# Patient Record
Sex: Female | Born: 1970 | Race: White | Hispanic: No | Marital: Married | Smoking: Current every day smoker
Health system: Southern US, Community
[De-identification: ages and names within clinical notes are randomized; demographics above are authoritative.]

## PROBLEM LIST (undated history)

## (undated) DIAGNOSIS — M5430 Sciatica, unspecified side: Secondary | ICD-10-CM

## (undated) DIAGNOSIS — M1990 Unspecified osteoarthritis, unspecified site: Secondary | ICD-10-CM

## (undated) DIAGNOSIS — I219 Acute myocardial infarction, unspecified: Secondary | ICD-10-CM

## (undated) DIAGNOSIS — F319 Bipolar disorder, unspecified: Secondary | ICD-10-CM

## (undated) DIAGNOSIS — F410 Panic disorder [episodic paroxysmal anxiety] without agoraphobia: Secondary | ICD-10-CM

## (undated) DIAGNOSIS — J45909 Unspecified asthma, uncomplicated: Secondary | ICD-10-CM

## (undated) DIAGNOSIS — J449 Chronic obstructive pulmonary disease, unspecified: Secondary | ICD-10-CM

## (undated) HISTORY — PX: TONSILLECTOMY: SUR1361

## (undated) HISTORY — DX: Acute myocardial infarction, unspecified: I21.9

## (undated) HISTORY — PX: TUBAL LIGATION: SHX77

## (undated) HISTORY — DX: Unspecified osteoarthritis, unspecified site: M19.90

## (undated) HISTORY — PX: OTHER SURGICAL HISTORY: SHX169

---

## 2009-07-02 ENCOUNTER — Ambulatory Visit: Payer: Self-pay | Admitting: Orthopedic Surgery

## 2009-07-02 DIAGNOSIS — M549 Dorsalgia, unspecified: Secondary | ICD-10-CM | POA: Insufficient documentation

## 2009-07-02 DIAGNOSIS — M5430 Sciatica, unspecified side: Secondary | ICD-10-CM

## 2009-07-09 ENCOUNTER — Encounter (HOSPITAL_COMMUNITY): Admission: RE | Admit: 2009-07-09 | Discharge: 2009-08-08 | Payer: Self-pay | Admitting: Orthopedic Surgery

## 2009-07-09 ENCOUNTER — Encounter: Payer: Self-pay | Admitting: Orthopedic Surgery

## 2009-08-19 ENCOUNTER — Encounter (HOSPITAL_COMMUNITY): Admission: RE | Admit: 2009-08-19 | Discharge: 2009-09-18 | Payer: Self-pay | Admitting: Orthopedic Surgery

## 2009-09-04 ENCOUNTER — Encounter: Payer: Self-pay | Admitting: Orthopedic Surgery

## 2010-05-03 DIAGNOSIS — I219 Acute myocardial infarction, unspecified: Secondary | ICD-10-CM

## 2010-05-03 HISTORY — DX: Acute myocardial infarction, unspecified: I21.9

## 2010-06-02 NOTE — Letter (Signed)
Summary: *Orthopedic Consult Note  [facility]  [address]  [place], [place] [address]   Phone: [phone]  Fax: [phone]    Re:    Nichole [name] DOB:    12-19-1970   Dear: Dr. Dimas Aguas and the health department   Thank you for requesting that we see the above patient for consultation.  A copy of the detailed office note will be sent under separate cover, for your review.  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  No surgical treatment needed.       Thank you for this opportunity to look after your patient.  Sincerely,   Terrance Mass. MD.

## 2010-06-02 NOTE — Miscellaneous (Addendum)
Summary: Discharge from PT  Discharage from PT   Imported By: Jacklynn Ganong 09/10/2009 15:31:34  _____________________________________________________________________  External Attachment:    Type:   Image     Comment:   External Document

## 2010-06-02 NOTE — Miscellaneous (Signed)
Summary: PT Initial evaluation  PT Initial evaluation   Imported By: Jacklynn Ganong 07/15/2009 14:13:21  _____________________________________________________________________  External Attachment:    Type:   Image     Comment:   External Document

## 2010-06-02 NOTE — Miscellaneous (Signed)
Summary: PT order  PT order   Imported By: Cammie Sickle 08/25/2009 18:16:04  _____________________________________________________________________  External Attachment:    Type:   Image     Comment:   External Document

## 2010-06-02 NOTE — Assessment & Plan Note (Signed)
Summary: LOW BACK PAIN/NEEDS XRAY/REF HEALTH [name]/[place] MED[place]   Vital Signs:  Patient profile:   40 year old female Weight:      153 pounds Pulse rate:   78 / minute Resp:     18 per minute  Visit Type:  Initial Consult Referring Provider:  Dr. Caryn Bee Howard/health department  CC:  back pain.  History of Present Illness: 40 year old female presents with a five-year history of back pain.  She started having back pain after her tubal ligation in 1995 at which time general spinal anesthetic.  She is numbness and tingling in her RIGHT leg when she crosses her RIGHT leg.  It seems like her feet go to sleep.  She complains of sharp throbbing stabbing burning moderate pain, 7/10, which comes and goes it is worse at night, worse when sitting and worse when standing.  Onset was gradual.  She tried gritty powders BC powders for pain doesn't seem to help.  She does have some catching and locking sensation in the back with numbness and tingling in the leg.  Red flags such as bowel bladder dysfunction are denied.  No fever night sweats or weight loss.   Medications: Lexapro 10mg , Vistaril 50mg .    Past History:  Past Medical History: Anxiety Depression Migraines Bipolar Cyst on right wrist  Past Surgical History: C-section Cyst on right wrist Tonsillectomy Tubal  Review of Systems Constitutional:  Complains of weight gain and fatigue; denies weight loss, fever, and chills. Cardiovascular:  Denies chest pain, palpitations, fainting, and murmurs. Respiratory:  Complains of short of breath and couch; denies wheezing, tightness, pain on inspiration, and snoring . Gastrointestinal:  Denies heartburn, nausea, vomiting, diarrhea, constipation, and blood in your stools. Genitourinary:  Denies frequency, urgency, difficulty urinating, painful urination, flank pain, and bleeding in urine. Neurologic:  Denies numbness, tingling, unsteady gait, dizziness, tremors, and  seizure. Musculoskeletal:  Complains of joint pain and stiffness; denies swelling, instability, redness, heat, and muscle pain. Endocrine:  Denies excessive thirst, exessive urination, and heat or cold intolerance. Psychiatric:  Complains of nervousness, depression, and anxiety; denies hallucinations. Skin:  Denies changes in the skin, poor healing, rash, itching, and redness. HEENT:  Complains of blurred or double vision; denies eye pain, redness, and watering; headache, ears ringing. Immunology:  Complains of seasonal allergies; denies sinus problems and allergic to bee stings. Hemoatologic:  Denies easy bleeding and brusing.  Physical Exam  Additional Exam:   VS reviewed and were normal  GEN: appearance was normal   CDV: normal pulses temperature and no edema  LYMPH nodes were normal   SKIN was normal   Neuro: deep tendon reflexes are 2+ at the knees and at the ankles.  She had decreased sensation in the RIGHT foot compared to the LEFT.  Coordination balance were normal.   Psyche: AAO x 3 and mood was normal   MSKspinal exam  Normal alignment was noted in the spine and the spine was nontender from the cervical until the thoracic region and into the L1-L3 interspaces and then there is tenderness across the lower back at the L4-L5 interspace and on each side of the spine  Range of motion was normal there is no instability  She seemed to have a positive straight leg raise RIGHT compared to LEFT this was a 45 with tension sign  Hip motion was restricted on the RIGHT in terms of flexion she complained of some pain and I did an x-ray which was negative  Normal hip motion on  the LEFT  Knees and ankles and motion was otherwise normal strength was excellent in the lower extremities   *Gait was normal  *Inspection  *ROM *Motor *Stability     Impression & Recommendations:  Problem # 1:  SCIATICA (ICD-724.3)  Her updated medication list for this problem includes:    Aleve  220 Mg Tabs (Naproxen sodium) .Marland Kitchen... 1 by mouth bid  Orders: New Patient Level III (72536) Pelvis x-ray, 1/2 views (72170) Lumbosacral Spine ,2/3 views (72100)  Problem # 2:  BACK PAIN, CHRONIC (ICD-724.5)  Her updated medication list for this problem includes:    Aleve 220 Mg Tabs (Naproxen sodium) .Marland Kitchen... 1 by mouth bid  Orders: New Patient Level III (64403) Pelvis x-ray, 1/2 views (47425) Lumbosacral Spine ,2/3 views (72100) x-ray reports  X-ray of the lumbar spine was ordered  The findings are mild asymmetry in the trunk and the lumbar region Alda spaces were opened there is normal lumbar lordosis and there was no fracture  Impression mild truncal asymmetry lumbar spine otherwise normal  AP pelvis was ordered to evaluate the RIGHT hip  The findings were normal hip joints  Impression normal hips  Medications Added to Medication List This Visit: 1)  Prednisone (pak) 10 Mg Tabs (Prednisone) .... As directed 2)  Aleve 220 Mg Tabs (Naproxen sodium) .Marland Kitchen.. 1 by mouth bid  Patient Instructions: 1)  Most patients (90%) of patients with low back pain will improve with time (2-6 weeks). Limit activity to comfort and avoid activities that increase discomfort.  Apply moist heat and/or ice to lower back and take medication as instructed for pain relief. Please read the Back Pain Handout and start Physical Therapy as directed.  2)  Take the prednisone dose pack fopr 2 weeks then start 1 aleve two times a day for 6 weeks  3)  if you have any further problems, see your doctor at the Health dept.  Prescriptions: ALEVE 220 MG TABS (NAPROXEN SODIUM) 1 by mouth bid  #otc x 0   Entered and Authorized by:   Fuller Canada MD   Signed by:   Fuller Canada MD on 07/02/2009   Method used:   Historical   RxID:   [id number] PREDNISONE (PAK) 10 MG TABS (PREDNISONE) as directed  #1 x 1   Entered and Authorized by:   Fuller Canada MD   Signed by:   Fuller Canada MD on 07/02/2009    Method used:   Print then Give to Patient   RxID:   [id number]

## 2010-06-02 NOTE — Medication Information (Signed)
Summary: Strepred dose pack prescription  Strepred dose pack prescription   Imported By: Jacklynn Ganong 07/07/2009 10:56:37  _____________________________________________________________________  External Attachment:    Type:   Image     Comment:   External Document

## 2010-06-02 NOTE — Letter (Signed)
Summary: History form  History form   Imported By: Jacklynn Ganong 07/08/2009 09:51:48  _____________________________________________________________________  External Attachment:    Type:   Image     Comment:   External Document

## 2012-11-14 ENCOUNTER — Emergency Department (HOSPITAL_COMMUNITY): Payer: Self-pay

## 2012-11-14 ENCOUNTER — Emergency Department (HOSPITAL_COMMUNITY)
Admission: EM | Admit: 2012-11-14 | Discharge: 2012-11-14 | Disposition: A | Discharge status: Home / Self Care | Payer: Self-pay | Attending: Emergency Medicine | Admitting: Emergency Medicine

## 2012-11-14 ENCOUNTER — Encounter (HOSPITAL_COMMUNITY): Payer: Self-pay

## 2012-11-14 DIAGNOSIS — Y9301 Activity, walking, marching and hiking: Secondary | ICD-10-CM | POA: Insufficient documentation

## 2012-11-14 DIAGNOSIS — S2231XA Fracture of one rib, right side, initial encounter for closed fracture: Secondary | ICD-10-CM

## 2012-11-14 DIAGNOSIS — Y929 Unspecified place or not applicable: Secondary | ICD-10-CM | POA: Insufficient documentation

## 2012-11-14 DIAGNOSIS — F17200 Nicotine dependence, unspecified, uncomplicated: Secondary | ICD-10-CM | POA: Insufficient documentation

## 2012-11-14 DIAGNOSIS — Z79899 Other long term (current) drug therapy: Secondary | ICD-10-CM | POA: Insufficient documentation

## 2012-11-14 DIAGNOSIS — S2239XA Fracture of one rib, unspecified side, initial encounter for closed fracture: Secondary | ICD-10-CM | POA: Insufficient documentation

## 2012-11-14 DIAGNOSIS — F319 Bipolar disorder, unspecified: Secondary | ICD-10-CM | POA: Insufficient documentation

## 2012-11-14 DIAGNOSIS — W108XXA Fall (on) (from) other stairs and steps, initial encounter: Secondary | ICD-10-CM | POA: Insufficient documentation

## 2012-11-14 HISTORY — DX: Bipolar disorder, unspecified: F31.9

## 2012-11-14 HISTORY — DX: Panic disorder (episodic paroxysmal anxiety): F41.0

## 2012-11-14 MED ORDER — HYDROCODONE-ACETAMINOPHEN 5-325 MG PO TABS
1.0000 | ORAL_TABLET | Freq: Once | ORAL | Status: AC
  Administered 2012-11-14: 1 via ORAL
  Filled 2012-11-14: qty 1

## 2012-11-14 MED ORDER — HYDROCODONE-ACETAMINOPHEN 5-325 MG PO TABS: 1.0000 | ORAL_TABLET | ORAL | Status: DC | PRN

## 2012-11-14 NOTE — ED Provider Notes (Signed)
History    CSN: [id number] Arrival date & time 11/14/12  1830  First MD Initiated Contact with Patient 11/14/12 1915     Chief Complaint  Patient presents with  . Fall   (Consider location/radiation/quality/duration/timing/severity/associated sxs/prior Treatment) Patient is a 42 y.o. female presenting with fall. The history is provided by the patient. No language interpreter was used.  Fall This is a new (Patient fell on steps about 8 AM today, injuring her right anterior rib cage. She has pain when she coughs or takes a deep breath. There is no hemoptysis and no shortness of breath.) problem. The current episode started 6 to 12 hours ago. The problem occurs constantly. The problem has not changed since onset.Nothing aggravates the symptoms. Nothing relieves the symptoms. She has tried nothing for the symptoms.   Past Medical History  Diagnosis Date  . Bipolar 1 disorder   . Panic attacks    Past Surgical History  Procedure Laterality Date  . Tonsillectomy    . Cesarean section     No family history on file. History  Substance Use Topics  . Smoking status: Current Every Day Smoker  . Smokeless tobacco: Not on file  . Alcohol Use: No   OB History   Grav Para Term Preterm Abortions TAB SAB Ect Mult Living                 Review of Systems  Constitutional: Negative for fever and chills.  Eyes: Negative.   Respiratory:       Pain in right anterior chest.  Cardiovascular: Negative.   Gastrointestinal: Negative.   Genitourinary: Negative.   Musculoskeletal: Negative.   Skin: Negative.   Neurological: Negative.   Psychiatric/Behavioral: Negative.     Allergies  Percocet  Home Medications   Current Outpatient Rx  Name  Route  Sig  Dispense  Refill  . citalopram (CELEXA) 20 MG tablet   Oral   Take 20 mg by mouth daily.         . diazepam (VALIUM) 5 MG tablet   Oral   Take 5 mg by mouth 2 (two) times daily.         Marland Kitchen HYDROcodone-acetaminophen  (NORCO/VICODIN) 5-325 MG per tablet   Oral   Take 1 tablet by mouth every 4 (four) hours as needed for pain.   20 tablet   0    BP 127/70  Pulse 101  Temp(Src) 98 F (36.7 C) (Oral)  Resp 20  Ht 5\' 8"  (1.727 m)  Wt 183 lb (83.008 kg)  BMI 27.83 kg/m2  SpO2 96%  LMP 10/31/2012 Physical Exam  Nursing note and vitals reviewed. Constitutional: She is oriented to person, place, and time. She appears well-developed and well-nourished.  In mild to moderate distress with pain in her right anterior rib cage at the costal margin.  HENT:  Head: Normocephalic and atraumatic.  Right Ear: External ear normal.  Left Ear: External ear normal.  Mouth/Throat: Oropharynx is clear and moist.  Eyes: Conjunctivae and EOM are normal. Pupils are equal, round, and reactive to light. No scleral icterus.  Neck: Normal range of motion. Neck supple.  Cardiovascular: Normal rate, regular rhythm and normal heart sounds.   Pulmonary/Chest: Effort normal and breath sounds normal. Tenderness: she localizes the pain to the right anterior chest below her right breast, along the right costal margin. There is no crepitance or subcutaneous emphysema, no palpable bony deformity. She has mild tenderness to palpation in this area.  Abdominal: She  exhibits no distension. There is no tenderness.  Musculoskeletal: Normal range of motion. She exhibits no tenderness.  Neurological: She is alert and oriented to person, place, and time.  No sensory or motor deficit.  Skin: Skin is warm and dry.  Psychiatric: She has a normal mood and affect. Her behavior is normal.    ED Course  Procedures (including critical care time) Labs Reviewed - No data to display Dg Ribs Unilateral W/chest Right  11/14/2012   *RADIOLOGY REPORT*  Clinical Data: Anterior rib pain.  Pleuritic chest pain.  Fall.  RIGHT RIBS AND CHEST - 3+ VIEW  Comparison: None.  Findings: No pneumothorax or pleural effusion observed. Cardiac and mediastinal contours  appear normal.  The lungs appear clear.  No pleural effusion is identified.  Probable nondisplaced fracture of the right tenth rib anteriorly.  IMPRESSION:  1.  Probable nondisplaced rib fracture of the right anterior 10th rib.  Otherwise negative exam.   Original Report Authenticated By: Gaylyn Rong, M.D.   Advised pt about rib fracture treatment, Rx hydrocodone-acetaminophen q4h prn pain, advised not to smoke as coughing would make her pain worse.  1. Rib fracture, right, closed, initial encounter      Carleene Cooper III, MD 11/14/12 [phone]

## 2012-11-14 NOTE — ED Notes (Signed)
Pt reports tripped walking up some stairs and fell on porch.  C/O pain to r rib area.   Says hurts to take a deep breath but denies SOB.

## 2012-11-22 ENCOUNTER — Other Ambulatory Visit (HOSPITAL_COMMUNITY): Payer: Self-pay | Admitting: *Deleted

## 2012-11-22 ENCOUNTER — Encounter (HOSPITAL_COMMUNITY): Payer: Self-pay | Admitting: Emergency Medicine

## 2012-11-22 ENCOUNTER — Emergency Department (HOSPITAL_COMMUNITY): Payer: Self-pay

## 2012-11-22 ENCOUNTER — Emergency Department (HOSPITAL_COMMUNITY)
Admission: EM | Admit: 2012-11-22 | Discharge: 2012-11-22 | Disposition: A | Discharge status: Home / Self Care | Payer: Self-pay | Attending: Emergency Medicine | Admitting: Emergency Medicine

## 2012-11-22 DIAGNOSIS — Z79899 Other long term (current) drug therapy: Secondary | ICD-10-CM | POA: Insufficient documentation

## 2012-11-22 DIAGNOSIS — S2231XS Fracture of one rib, right side, sequela: Secondary | ICD-10-CM

## 2012-11-22 DIAGNOSIS — Z760 Encounter for issue of repeat prescription: Secondary | ICD-10-CM | POA: Insufficient documentation

## 2012-11-22 DIAGNOSIS — R0789 Other chest pain: Secondary | ICD-10-CM | POA: Insufficient documentation

## 2012-11-22 DIAGNOSIS — G8921 Chronic pain due to trauma: Secondary | ICD-10-CM | POA: Insufficient documentation

## 2012-11-22 DIAGNOSIS — F410 Panic disorder [episodic paroxysmal anxiety] without agoraphobia: Secondary | ICD-10-CM | POA: Insufficient documentation

## 2012-11-22 DIAGNOSIS — R059 Cough, unspecified: Secondary | ICD-10-CM | POA: Insufficient documentation

## 2012-11-22 DIAGNOSIS — F319 Bipolar disorder, unspecified: Secondary | ICD-10-CM | POA: Insufficient documentation

## 2012-11-22 DIAGNOSIS — F17200 Nicotine dependence, unspecified, uncomplicated: Secondary | ICD-10-CM | POA: Insufficient documentation

## 2012-11-22 DIAGNOSIS — R05 Cough: Secondary | ICD-10-CM | POA: Insufficient documentation

## 2012-11-22 DIAGNOSIS — Z139 Encounter for screening, unspecified: Secondary | ICD-10-CM

## 2012-11-22 MED ORDER — HYDROCODONE-ACETAMINOPHEN 5-325 MG PO TABS
1.0000 | ORAL_TABLET | Freq: Once | ORAL | Status: AC
  Administered 2012-11-22: 1 via ORAL
  Filled 2012-11-22: qty 1

## 2012-11-22 MED ORDER — HYDROCODONE-ACETAMINOPHEN 5-325 MG PO TABS: 1.0000 | ORAL_TABLET | ORAL | Status: DC | PRN

## 2012-11-22 NOTE — ED Notes (Signed)
Pt c/o right side rib pain x1 week. Pt was seen here approximately 1 week ago after a fall and diagnosed with a rib fracture. Pt was given pain meds for home but states she is now out of her meds. Pt still reports pain and is here for medication refill.

## 2012-11-22 NOTE — ED Notes (Signed)
States that she was seen here in the emergency department and diagnosed with a broken right rib.  States that she is out of her pain medication and requires more.  States that she does not have a primary care physician.

## 2012-11-25 NOTE — ED Provider Notes (Signed)
CSN: [id number]     Arrival date & time 11/22/12  1132 History     First MD Initiated Contact with Patient 11/22/12 1141     Chief Complaint  Patient presents with  . Medication Refill   (Consider location/radiation/quality/duration/timing/severity/associated sxs/prior Treatment) HPI Comments: Nichole [name] is a 42 y.o. Female presenting with continued right rib pain after being diagnosed with a right rib fracture last week sustained in a fall against steps.  She has run out of her pain medication, hydrocodone which was improving her pain. She denies shortness of breath but continues to have pain with deep inspiration.  She does report a chronic smokers cough which is not increased since this injury, but has noticed a few episodes of pink tinged sputum. She has no fevers or chills.     The history is provided by the patient and the spouse.    Past Medical History  Diagnosis Date  . Bipolar 1 disorder   . Panic attacks    Past Surgical History  Procedure Laterality Date  . Tonsillectomy    . Cesarean section     No family history on file. History  Substance Use Topics  . Smoking status: Current Every Day Smoker  . Smokeless tobacco: Not on file  . Alcohol Use: No   OB History   Grav Para Term Preterm Abortions TAB SAB Ect Mult Living                 Review of Systems  Constitutional: Negative for fever and chills.  HENT: Negative for congestion, sore throat and neck pain.   Eyes: Negative.   Respiratory: Positive for cough. Negative for shortness of breath, wheezing and stridor.   Cardiovascular: Positive for chest pain.  Gastrointestinal: Negative for nausea and abdominal pain.  Genitourinary: Negative.   Musculoskeletal: Negative for joint swelling and arthralgias.  Skin: Negative.  Negative for wound.  Neurological: Negative for dizziness, weakness and headaches.  Psychiatric/Behavioral: Negative.     Allergies  Percocet  Home Medications   Current  Outpatient Rx  Name  Route  Sig  Dispense  Refill  . citalopram (CELEXA) 20 MG tablet   Oral   Take 20 mg by mouth daily.         . diazepam (VALIUM) 5 MG tablet   Oral   Take 5 mg by mouth 2 (two) times daily.         Marland Kitchen HYDROcodone-acetaminophen (NORCO/VICODIN) 5-325 MG per tablet   Oral   Take 1 tablet by mouth every 4 (four) hours as needed for pain.   20 tablet   0   . HYDROcodone-acetaminophen (NORCO/VICODIN) 5-325 MG per tablet   Oral   Take 1 tablet by mouth every 4 (four) hours as needed for pain.   30 tablet   0    BP 114/80  Pulse 116  Temp(Src) 97.8 F (36.6 C) (Oral)  Resp 18  Ht 5\' 8"  (1.727 m)  Wt 193 lb (87.544 kg)  BMI 29.35 kg/m2  SpO2 99%  LMP 11/16/2012 Physical Exam  Nursing note and vitals reviewed. Constitutional: She appears well-developed and well-nourished.  HENT:  Head: Normocephalic and atraumatic.  Eyes: Conjunctivae are normal.  Neck: Normal range of motion.  Cardiovascular: Normal rate, regular rhythm, normal heart sounds and intact distal pulses.   Pulmonary/Chest: Effort normal and breath sounds normal. No respiratory distress. She has no decreased breath sounds. She has no wheezes. She has no rhonchi. She has no rales. She  exhibits tenderness. She exhibits no crepitus, no deformity, no swelling and no retraction.    Abdominal: Soft. Bowel sounds are normal. There is no tenderness.  Musculoskeletal: Normal range of motion.  Neurological: She is alert.  Skin: Skin is warm and dry.  Psychiatric: She has a normal mood and affect.    ED Course   Procedures (including critical care time)  Labs Reviewed - No data to display No results found. 1. Rib fracture, right, sequela     MDM  Patients labs and/or radiological studies were viewed and considered during the medical decision making and disposition process. No evidence of developing pneumonia per xray,  Incentive spirometer given.  Hydrocodone prescribed.  Discussed  signs/symptoms for returning here including sob, fever, coughing up increased blood.  Discussed with Dr. Deretha Emory prior to pt dispo home.  Burgess Amor, PA-C 11/25/12 1123

## 2012-12-03 NOTE — ED Provider Notes (Signed)
Medical screening examination/treatment/procedure(s) were performed by non-physician practitioner and as supervising physician I was immediately available for consultation/collaboration.   Shelda Jakes, MD 12/03/12 [phone]

## 2012-12-15 ENCOUNTER — Ambulatory Visit (HOSPITAL_COMMUNITY)
Admission: RE | Admit: 2012-12-15 | Discharge: 2012-12-15 | Disposition: A | Discharge status: Home / Self Care | Payer: Self-pay | Source: Ambulatory Visit | Attending: *Deleted | Admitting: *Deleted

## 2012-12-15 DIAGNOSIS — Z139 Encounter for screening, unspecified: Secondary | ICD-10-CM

## 2012-12-19 ENCOUNTER — Other Ambulatory Visit: Payer: Self-pay | Admitting: *Deleted

## 2012-12-19 DIAGNOSIS — R928 Other abnormal and inconclusive findings on diagnostic imaging of breast: Secondary | ICD-10-CM

## 2013-01-09 ENCOUNTER — Other Ambulatory Visit: Payer: Self-pay

## 2013-03-08 ENCOUNTER — Emergency Department (HOSPITAL_COMMUNITY)
Admission: EM | Admit: 2013-03-08 | Discharge: 2013-03-08 | Disposition: A | Discharge status: Home / Self Care | Payer: Self-pay | Attending: Emergency Medicine | Admitting: Emergency Medicine

## 2013-03-08 ENCOUNTER — Encounter (HOSPITAL_COMMUNITY): Payer: Self-pay | Admitting: Emergency Medicine

## 2013-03-08 ENCOUNTER — Other Ambulatory Visit: Payer: Self-pay

## 2013-03-08 DIAGNOSIS — Z79899 Other long term (current) drug therapy: Secondary | ICD-10-CM | POA: Insufficient documentation

## 2013-03-08 DIAGNOSIS — F17200 Nicotine dependence, unspecified, uncomplicated: Secondary | ICD-10-CM | POA: Insufficient documentation

## 2013-03-08 DIAGNOSIS — X500XXA Overexertion from strenuous movement or load, initial encounter: Secondary | ICD-10-CM | POA: Insufficient documentation

## 2013-03-08 DIAGNOSIS — Z791 Long term (current) use of non-steroidal anti-inflammatories (NSAID): Secondary | ICD-10-CM | POA: Insufficient documentation

## 2013-03-08 DIAGNOSIS — S46911A Strain of unspecified muscle, fascia and tendon at shoulder and upper arm level, right arm, initial encounter: Secondary | ICD-10-CM

## 2013-03-08 DIAGNOSIS — Y929 Unspecified place or not applicable: Secondary | ICD-10-CM | POA: Insufficient documentation

## 2013-03-08 DIAGNOSIS — IMO0002 Reserved for concepts with insufficient information to code with codable children: Secondary | ICD-10-CM | POA: Insufficient documentation

## 2013-03-08 DIAGNOSIS — F410 Panic disorder [episodic paroxysmal anxiety] without agoraphobia: Secondary | ICD-10-CM | POA: Insufficient documentation

## 2013-03-08 DIAGNOSIS — F319 Bipolar disorder, unspecified: Secondary | ICD-10-CM | POA: Insufficient documentation

## 2013-03-08 DIAGNOSIS — Y9389 Activity, other specified: Secondary | ICD-10-CM | POA: Insufficient documentation

## 2013-03-08 MED ORDER — BACLOFEN 10 MG PO TABS: 10.0000 mg | ORAL_TABLET | Freq: Three times a day (TID) | ORAL | Status: DC

## 2013-03-08 MED ORDER — HYDROCODONE-ACETAMINOPHEN 5-325 MG PO TABS: ORAL_TABLET | ORAL | Status: DC

## 2013-03-08 MED ORDER — DICLOFENAC SODIUM 75 MG PO TBEC: 75.0000 mg | DELAYED_RELEASE_TABLET | Freq: Two times a day (BID) | ORAL | Status: DC

## 2013-03-08 NOTE — ED Notes (Signed)
Pain rt upper back and around to chest and rt arm. Says she has been lifting   A family  Member that is ill. And thinks that may be the cause of the pain

## 2013-03-08 NOTE — ED Provider Notes (Signed)
CSN: [id number]     Arrival date & time 03/08/13  1304 History   First MD Initiated Contact with Patient 03/08/13 1417     Chief Complaint  Patient presents with  . Back Pain   (Consider location/radiation/quality/duration/timing/severity/associated sxs/prior Treatment) HPI Comments:  Patient is a 42 year old female who presents to the emergency department with upper back pain that radiates under the right arm and into the right upper chest. The patient states that she has been helping to lift a family member who has been sick with multiple medical problems and now she is having pain in the right shoulder and side. The pain is worse with activity. Patient has not had any previous operations or procedures involving the right arm or chest area. The been no recent changes in medications. Patient denies dropping anything or having other problems.  Patient is a 42 y.o. female presenting with back pain. The history is provided by the patient.  Back Pain Associated symptoms: no abdominal pain, no chest pain and no dysuria     Past Medical History  Diagnosis Date  . Bipolar 1 disorder   . Panic attacks    Past Surgical History  Procedure Laterality Date  . Tonsillectomy    . Cesarean section     History reviewed. No pertinent family history. History  Substance Use Topics  . Smoking status: Current Every Day Smoker  . Smokeless tobacco: Not on file  . Alcohol Use: No   OB History   Grav Para Term Preterm Abortions TAB SAB Ect Mult Living                 Review of Systems  Constitutional: Negative for activity change.       All ROS Neg except as noted in HPI  HENT: Negative for nosebleeds.   Eyes: Negative for photophobia and discharge.  Respiratory: Negative for cough, shortness of breath and wheezing.   Cardiovascular: Negative for chest pain and palpitations.  Gastrointestinal: Negative for abdominal pain and blood in stool.  Genitourinary: Negative for dysuria, frequency and  hematuria.  Musculoskeletal: Positive for back pain. Negative for arthralgias and neck pain.  Skin: Negative.   Neurological: Negative for dizziness, seizures and speech difficulty.  Psychiatric/Behavioral: Negative for hallucinations and confusion. The patient is nervous/anxious.     Allergies  Percocet  Home Medications   Current Outpatient Rx  Name  Route  Sig  Dispense  Refill  . citalopram (CELEXA) 20 MG tablet   Oral   Take 20 mg by mouth daily.         . diazepam (VALIUM) 5 MG tablet   Oral   Take 5 mg by mouth daily.          . baclofen (LIORESAL) 10 MG tablet   Oral   Take 1 tablet (10 mg total) by mouth 3 (three) times daily.   21 each   0   . diclofenac (VOLTAREN) 75 MG EC tablet   Oral   Take 1 tablet (75 mg total) by mouth 2 (two) times daily.   12 tablet   0   . HYDROcodone-acetaminophen (NORCO) 5-325 MG per tablet      1 po q4h prn pain   15 tablet   0    BP 114/69  Pulse 99  Temp(Src) 97.6 F (36.4 C) (Oral)  Resp 20  Ht 5\' 8"  (1.727 m)  Wt 175 lb (79.379 kg)  BMI 26.61 kg/m2  SpO2 98%  LMP 02/26/2013 Physical Exam  Nursing note and vitals reviewed. Constitutional: She is oriented to person, place, and time. She appears well-developed and well-nourished.  Non-toxic appearance.  HENT:  Head: Normocephalic.  Right Ear: Tympanic membrane and external ear normal.  Left Ear: Tympanic membrane and external ear normal.  Eyes: EOM and lids are normal. Pupils are equal, round, and reactive to light.  Neck: Normal range of motion. Neck supple. Carotid bruit is not present.  Cardiovascular: Normal rate, regular rhythm, normal heart sounds, intact distal pulses and normal pulses.   Pulmonary/Chest: Breath sounds normal. No respiratory distress.  There is right chest wall pain to palpation. There is mild lower sternal area pain to palpation. There is no palpable deformity of the rib area or the sternal area.  Abdominal: Soft. Bowel sounds are  normal. There is no tenderness. There is no guarding.  Musculoskeletal: Normal range of motion.  There is soreness of the right shoulder to palpation and with range of motion. There is pain under the scapula. There is pain to palpation in the right flank under the axilla.  Lymphadenopathy:       Head (right side): No submandibular adenopathy present.       Head (left side): No submandibular adenopathy present.    She has no cervical adenopathy.  Neurological: She is alert and oriented to person, place, and time. She has normal strength. No cranial nerve deficit or sensory deficit.  Skin: Skin is warm and dry.  Psychiatric: She has a normal mood and affect. Her speech is normal.    ED Course  Procedures (including critical care time) Labs Review Labs Reviewed - No data to display Imaging Review No results found.  EKG Interpretation   None       MDM   1. Shoulder strain, right, initial encounter    *I have reviewed nursing notes, vital signs, and all appropriate lab and imaging results for this patient.**  Patient has been lifting a family member who is been ill recently. She now has pain under the right scapula area extending under the axilla area and into the right chest area. The lungs are clear. The pulse oximetry is within good range. There is no high fever appreciated. There's been no recent surgery or procedures involving the right chest or back.  Suspect the patient has a muscle strain involving this area. The patient will be treated with baclofen and Norco. Patient advised to rest this area for 2-3 days. Patient also advised to see the adult medicine clinic for medical care until she can establish a primary care physician.  Kathie Dike, PA-C 03/08/13 1437

## 2013-03-08 NOTE — ED Provider Notes (Signed)
Medical screening examination/treatment/procedure(s) were performed by non-physician practitioner and as supervising physician I was immediately available for consultation/collaboration.  EKG Interpretation   None         Shelda Jakes, MD 03/08/13 [phone]

## 2013-03-17 ENCOUNTER — Encounter (HOSPITAL_COMMUNITY): Payer: Self-pay | Admitting: Emergency Medicine

## 2013-03-17 ENCOUNTER — Emergency Department (HOSPITAL_COMMUNITY)
Admission: EM | Admit: 2013-03-17 | Discharge: 2013-03-17 | Disposition: A | Discharge status: Home / Self Care | Payer: Self-pay | Attending: Emergency Medicine | Admitting: Emergency Medicine

## 2013-03-17 DIAGNOSIS — F17200 Nicotine dependence, unspecified, uncomplicated: Secondary | ICD-10-CM | POA: Insufficient documentation

## 2013-03-17 DIAGNOSIS — F319 Bipolar disorder, unspecified: Secondary | ICD-10-CM | POA: Insufficient documentation

## 2013-03-17 DIAGNOSIS — M25519 Pain in unspecified shoulder: Secondary | ICD-10-CM | POA: Insufficient documentation

## 2013-03-17 DIAGNOSIS — G8911 Acute pain due to trauma: Secondary | ICD-10-CM | POA: Insufficient documentation

## 2013-03-17 DIAGNOSIS — M25511 Pain in right shoulder: Secondary | ICD-10-CM

## 2013-03-17 DIAGNOSIS — Z79899 Other long term (current) drug therapy: Secondary | ICD-10-CM | POA: Insufficient documentation

## 2013-03-17 DIAGNOSIS — F410 Panic disorder [episodic paroxysmal anxiety] without agoraphobia: Secondary | ICD-10-CM | POA: Insufficient documentation

## 2013-03-17 DIAGNOSIS — R52 Pain, unspecified: Secondary | ICD-10-CM | POA: Insufficient documentation

## 2013-03-17 MED ORDER — DIAZEPAM 5 MG PO TABS
5.0000 mg | ORAL_TABLET | Freq: Once | ORAL | Status: AC
  Administered 2013-03-17: 5 mg via ORAL
  Filled 2013-03-17: qty 1

## 2013-03-17 MED ORDER — DIAZEPAM 5 MG PO TABS: ORAL_TABLET | ORAL | Status: DC

## 2013-03-17 MED ORDER — HYDROCODONE-ACETAMINOPHEN 5-325 MG PO TABS
1.0000 | ORAL_TABLET | Freq: Once | ORAL | Status: AC
  Administered 2013-03-17: 1 via ORAL
  Filled 2013-03-17: qty 1

## 2013-03-17 MED ORDER — HYDROCODONE-ACETAMINOPHEN 5-325 MG PO TABS: 1.0000 | ORAL_TABLET | Freq: Four times a day (QID) | ORAL | Status: DC | PRN

## 2013-03-17 MED ORDER — DICLOFENAC SODIUM 75 MG PO TBEC: 75.0000 mg | DELAYED_RELEASE_TABLET | Freq: Two times a day (BID) | ORAL | Status: DC

## 2013-03-17 MED ORDER — BACLOFEN 10 MG PO TABS: 10.0000 mg | ORAL_TABLET | Freq: Three times a day (TID) | ORAL | Status: DC
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
## 2013-04-03 ENCOUNTER — Emergency Department (HOSPITAL_COMMUNITY): Payer: Self-pay

## 2013-04-03 ENCOUNTER — Encounter (HOSPITAL_COMMUNITY): Payer: Self-pay | Admitting: Emergency Medicine

## 2013-04-03 ENCOUNTER — Observation Stay (HOSPITAL_COMMUNITY)
Admission: EM | Admit: 2013-04-03 | Discharge: 2013-04-04 | Disposition: A | Discharge status: Home / Self Care | Payer: Self-pay | Attending: Internal Medicine | Admitting: Internal Medicine

## 2013-04-03 DIAGNOSIS — F411 Generalized anxiety disorder: Secondary | ICD-10-CM

## 2013-04-03 DIAGNOSIS — M549 Dorsalgia, unspecified: Secondary | ICD-10-CM | POA: Insufficient documentation

## 2013-04-03 DIAGNOSIS — R079 Chest pain, unspecified: Principal | ICD-10-CM | POA: Diagnosis present

## 2013-04-03 DIAGNOSIS — G8929 Other chronic pain: Secondary | ICD-10-CM | POA: Insufficient documentation

## 2013-04-03 DIAGNOSIS — M5430 Sciatica, unspecified side: Secondary | ICD-10-CM | POA: Insufficient documentation

## 2013-04-03 LAB — CBC WITH DIFFERENTIAL/PLATELET
Eosinophils Absolute: 0.2 10*3/uL (ref 0.0–0.7)
Hemoglobin: 13.7 g/dL (ref 12.0–15.0)
Lymphocytes Relative: 23 % (ref 12–46)
Lymphs Abs: 1.8 10*3/uL (ref 0.7–4.0)
MCH: 31.6 pg (ref 26.0–34.0)
MCV: 92.6 fL (ref 78.0–100.0)
Monocytes Relative: 5 % (ref 3–12)
Neutrophils Relative %: 70 % (ref 43–77)
RBC: 4.33 MIL/uL (ref 3.87–5.11)
WBC: 7.7 10*3/uL (ref 4.0–10.5)

## 2013-04-03 LAB — BASIC METABOLIC PANEL
CO2: 24 mEq/L (ref 19–32)
Calcium: 9.4 mg/dL (ref 8.4–10.5)
Chloride: 104 mEq/L (ref 96–112)
Glucose, Bld: 108 mg/dL — ABNORMAL HIGH (ref 70–99)
Potassium: 3.3 mEq/L — ABNORMAL LOW (ref 3.5–5.1)
Sodium: 140 mEq/L (ref 135–145)

## 2013-04-03 LAB — TROPONIN I
Troponin I: <0.3 ng/mL (ref <0.30)
Troponin I: <0.3 ng/mL (ref <0.30)

## 2013-04-03 LAB — D-DIMER, QUANTITATIVE: D-Dimer, Quant: <0.27 ug/mL-FEU (ref 0.00–0.48)

## 2013-04-03 MED ORDER — POTASSIUM CHLORIDE 20 MEQ PO PACK
PACK | ORAL | Status: AC
  Filled 2013-04-03: qty 1

## 2013-04-03 MED ORDER — DIAZEPAM 5 MG PO TABS: 5.0000 mg | ORAL_TABLET | Freq: Two times a day (BID) | ORAL | Status: DC | PRN

## 2013-04-03 MED ORDER — NITROGLYCERIN 0.4 MG SL SUBL
0.4000 mg | SUBLINGUAL_TABLET | SUBLINGUAL | Status: DC | PRN
  Administered 2013-04-03: 0.4 mg via SUBLINGUAL
  Filled 2013-04-03: qty 25

## 2013-04-03 MED ORDER — CITALOPRAM HYDROBROMIDE 20 MG PO TABS
20.0000 mg | ORAL_TABLET | Freq: Every day | ORAL | Status: DC
  Administered 2013-04-04: 20 mg via ORAL
  Filled 2013-04-03: qty 1

## 2013-04-03 MED ORDER — POTASSIUM CHLORIDE 20 MEQ PO PACK
40.0000 meq | PACK | Freq: Once | ORAL | Status: AC
  Administered 2013-04-03: 40 meq via ORAL
  Filled 2013-04-03: qty 2

## 2013-04-03 MED ORDER — BACLOFEN 10 MG PO TABS
10.0000 mg | ORAL_TABLET | Freq: Three times a day (TID) | ORAL | Status: DC
  Administered 2013-04-03 – 2013-04-04 (×3): 10 mg via ORAL
  Filled 2013-04-03 (×3): qty 1

## 2013-04-03 MED ORDER — NITROGLYCERIN 0.4 MG SL SUBL: 0.4000 mg | SUBLINGUAL_TABLET | SUBLINGUAL | Status: DC | PRN

## 2013-04-03 MED ORDER — ASPIRIN EC 81 MG PO TBEC
81.0000 mg | DELAYED_RELEASE_TABLET | Freq: Every day | ORAL | Status: DC
  Administered 2013-04-04: 81 mg via ORAL
  Filled 2013-04-03: qty 1

## 2013-04-03 MED ORDER — ALPRAZOLAM 0.5 MG PO TABS: 0.5000 mg | ORAL_TABLET | Freq: Three times a day (TID) | ORAL | Status: DC | PRN

## 2013-04-03 MED ORDER — ASPIRIN 81 MG PO CHEW
324.0000 mg | CHEWABLE_TABLET | Freq: Once | ORAL | Status: AC
  Administered 2013-04-03: 324 mg via ORAL
  Filled 2013-04-03: qty 4

## 2013-04-03 MED ORDER — ONDANSETRON HCL 4 MG PO TABS: 4.0000 mg | ORAL_TABLET | Freq: Four times a day (QID) | ORAL | Status: DC | PRN

## 2013-04-03 MED ORDER — ASPIRIN EC 81 MG PO TBEC: 81.0000 mg | DELAYED_RELEASE_TABLET | Freq: Every day | ORAL | Status: DC

## 2013-04-03 MED ORDER — SODIUM CHLORIDE 0.9 % IJ SOLN
3.0000 mL | Freq: Two times a day (BID) | INTRAMUSCULAR | Status: DC
  Administered 2013-04-03 – 2013-04-04 (×2): 3 mL via INTRAVENOUS

## 2013-04-03 MED ORDER — HEPARIN SODIUM (PORCINE) 5000 UNIT/ML IJ SOLN
5000.0000 [IU] | Freq: Three times a day (TID) | INTRAMUSCULAR | Status: DC
  Administered 2013-04-03 – 2013-04-04 (×3): 5000 [IU] via SUBCUTANEOUS
  Filled 2013-04-03 (×3): qty 1

## 2013-04-03 MED ORDER — ONDANSETRON HCL 4 MG/2ML IJ SOLN: 4.0000 mg | Freq: Four times a day (QID) | INTRAMUSCULAR | Status: DC | PRN

## 2013-04-03 MED ORDER — IBUPROFEN 800 MG PO TABS: 400.0000 mg | ORAL_TABLET | Freq: Three times a day (TID) | ORAL | Status: DC | PRN

## 2013-04-03 MED ORDER — MORPHINE SULFATE 2 MG/ML IJ SOLN: 2.0000 mg | INTRAMUSCULAR | Status: DC | PRN

## 2013-04-03 NOTE — H&P (Signed)
[facility] History and Physical  Nichole [name] ZOX:[id number] DOB: 18-Nov-1970 DOA: 04/03/2013  Referring physician: Emergency department PCP: No PCP Per Patient  Specialists:   Chief Complaint: Chest pain, palpitations  HPI: Nichole [name] is a 42 y.o. female  Tobacco abuser with a hx of anxiety who presents to the ED from [facility] with tachycardia and intermittent chest pains. In the ED, the patient was found to have cardiac enzymes that were neg x1. An EKG demonstrated normal sinus rhythm. The patient was given one dose of ntg with resolution of CP within minutes. In addition, the patient noted having a strong family history of heart disease. Consequently, the hospitalists were consulted for admission.  Of note, pt's family member has been sick in the ICU for the past week. Pt admits to feeling stressed during this time and not sleeping well.  Review of Systems: Per above, the remainder of the 10pt ros reviewed and are neg  Past Medical History  Diagnosis Date  . Bipolar 1 disorder   . Panic attacks    Past Surgical History  Procedure Laterality Date  . Tonsillectomy    . Cesarean section     Social History:  reports that she has been smoking Cigarettes.  She has been smoking about 0.00 packs per day. She does not have any smokeless tobacco history on file. She reports that she does not drink alcohol or use illicit drugs.  where does patient live--home, ALF, SNF? and with whom if at home?  Can patient participate in ADLs?  Allergies  Allergen Reactions  . Percocet [Oxycodone-Acetaminophen]     headache    No family history on file.  (be sure to complete)  Prior to Admission medications   Medication Sig Start Date End Date Taking? Authorizing Provider  baclofen (LIORESAL) 10 MG tablet Take 1 tablet (10 mg total) by mouth 3 (three) times daily. 03/17/13  Yes [name], MD  citalopram (CELEXA) 20 MG tablet Take 20 mg by mouth daily.   Yes Historical Provider, MD   diazepam (VALIUM) 5 MG tablet Take one pill every 12 hours as needed for nerves 03/17/13  Yes [name], MD  ibuprofen (ADVIL,MOTRIN) 400 MG tablet Take 400 mg by mouth every 8 (eight) hours as needed.   Yes Historical Provider, MD   Physical Exam: Filed Vitals:   04/03/13 1143 04/03/13 1410  BP: 113/72 121/78  Pulse:  73  Temp: 98.7 F (37.1 C)   TempSrc: Oral   Resp: 20 20  Height: 5\' 8"  (1.727 m)   Weight: 79.379 kg (175 lb)   SpO2: 98% 98%     General:  Awake, in nad  Eyes: PERRL B  ENT: membranes moist, dentition fair  Neck: trachea midline, neck supple  Cardiovascular: regular, s1, s2  Respiratory: normal resp effort, no wheezing  Abdomen: soft, nondistended  Skin: normal skin turgor, no abnormal skin lesions seen  Musculoskeletal: perfused, no clubbing  Psychiatric: appears anxious, no auditory/visual hallucinations  Neurologic: cn2-12 grossly intact, strength/sensation intact  Labs on Admission:  Basic Metabolic Panel:  Recent Labs Lab 04/03/13 1158  NA 140  K 3.3*  CL 104  CO2 24  GLUCOSE 108*  BUN 11  CREATININE 0.76  CALCIUM 9.4   Liver Function Tests: No results found for this basename: AST, ALT, ALKPHOS, BILITOT, PROT, ALBUMIN,  in the last 168 hours No results found for this basename: LIPASE, AMYLASE,  in the last 168 hours No results found for this  basename: AMMONIA,  in the last 168 hours CBC:  Recent Labs Lab 04/03/13 1158  WBC 7.7  NEUTROABS 5.4  HGB 13.7  HCT 40.1  MCV 92.6  PLT 329   Cardiac Enzymes:  Recent Labs Lab 04/03/13 1158  TROPONINI <0.30    BNP (last 3 results) No results found for this basename: PROBNP,  in the last 8760 hours CBG: No results found for this basename: GLUCAP,  in the last 168 hours  Radiological Exams on Admission: Dg Chest 2 View  04/03/2013   CLINICAL DATA:  Cough and shortness of breath  EXAM: CHEST  2 VIEW  COMPARISON:  November 22, 2012  FINDINGS: There is underlying  emphysematous change, stable. There is no edema or consolidation. The heart size is normal. The pulmonary vascularity reflects underlying emphysema. No adenopathy. No pneumothorax. No bone lesions.  IMPRESSION: Underlying emphysematous change.  No edema or consolidation.   Electronically Signed   By: Bretta Bang M.D.   On: 04/03/2013 14:06    EKG: Independently reviewed. NSR  Assessment/Plan Principal Problem:   Chest pain Active Problems:   Sciatica   BACK PAIN, CHRONIC  1. Chest pain 1. Possible anxiety related, as pt's family member is sick in the ICU 2. However, given strong family risk factors and tobacco abuse, cardiogenic pain cannot be ruled out yet 3. Cont on PRN morphine and ntg 4. Cont on tele 5. Will follow serial cardiac enzymes and will obtain a 2D echo to r/o WMA 6. May consider outpatient stress 7. Admit to met/tele - obs 2. Chronic pain 1. Pain mgt as tolerated 3. Hx anxiety 1. Cont PRN benzos as needed 4. Tobacco abuse 1. cessation done face to face  Code Status: Full (must indicate code status--if unknown or must be presumed, indicate so) Family Communication: Pt in room (indicate person spoken with, if applicable, with phone number if by telephone) Disposition Plan: Pending (indicate anticipated LOS)  Time spent:  Margean Korell K [facility] Pager [phone]  If 7PM-7AM, please contact night-coverage [email or web address] Password [email or web address] 04/03/2013, 2:40 PM

## 2013-04-03 NOTE — ED Provider Notes (Signed)
This chart was scribed for Nichole Maw Zennie Ayars, DO, by Yevette Edwards, ED Scribe. This patient was seen in room APA01/APA01 and the patient's care was started at 12:17 PM.   TIME SEEN: 12:17 PM  CHIEF COMPLAINT: Chest pain, palpitations, shortness of, dizziness  HPI:  Nichole [name] is a 42 y.o. female, with a h/o bipolar 1 disorder and panic attacks and who is a current smoker, who presents to the Emergency Department complaining of due to having a HR of 120 when she was at [facility] for evaluation.  She has experienced intermittent centrally-located chest pain without radiation, dizziness, lightheadedness, and SOB. The pt also has a baseline productive cough. She characterizes the chest pain as "heaviness", and she has experienced chest pain intermittently for a week; nothing makes the chest pain better or worse.  She denies diaphoresis and nausea. She also denies HTN, high cholesterol, diabetes, blood clots, recent surgeries, fractures, trauma, exogenous hormones, or long travel/flight. Her father had a MI in his 67's.  She denies any stress tests or cardiac stents.    ROS: See HPI Constitutional: no fever; no diaphoresis Eyes: no drainage  ENT: no runny nose   Cardiovascular:  chest pain  Resp: SOB  GI: no nausea; no vomiting GU: no dysuria Integumentary: no rash  Allergy: no hives  Musculoskeletal: no leg swelling  Neurological: dizziness; lightheadedness; no slurred speech ROS otherwise negative  PAST MEDICAL HISTORY/PAST SURGICAL HISTORY:  Past Medical History  Diagnosis Date  . Bipolar 1 disorder   . Panic attacks     MEDICATIONS:  Prior to Admission medications   Medication Sig Start Date End Date Taking? Authorizing Provider  baclofen (LIORESAL) 10 MG tablet Take 1 tablet (10 mg total) by mouth 3 (three) times daily. 03/08/13 04/07/13  Kathie Dike, PA-C  baclofen (LIORESAL) 10 MG tablet Take 1 tablet (10 mg total) by mouth 3 (three) times daily. 03/17/13   [name], MD   citalopram (CELEXA) 20 MG tablet Take 20 mg by mouth daily.    Historical Provider, MD  diazepam (VALIUM) 5 MG tablet Take 5 mg by mouth daily.     Historical Provider, MD  diazepam (VALIUM) 5 MG tablet Take one pill every 12 hours as needed for nerves 03/17/13   [name], MD  diclofenac (VOLTAREN) 75 MG EC tablet Take 1 tablet (75 mg total) by mouth 2 (two) times daily. 03/08/13   Kathie Dike, PA-C  diclofenac (VOLTAREN) 75 MG EC tablet Take 1 tablet (75 mg total) by mouth 2 (two) times daily. 03/17/13   [name], MD  HYDROcodone-acetaminophen [facility]) 5-325 MG per tablet 1 po q4h prn pain 03/08/13   Kathie Dike, PA-C  HYDROcodone-acetaminophen (NORCO/VICODIN) 5-325 MG per tablet Take 1 tablet by mouth every 6 (six) hours as needed for moderate pain. 03/17/13   [name], MD    ALLERGIES:  Allergies  Allergen Reactions  . Percocet [Oxycodone-Acetaminophen]     headache    SOCIAL HISTORY:  History  Substance Use Topics  . Smoking status: Current Every Day Smoker    Types: Cigarettes  . Smokeless tobacco: Not on file  . Alcohol Use: No    FAMILY HISTORY: No family history on file.  Father with CAD in his 47s.  EXAM: BP 113/72  Temp(Src) 98.7 F (37.1 C) (Oral)  Resp 20  Ht 5\' 8"  (1.727 m)  Wt 175 lb (79.379 kg)  BMI 26.61 kg/m2  SpO2 98%  LMP  03/20/2013 CONSTITUTIONAL: Alert and oriented and responds appropriately to questions. Well-appearing; well-nourished HEAD: Normocephalic EYES: Conjunctivae clear, PERRL ENT: normal nose; no rhinorrhea; moist mucous membranes; pharynx without lesions noted NECK: Supple, no meningismus, no LAD  CARD: RRR; S1 and S2 appreciated; no murmurs, no clicks, no rubs, no gallops RESP: Normal chest excursion without splinting or tachypnea; breath sounds clear and equal bilaterally; no wheezes, no rhonchi, no rales,  ABD/GI: Normal bowel sounds; non-distended; soft, non-tender, no rebound, no guarding BACK:  The back  appears normal and is non-tender to palpation, there is no CVA tenderness EXT: Normal ROM in all joints; non-tender to palpation; no edema; normal capillary refill; no cyanosis    SKIN: Normal color for age and race; warm NEURO: Moves all extremities equally PSYCH: The patient's mood and manner are appropriate. Grooming and personal hygiene are appropriate.  MEDICAL DECISION MAKING: Patient here with chest tightness, shortness of breath and palpitations. Her heart rate is fluctuating to the upper 100s and then resolved spontaneously. She is in sinus rhythm. Her only risk factors for ACS are tobacco use and family history.  Given unexplained tachycardia and chest pain or shortness of breath, will obtain cardiac labs, d-dimer, chest x-ray.  Patient's troponin is negative, EKG unremarkable, chest x-ray clear. Her d-dimer is also negative. Patient reports complete resolution of her chest tightness with nitroglycerin. Given she does have risk factors for ACS and concerning history, will admit for ACS rule out and stress test. She does not have a primary care physician. Patient is comfortable with this plan.  Spoke with Dr. Rhona Leavens for admission.   EKG Interpretation    Date/Time:  Tuesday April 03 2013 11:51:56 EST Ventricular Rate:  80 PR Interval:  136 QRS Duration: 88 QT Interval:  380 QTC Calculation: 438 R Axis:   80 Text Interpretation:  Normal sinus rhythm Normal ECG No previous ECGs available Confirmed by Oneika Simonian  DO, Garo Heidelberg ([id number]) on 04/03/2013 12:18:10 PM              I personally performed the services described in this documentation, which was scribed in my presence. The recorded information has been reviewed and is accurate.       Nichole Maw Nichole Schnider, DO 04/03/13 1443

## 2013-04-03 NOTE — ED Notes (Signed)
Was at [facility] for med refill and reports HR was 120 so was told to come here.  Reports started having cp on the way here.  Reports dizziness/lightheadedness and sob.

## 2013-04-04 DIAGNOSIS — R072 Precordial pain: Secondary | ICD-10-CM

## 2013-04-04 DIAGNOSIS — F411 Generalized anxiety disorder: Secondary | ICD-10-CM

## 2013-04-04 LAB — COMPREHENSIVE METABOLIC PANEL
Albumin: 3.3 g/dL — ABNORMAL LOW (ref 3.5–5.2)
BUN: 10 mg/dL (ref 6–23)
Chloride: 101 mEq/L (ref 96–112)
Creatinine, Ser: 0.81 mg/dL (ref 0.50–1.10)
GFR calc Af Amer: >90 mL/min (ref >90)
GFR calc non Af Amer: 90 mL/min — ABNORMAL LOW (ref >90)
Sodium: 136 mEq/L (ref 135–145)
Total Bilirubin: 0.1 mg/dL — ABNORMAL LOW (ref 0.3–1.2)

## 2013-04-04 LAB — CBC
HCT: 43.2 % (ref 36.0–46.0)
Hemoglobin: 14.5 g/dL (ref 12.0–15.0)
MCH: 31.5 pg (ref 26.0–34.0)
MCV: 93.7 fL (ref 78.0–100.0)
Platelets: 342 10*3/uL (ref 150–400)
RBC: 4.61 MIL/uL (ref 3.87–5.11)
WBC: 8.9 10*3/uL (ref 4.0–10.5)

## 2013-04-04 NOTE — Discharge Summary (Signed)
Physician Discharge Summary  Nichole VILLASENOR [name]:[id number] DOB: 19-Mar-1971 DOA: 04/03/2013  PCP: No PCP Per Patient  Admit date: 04/03/2013 Discharge date: 04/04/2013  Time spent: 35 minutes  Recommendations for Outpatient Follow-up:  1. Follow up with cardiology as an outpatient for consideration of stress test  Discharge Diagnoses:  Principal Problem:   Chest pain Active Problems:   Sciatica   BACK PAIN, CHRONIC   Acute chest pain   Discharge Condition: improved  Diet recommendation: low salt  Filed Weights   04/03/13 1143 04/03/13 1708  Weight: 79.379 kg (175 lb) 78.8 kg (173 lb 11.6 oz)    History of present illness:  Nichole [name] is a 42 y.o. female  Tobacco abuser with a hx of anxiety who presents to the ED from [facility] with tachycardia and intermittent chest pains. In the ED, the patient was found to have cardiac enzymes that were neg x1. An EKG demonstrated normal sinus rhythm. The patient was given one dose of ntg with resolution of CP within minutes. In addition, the patient noted having a strong family history of heart disease. Consequently, the hospitalists were consulted for admission.  Of note, pt's family member has been sick in the ICU for the past week. Pt admits to feeling stressed during this time and not sleeping well   Hospital Course:  This patient was admitted for chest pain. She ruled out for ACS with negative cardiac markers, normal EKG, negative d-dimer. Echocardiogram was also done and found to be unremarkable. It was felt that her pain is likely related to stress, since she does have a family member currently in the ICU. Since her admission, patient has not had any further pain. She does feel improved. She does have some risk factors including tobacco use, age. She may benefit from an outpatient stress test. Cardiology followup has been arranged. The patient is stable for discharge home today.  Procedures: Echo: - Left ventricle: The cavity size was  normal. Wall thickness was normal. Systolic function was normal. The estimated ejection fraction was in the range of 60% to 65%. Wall motion was normal; there were no regional wall motion abnormalities. Left ventricular diastolic function parameters were normal. - Aortic valve: Structurally normal valve. Trileaflet. Transvalvular velocity was within the normal range. There was no stenosis. No regurgitation. Valve area: 1.96cm^2(VTI). Valve area: 2.27cm^2 (Vmax). - Aortic root: The aortic root was normal in size. - Mitral valve: Structurally normal valve. Transvalvular velocity was within the normal range. There was no evidence for stenosis. No regurgitation. - Left atrium: The atrium was normal in size. - Right ventricle: The cavity size was normal. Wall thickness was normal. Systolic function was normal. RV TAPSE is 2.0 cm. - Right atrium: The atrium was normal in size. - Tricuspid valve: Structurally normal valve. There was no evidence for stenosis. No regurgitation. - Pulmonic valve: There was no evidence for stenosis. No significant regurgitation. - Pulmonary arteries: Systolic pressure could not be accurately estimated.    Consultations:  none  Discharge Exam: Filed Vitals:   04/04/13 1513  BP: 112/79  Pulse: 80  Temp: 98 F (36.7 C)  Resp: 20    General: NAD Cardiovascular: S1, S2 RRR Respiratory: CTA B  Discharge Instructions  Discharge Orders   Future Appointments Provider Department Dept Phone   04/27/2013 10:30 AM Pricilla Riffle, MD [facility] [phone]   Future Orders Complete By Expires   Call MD for:  severe uncontrolled pain  As directed  Diet - low sodium heart healthy  As directed    Increase activity slowly  As directed        Medication List         baclofen 10 MG tablet  Commonly known as:  LIORESAL  Take 1 tablet (10 mg total) by mouth 3 (three) times daily.     citalopram 20 MG tablet  Commonly known as:  CELEXA   Take 20 mg by mouth daily.     diazepam 5 MG tablet  Commonly known as:  VALIUM  Take one pill every 12 hours as needed for nerves     ibuprofen 400 MG tablet  Commonly known as:  ADVIL,MOTRIN  Take 400 mg by mouth every 8 (eight) hours as needed.       Allergies  Allergen Reactions  . Percocet [Oxycodone-Acetaminophen]     headache       Follow-up Information   Follow up with follow up with primary care doctor in 2 weeks.      Follow up with Dr. Dietrich Pates, [facility] cardiology clinic On 04/27/2013. (10:30am, cardiologist)        The results of significant diagnostics from this hospitalization (including imaging, microbiology, ancillary and laboratory) are listed below for reference.    Significant Diagnostic Studies: Dg Chest 2 View  04/03/2013   CLINICAL DATA:  Cough and shortness of breath  EXAM: CHEST  2 VIEW  COMPARISON:  November 22, 2012  FINDINGS: There is underlying emphysematous change, stable. There is no edema or consolidation. The heart size is normal. The pulmonary vascularity reflects underlying emphysema. No adenopathy. No pneumothorax. No bone lesions.  IMPRESSION: Underlying emphysematous change.  No edema or consolidation.   Electronically Signed   By: Bretta Bang M.D.   On: 04/03/2013 14:06    Microbiology: No results found for this or any previous visit (from the past 240 hour(s)).   Labs: Basic Metabolic Panel:  Recent Labs Lab 04/03/13 1158 04/04/13 0212  NA 140 136  K 3.3* 3.8  CL 104 101  CO2 24 25  GLUCOSE 108* 95  BUN 11 10  CREATININE 0.76 0.81  CALCIUM 9.4 8.9   Liver Function Tests:  Recent Labs Lab 04/04/13 0212  AST 11  ALT 8  ALKPHOS 81  BILITOT 0.1*  PROT 6.5  ALBUMIN 3.3*   No results found for this basename: LIPASE, AMYLASE,  in the last 168 hours No results found for this basename: AMMONIA,  in the last 168 hours CBC:  Recent Labs Lab 04/03/13 1158 04/04/13 0212  WBC 7.7 8.9  NEUTROABS 5.4   --   HGB 13.7 14.5  HCT 40.1 43.2  MCV 92.6 93.7  PLT 329 342   Cardiac Enzymes:  Recent Labs Lab 04/03/13 1158 04/03/13 2011 04/04/13 0212 04/04/13 0819  TROPONINI <0.30 <0.30 <0.30 <0.30   BNP: BNP (last 3 results) No results found for this basename: PROBNP,  in the last 8760 hours CBG: No results found for this basename: GLUCAP,  in the last 168 hours     Signed:  Coraline Talwar  [facility] 04/04/2013, 4:22 PM

## 2013-04-04 NOTE — Care Management Note (Signed)
    Page 1 of 1   04/04/2013     11:40:35 AM   CARE MANAGEMENT NOTE 04/04/2013  Patient:  Nichole [name], Nichole [name]   Account Number:  [id number]  Date Initiated:  04/04/2013  Documentation initiated by:  Sharrie Rothman  Subjective/Objective Assessment:   Pt admitted from home with CP. Pt lives with her husband and will return home at discharge. Pt is independent with ADL's. Pt receives PCP care at [facility] and [facility].     Action/Plan:   Pt stated that family helps her with medication. Financial counselor is aware and already working with pt on financial applications.   Anticipated DC Date:  04/04/2013   Anticipated DC Plan:  HOME/SELF CARE  In-house referral  Financial Counselor      DC Planning Services  CM consult      Choice offered to / List presented to:             Status of service:  Completed, signed off Medicare Important Message given?   (If response is "NO", the following Medicare IM given date fields will be blank) Date Medicare IM given:   Date Additional Medicare IM given:    Discharge Disposition:  HOME/SELF CARE  Per UR Regulation:    If discussed at Long Length of Stay Meetings, dates discussed:    Comments:  04/04/13 1140 Arlyss Queen, RN BSN CM

## 2013-04-04 NOTE — Progress Notes (Signed)
*  PRELIMINARY RESULTS* Echocardiogram 2D Echocardiogram has been performed.  Nichole [name] 04/04/2013, 9:19 AM

## 2013-04-04 NOTE — Progress Notes (Signed)
UR completed 

## 2013-04-05 NOTE — Progress Notes (Signed)
Patient received discharge instructions along with follow up appointments and prescriptions. Patient verbalized understanding of all instructions. Patient was escorted by staff via wheelchair to vehicle. Patient discharged to home in stable condition. 

## 2013-04-27 ENCOUNTER — Encounter: Payer: Self-pay | Admitting: Internal Medicine

## 2013-04-27 ENCOUNTER — Encounter: Payer: Self-pay | Admitting: *Deleted

## 2013-04-27 ENCOUNTER — Ambulatory Visit (INDEPENDENT_AMBULATORY_CARE_PROVIDER_SITE_OTHER): Payer: Self-pay | Admitting: Internal Medicine

## 2013-04-27 VITALS — BP 109/61 | HR 86 | Ht 68.0 in | Wt 174.0 lb

## 2013-04-27 DIAGNOSIS — R079 Chest pain, unspecified: Secondary | ICD-10-CM

## 2013-04-27 DIAGNOSIS — E782 Mixed hyperlipidemia: Secondary | ICD-10-CM

## 2013-04-27 NOTE — Progress Notes (Signed)
HPI  Patient is a 42 yo who was admitted in early December for chest pain  Like weight on chest. Not pleuritic  Stayed in mid chest  Eased off after NTG and ASA HR at [facility] was elevated in 120s  Was nauseated, dizzy Sent to ER R/O for MI  Echo done  LVEF was normal   Had one spell since  NOt bad  Not associated with activity  Was not stressed   Allergies Percocet PMH  Bipolar disorder SHx  Smokes since age 8  1 ppd.   FHX  CAD in mom, dad and one of brothers    ROS  All systems reviewed  Neg to the above problem except as noted above.     Objective: Filed Vitals:   04/27/13 1021  BP: 109/61  Pulse: 86  Height: 5\' 8"  (1.727 m)  Weight: 174 lb (78.926 kg)  SpO2: 99%   @WEIGHTCHANGE @ @IOBRIEF @  General: Alert, awake, oriented x3, in no acute distress Neck:  JVP is normal  No bruits  No thyromegaly Heart: Regular rate and rhythm, without murmurs, rubs, gallops.  Lungs: Clear to auscultation.  No rales or wheezes. Abdomen  SUpple  Nontender  No massess No hepatomegaly   Exemities:  No edema.  2+ PT pulses  Neuro: Grossly intact, nonfocal.   Lab Results: No results found for this or any previous visit (from the past 24 hour(s)).  Studies/Results: @RISRSLT24 @  Medication:  BUSPAR, CELEXA, VALIUM, ADVIL prn  Impression:  Patient is a a6 yo with a history of tobacco use and a strong FHX of CP  Presented to ER with CP  Atypical  R/O for mi   NOt too active I would recomm following  If recurs she will call  It is atypical.  2.  HCM  Check lipids.    3.  Tob  Counselled on cessation  F/u based on test results.    Dietrich Pates 04/27/2013, 10:39 AM  a

## 2013-04-27 NOTE — Patient Instructions (Addendum)
Your physician recommends that you schedule a follow-up appointment in: AS NEEDED  Your physician recommends that you return for lab work in: NEXT WEEK, YOU WILL NEED TO BE FASTING (SLIPS GIVEN FOR LIPID PANEL)  Your physician has requested that you have en exercise stress myoview. For further information please visit [email or web address] Please follow instruction sheet, as given.

## 2013-05-23 ENCOUNTER — Telehealth: Payer: Self-pay | Admitting: *Deleted

## 2013-05-23 NOTE — Telephone Encounter (Signed)
FYI: please note the pt request below for your records

## 2013-05-23 NOTE — Telephone Encounter (Signed)
Message copied by Ovidio KinPULLIAM, Kurt Hoffmeier A on Wed May 23, 2013  3:44 PM ------      Message from: San JettyJACKSON, TANYA M      Created: Wed May 23, 2013  9:25 AM      Regarding: RE: pt needs apt       Cordelia PenSherry would like to wait on having her stress test done. She doesn't have the money.                  ----- Message -----         From: Ovidio KinKimberly A Kemoni Ortega, LPN         Sent: 05/22/2013   5:23 PM           To: Faythe Gheeanya Jackson      Subject: pt needs apt                                             Noted pt had OV with Dr Tenny Crawoss on 04-27-13, pt was to be scheduled for stress myoview however unable to locate an apt        ------

## 2013-05-29 NOTE — Telephone Encounter (Signed)
.  left message to have patient return my call.  

## 2013-05-29 NOTE — Telephone Encounter (Signed)
OK  Have her call back in 1 month to tell us if she is having any further spells of CP

## 2013-05-30 NOTE — Telephone Encounter (Signed)
Spoke to pt to advise results/instructions. Pt understood. Pt advised she has not experienced CP recently and will call our office in a month to advise if she has any further episodes of chest pain

## 2013-07-02 ENCOUNTER — Encounter (HOSPITAL_COMMUNITY): Payer: Self-pay | Admitting: Emergency Medicine

## 2013-07-02 ENCOUNTER — Emergency Department (HOSPITAL_COMMUNITY): Payer: Self-pay

## 2013-07-02 ENCOUNTER — Ambulatory Visit: Payer: Self-pay

## 2013-07-02 ENCOUNTER — Emergency Department (HOSPITAL_COMMUNITY)
Admission: EM | Admit: 2013-07-02 | Discharge: 2013-07-02 | Discharge status: Against Medical Advice | Payer: Self-pay | Attending: Emergency Medicine | Admitting: Emergency Medicine

## 2013-07-02 VITALS — BP 122/89 | HR 127 | Wt 183.0 lb

## 2013-07-02 DIAGNOSIS — R0602 Shortness of breath: Secondary | ICD-10-CM | POA: Insufficient documentation

## 2013-07-02 DIAGNOSIS — N951 Menopausal and female climacteric states: Secondary | ICD-10-CM | POA: Insufficient documentation

## 2013-07-02 DIAGNOSIS — R000 Tachycardia, unspecified: Secondary | ICD-10-CM | POA: Insufficient documentation

## 2013-07-02 DIAGNOSIS — F319 Bipolar disorder, unspecified: Secondary | ICD-10-CM | POA: Insufficient documentation

## 2013-07-02 DIAGNOSIS — R0789 Other chest pain: Secondary | ICD-10-CM

## 2013-07-02 DIAGNOSIS — Z79899 Other long term (current) drug therapy: Secondary | ICD-10-CM | POA: Insufficient documentation

## 2013-07-02 DIAGNOSIS — F17200 Nicotine dependence, unspecified, uncomplicated: Secondary | ICD-10-CM | POA: Insufficient documentation

## 2013-07-02 DIAGNOSIS — J45909 Unspecified asthma, uncomplicated: Secondary | ICD-10-CM | POA: Insufficient documentation

## 2013-07-02 DIAGNOSIS — R079 Chest pain, unspecified: Secondary | ICD-10-CM | POA: Insufficient documentation

## 2013-07-02 DIAGNOSIS — F410 Panic disorder [episodic paroxysmal anxiety] without agoraphobia: Secondary | ICD-10-CM | POA: Insufficient documentation

## 2013-07-02 LAB — CBC WITH DIFFERENTIAL/PLATELET
BASOS PCT: 1 % (ref 0–1)
Basophils Absolute: 0.1 10*3/uL (ref 0.0–0.1)
Eosinophils Absolute: 0.3 10*3/uL (ref 0.0–0.7)
Eosinophils Relative: 3 % (ref 0–5)
HCT: 43.2 % (ref 36.0–46.0)
HEMOGLOBIN: 14.6 g/dL (ref 12.0–15.0)
Lymphocytes Relative: 25 % (ref 12–46)
Lymphs Abs: 2.2 10*3/uL (ref 0.7–4.0)
MCH: 31.6 pg (ref 26.0–34.0)
MCHC: 33.8 g/dL (ref 30.0–36.0)
MCV: 93.5 fL (ref 78.0–100.0)
Monocytes Absolute: 0.4 10*3/uL (ref 0.1–1.0)
Monocytes Relative: 4 % (ref 3–12)
NEUTROS ABS: 6 10*3/uL (ref 1.7–7.7)
NEUTROS PCT: 67 % (ref 43–77)
Platelets: 388 10*3/uL (ref 150–400)
RBC: 4.62 MIL/uL (ref 3.87–5.11)
RDW: 14.7 % (ref 11.5–15.5)
WBC: 8.9 10*3/uL (ref 4.0–10.5)

## 2013-07-02 LAB — BASIC METABOLIC PANEL
BUN: 12 mg/dL (ref 6–23)
CHLORIDE: 106 meq/L (ref 96–112)
CO2: 22 mEq/L (ref 19–32)
Calcium: 9.5 mg/dL (ref 8.4–10.5)
Creatinine, Ser: 0.64 mg/dL (ref 0.50–1.10)
GFR calc non Af Amer: >90 mL/min (ref >90)
Glucose, Bld: 93 mg/dL (ref 70–99)
POTASSIUM: 3.9 meq/L (ref 3.7–5.3)
Sodium: 140 mEq/L (ref 137–147)

## 2013-07-02 LAB — TROPONIN I

## 2013-07-02 LAB — D-DIMER, QUANTITATIVE (NOT AT ARMC): D DIMER QUANT: 0.36 ug{FEU}/mL (ref 0.00–0.48)

## 2013-07-02 MED ORDER — ASPIRIN 81 MG PO CHEW
324.0000 mg | CHEWABLE_TABLET | Freq: Once | ORAL | Status: AC
  Administered 2013-07-02: 324 mg via ORAL
  Filled 2013-07-02: qty 4

## 2013-07-02 NOTE — ED Provider Notes (Addendum)
CSN: [id number]     Arrival date & time 07/02/13  0945 History   This chart was scribed for Doug SouSam Brittnye Josephs, MD by Ladona Ridgelaylor Day, ED scribe. This patient was seen in room APA18/APA18 and the patient's care was started at 0945.  Chief Complaint  Patient presents with  . Chest Pain   Patient is a 43 y.o. female presenting with chest pain. The history is provided by the patient. No language interpreter was used.  Chest Pain Pain quality: pressure   Pain radiates to:  Does not radiate Pain severity:  Mild Duration:  2 hours Timing:  Constant Progression:  Unchanged Chronicity:  New Relieved by:  Nothing Worsened by:  Nothing tried Ineffective treatments:  None tried Associated symptoms: no abdominal pain, no fever, no nausea and no shortness of breath    HPI Comments: Nichole [name] is a 43 y.o. female who presents to the Emergency Department complaining of pressure like CP, onset 1 hour ago and elevated HR this AM. She is presently asymptomatic She reports similar episode x3 months ago in which she was admitted for same and is scheduled for a stress test this week. She reports not currently having CP. She reports tachycardia and CP [facility] and sent here immediately. She was not evaluated by a physician. The nurse in the office escorted her over here. She reports multiple similar previous episodes of tachycardia.  She has several episodes of tachycardia a week. She reports mild SOB at times w/tachycardia. No medicines PTA, no NTG today. She takes buspar, valium and celexa. LNMP x2 weeks ago.  Family hx of MI, mother had MI in 2640's as well father in his 2740's. Her brother had MI at age of 42 She smokes about 1 ppd. Does not drink alcohol and takes no illegal drugs. Cardiac risk factors family history smoker Allergic to percocet.   Past Medical History  Diagnosis Date  . Bipolar 1 disorder   . Panic attacks    Past Surgical History  Procedure Laterality Date  .  Tonsillectomy    . Cesarean section    . Cyst removed from wrist    . Tubal ligation     No family history on file. History  Substance Use Topics  . Smoking status: Current Every Day Smoker    Types: Cigarettes  . Smokeless tobacco: Not on file  . Alcohol Use: No   OB History   Grav Para Term Preterm Abortions TAB SAB Ect Mult Living                 Review of Systems  Constitutional: Negative.  Negative for fever.  HENT: Negative.   Respiratory: Negative.  Negative for shortness of breath.   Cardiovascular: Positive for chest pain.  Gastrointestinal: Negative.  Negative for nausea and abdominal pain.  Genitourinary:       Perimenopausal  Musculoskeletal: Negative.   Skin: Negative.   Neurological: Negative.   Psychiatric/Behavioral: Negative.   All other systems reviewed and are negative.    Allergies  Percocet  Home Medications   Current Outpatient Rx  Name  Route  Sig  Dispense  Refill  . busPIRone (BUSPAR) 5 MG tablet   Oral   Take 5 mg by mouth.         . citalopram (CELEXA) 20 MG tablet   Oral   Take 20 mg by mouth daily.         . diazepam (VALIUM) 5 MG  tablet      Take one pill every 12 hours as needed for nerves   30 tablet   0   . ibuprofen (ADVIL,MOTRIN) 400 MG tablet   Oral   Take 400 mg by mouth every 8 (eight) hours as needed.          Triage Vitals: BP 127/88  Pulse 115  Temp(Src) 98.5 F (36.9 C) (Oral)  Resp 20  Ht 5\' 8"  (1.727 m)  Wt 183 lb (83.008 kg)  BMI 27.83 kg/m2  SpO2 96%  LMP 06/18/2013  Physical Exam  Nursing note and vitals reviewed. Constitutional: She appears well-developed and well-nourished.  HENT:  Head: Normocephalic and atraumatic.  Eyes: Conjunctivae are normal. Pupils are equal, round, and reactive to light.  Neck: Neck supple. No tracheal deviation present. No thyromegaly present.  Cardiovascular: Regular rhythm.   No murmur heard. Mildly tachycardic  Pulmonary/Chest: Effort normal and breath  sounds normal.  Abdominal: Soft. Bowel sounds are normal. She exhibits no distension. There is no tenderness.  Musculoskeletal: Normal range of motion. She exhibits no edema and no tenderness.  Neurological: She is alert. Coordination normal.  Skin: Skin is warm and dry. No rash noted.  Psychiatric: She has a normal mood and affect.    ED Course  Procedures (including critical care time) DIAGNOSTIC STUDIES: Oxygen Saturation is 96% on room air, adequate by my interpretation.    COORDINATION OF CARE: At 1020 AM Discussed treatment plan with patient which includes EKG, blood work, cardiac enzymes, CXR, D-dimer. Patient agrees.   Labs Review Labs Reviewed  CBC WITH DIFFERENTIAL  BASIC METABOLIC PANEL  TROPONIN I   Imaging Review No results found.   EKG Interpretation   Date/Time:  Monday July 02 2013 10:03:39 EST Ventricular Rate:  112 PR Interval:  136 QRS Duration: 80 QT Interval:  336 QTC Calculation: 458 R Axis:   80 Text Interpretation:  Sinus tachycardia Possible Left atrial enlargement  Borderline ECG When compared with ECG of 03-Apr-2013 11:51, No significant  change was found SINCE LAST TRACING HEART RATE HAS INCREASED Confirmed by  Ethelda Chick  MD, Wynter Grave [phone]) on 07/02/2013 10:44:13 AM       chest xray viewed by me Results for orders placed during the hospital encounter of 07/02/13  CBC WITH DIFFERENTIAL      Result Value Ref Range   WBC 8.9  4.0 - 10.5 K/uL   RBC 4.62  3.87 - 5.11 MIL/uL   Hemoglobin 14.6  12.0 - 15.0 g/dL   HCT 81.1  91.4 - 78.2 %   MCV 93.5  78.0 - 100.0 fL   MCH 31.6  26.0 - 34.0 pg   MCHC 33.8  30.0 - 36.0 g/dL   RDW 95.6  21.3 - 08.6 %   Platelets 388  150 - 400 K/uL   Neutrophils Relative % 67  43 - 77 %   Neutro Abs 6.0  1.7 - 7.7 K/uL   Lymphocytes Relative 25  12 - 46 %   Lymphs Abs 2.2  0.7 - 4.0 K/uL   Monocytes Relative 4  3 - 12 %   Monocytes Absolute 0.4  0.1 - 1.0 K/uL   Eosinophils Relative 3  0 - 5 %   Eosinophils  Absolute 0.3  0.0 - 0.7 K/uL   Basophils Relative 1  0 - 1 %   Basophils Absolute 0.1  0.0 - 0.1 K/uL  BASIC METABOLIC PANEL      Result Value Ref Range  Sodium 140  137 - 147 mEq/L   Potassium 3.9  3.7 - 5.3 mEq/L   Chloride 106  96 - 112 mEq/L   CO2 22  19 - 32 mEq/L   Glucose, Bld 93  70 - 99 mg/dL   BUN 12  6 - 23 mg/dL   Creatinine, Ser 4.09  0.50 - 1.10 mg/dL   Calcium 9.5  8.4 - 81.1 mg/dL   GFR calc non Af Amer >90  >90 mL/min   GFR calc Af Amer >90  >90 mL/min  TROPONIN I      Result Value Ref Range   Troponin I <0.30  <0.30 ng/mL  D-DIMER, QUANTITATIVE      Result Value Ref Range   D-Dimer, Quant 0.36  0.00 - 0.48 ug/mL-FEU   Dg Chest Portable 1 View  07/02/2013   CLINICAL DATA:  Chest pain and tachycardia  EXAM: PORTABLE CHEST - 1 VIEW  COMPARISON:  April 03, 2013  FINDINGS: There is a degree of underlying emphysematous type change. There is no edema or consolidation. Heart size and pulmonary vascularity are within normal limits given underlying emphysematous change. No adenopathy. No pneumothorax. No bone lesions.  IMPRESSION: No edema or consolidation.   Electronically Signed   By: Bretta Bang M.D.   On: 07/02/2013 10:32    Spoke with the hospital pharmacist Celexa has incidence greater than 1% of tachycardia  asside effect MDM   Final diagnoses:  None   I spoke with Dr. Wyline Mood, cardiologist who suggests inpatient stay for a stress test, in light of today's episode of chest pain. Patient is leaving AGAINST MEDICAL ADVICE stating that she has a daycare family members and will return later today. I've advised her of the risk of sudden death and heart attack. Given aspirin prior to discharge   I personally performed the services described in this documentation, which was scribed in my presence. The recorded information has been reviewed and is accurate.  Clinical suspicion of pulmonary embolism is low. Negative d-dimer Diagnosis #1chest pain #2 tobacco  abuse  I personally performed the services described in this documentation, which was scribed in my presence. The recorded information has been reviewed and considered. Doug Sou, MD 07/02/13 1131  Doug Sou, MD 07/02/13 1131

## 2013-07-02 NOTE — Discharge Instructions (Signed)
There is a risk that you could be having a heart attack. There is a risk of sudden death before you can return to the hospital Return immediately or go to another hospital to be admitted to have your heart further evaluated. You're leaving the hospital AGAINST MEDICAL ADVICE

## 2013-07-02 NOTE — Progress Notes (Signed)
Patient walked in ambulatory cardiology dept with c/o Chest pressure and SOB onset 1 hr ago.   VS 122/89, HR 127   After speaking with Dr.Branch, it was determined that patient go direct to ED,I escorted her directly to registration    Spoke with Charge nurse Verlon AuLeslie in ED

## 2013-07-02 NOTE — ED Notes (Signed)
Pt reports chest pressure x 1 hour and reports heart rate has been elevated all morning.  Reports has stress test scheduled for Wed.

## 2013-07-03 NOTE — Care Management (Signed)
Delayed entry 07/02/13 ED/CM noted patient did not have health insurance and/or PCP listed in the computer.  Patient was given the [place] resource handout with information on the clinics, food pantries, and the handout for new health insurance sign-up.  Patient expressed appreciation for information received.  

## 2013-07-04 ENCOUNTER — Encounter (HOSPITAL_COMMUNITY)
Admit: 2013-07-04 | Discharge: 2013-07-04 | Disposition: A | Discharge status: Home / Self Care | Payer: Self-pay | Attending: Internal Medicine | Admitting: Internal Medicine

## 2013-07-04 ENCOUNTER — Encounter (HOSPITAL_COMMUNITY): Payer: Self-pay

## 2013-07-04 ENCOUNTER — Encounter (HOSPITAL_COMMUNITY)
Admission: RE | Admit: 2013-07-04 | Discharge: 2013-07-04 | Disposition: A | Discharge status: Home / Self Care | Payer: Self-pay | Source: Ambulatory Visit | Attending: Internal Medicine | Admitting: Internal Medicine

## 2013-07-04 DIAGNOSIS — R079 Chest pain, unspecified: Secondary | ICD-10-CM

## 2013-07-04 DIAGNOSIS — Z8659 Personal history of other mental and behavioral disorders: Secondary | ICD-10-CM | POA: Insufficient documentation

## 2013-07-04 DIAGNOSIS — F17200 Nicotine dependence, unspecified, uncomplicated: Secondary | ICD-10-CM | POA: Insufficient documentation

## 2013-07-04 HISTORY — DX: Unspecified asthma, uncomplicated: J45.909

## 2013-07-04 MED ORDER — TECHNETIUM TC 99M SESTAMIBI GENERIC - CARDIOLITE
30.0000 | Freq: Once | INTRAVENOUS | Status: AC | PRN
  Administered 2013-07-04: 30 via INTRAVENOUS

## 2013-07-04 MED ORDER — SODIUM CHLORIDE 0.9 % IJ SOLN
INTRAMUSCULAR | Status: AC
  Administered 2013-07-04: 10 mL via INTRAVENOUS
  Filled 2013-07-04: qty 10

## 2013-07-04 MED ORDER — TECHNETIUM TC 99M SESTAMIBI - CARDIOLITE
10.0000 | Freq: Once | INTRAVENOUS | Status: AC | PRN
  Administered 2013-07-04: 07:00:00 10 via INTRAVENOUS

## 2013-07-04 NOTE — Progress Notes (Signed)
Stress Lab Nurses Notes - Nichole [name]  Nichole [name] 07/04/2013 Reason for doing test: Chest Pain Type of test: Stress Cardiolite Nurse performing test: Nichole PoissonPhyllis Billingsly, RN Nuclear Medicine Tech: Nichole [name] Echo Tech: Not Applicable MD performing test: S. McDowell/K.Lawrence NP Family MD: Nichole [name] Test explained and consent signed: yes IV started: 22g jelco, Saline lock flushed, No redness or edema and Saline lock started in radiology Symptoms: SOB & dizziness Treatment/Intervention: None Reason test stopped: SOB After recovery IV was: Discontinued via X-ray tech and No redness or edema Patient to return to Nuc. Med at : 9:15 Patient discharged: Home Patient's Condition upon discharge was: stable Comments: During test peak BP 160/79 & HR 160.  Recovery BP 120/78 & HR 108.  Symptoms resolved in recovery. Nichole [name], Nichole [name]

## 2013-11-24 ENCOUNTER — Emergency Department (HOSPITAL_COMMUNITY)
Admission: EM | Admit: 2013-11-24 | Discharge: 2013-11-24 | Disposition: A | Discharge status: Home / Self Care | Payer: Self-pay | Attending: Emergency Medicine | Admitting: Emergency Medicine

## 2013-11-24 ENCOUNTER — Encounter (HOSPITAL_COMMUNITY): Payer: Self-pay | Admitting: Emergency Medicine

## 2013-11-24 DIAGNOSIS — R000 Tachycardia, unspecified: Secondary | ICD-10-CM | POA: Insufficient documentation

## 2013-11-24 DIAGNOSIS — J45909 Unspecified asthma, uncomplicated: Secondary | ICD-10-CM | POA: Insufficient documentation

## 2013-11-24 DIAGNOSIS — Y9289 Other specified places as the place of occurrence of the external cause: Secondary | ICD-10-CM | POA: Insufficient documentation

## 2013-11-24 DIAGNOSIS — T63441A Toxic effect of venom of bees, accidental (unintentional), initial encounter: Secondary | ICD-10-CM

## 2013-11-24 DIAGNOSIS — Z79899 Other long term (current) drug therapy: Secondary | ICD-10-CM | POA: Insufficient documentation

## 2013-11-24 DIAGNOSIS — Y9389 Activity, other specified: Secondary | ICD-10-CM | POA: Insufficient documentation

## 2013-11-24 DIAGNOSIS — F17200 Nicotine dependence, unspecified, uncomplicated: Secondary | ICD-10-CM | POA: Insufficient documentation

## 2013-11-24 DIAGNOSIS — T63461A Toxic effect of venom of wasps, accidental (unintentional), initial encounter: Secondary | ICD-10-CM | POA: Insufficient documentation

## 2013-11-24 DIAGNOSIS — T6391XA Toxic effect of contact with unspecified venomous animal, accidental (unintentional), initial encounter: Secondary | ICD-10-CM | POA: Insufficient documentation

## 2013-11-24 DIAGNOSIS — F410 Panic disorder [episodic paroxysmal anxiety] without agoraphobia: Secondary | ICD-10-CM | POA: Insufficient documentation

## 2013-11-24 DIAGNOSIS — F319 Bipolar disorder, unspecified: Secondary | ICD-10-CM | POA: Insufficient documentation

## 2013-11-24 MED ORDER — EPINEPHRINE 0.3 MG/0.3ML IJ SOAJ: 0.3000 mg | Freq: Once | INTRAMUSCULAR | Status: DC

## 2013-11-24 MED ORDER — LORATADINE 10 MG PO TABS
10.0000 mg | ORAL_TABLET | Freq: Once | ORAL | Status: AC
  Administered 2013-11-24: 10 mg via ORAL
  Filled 2013-11-24: qty 1

## 2013-11-24 MED ORDER — PREDNISONE 10 MG PO TABS: 20.0000 mg | ORAL_TABLET | Freq: Two times a day (BID) | ORAL | Status: DC

## 2013-11-24 MED ORDER — HYDROCODONE-ACETAMINOPHEN 5-325 MG PO TABS
1.0000 | ORAL_TABLET | Freq: Once | ORAL | Status: AC
  Administered 2013-11-24: 1 via ORAL
  Filled 2013-11-24: qty 1

## 2013-11-24 MED ORDER — HYDROCODONE-ACETAMINOPHEN 5-325 MG PO TABS: 1.0000 | ORAL_TABLET | ORAL | Status: DC | PRN

## 2013-11-24 MED ORDER — PREDNISONE 20 MG PO TABS
40.0000 mg | ORAL_TABLET | Freq: Once | ORAL | Status: AC
  Administered 2013-11-24: 40 mg via ORAL
  Filled 2013-11-24: qty 2

## 2013-11-24 MED ORDER — DIPHENHYDRAMINE HCL 25 MG PO TABS: 25.0000 mg | ORAL_TABLET | Freq: Four times a day (QID) | ORAL | Status: DC

## 2013-11-24 MED ORDER — FAMOTIDINE 20 MG PO TABS: 20.0000 mg | ORAL_TABLET | Freq: Two times a day (BID) | ORAL | Status: DC

## 2013-11-24 NOTE — ED Notes (Signed)
Patient with no complaints at this time. Respirations even and unlabored. Skin warm/dry. Discharge instructions reviewed with patient at this time. Patient given opportunity to voice concerns/ask questions. Patient discharged at this time and left Emergency Department with steady gait.   

## 2013-11-24 NOTE — ED Notes (Signed)
States she was stung by a bee around 3pm today on her left hand, states she took benadryl with some relief, states he arm continues to swell

## 2013-11-24 NOTE — ED Provider Notes (Signed)
CSN: [id number]     Arrival date & time 11/24/13  1910 History   First MD Initiated Contact with Patient 11/24/13 1930     Chief Complaint  Patient presents with  . Allergic Reaction     (Consider location/radiation/quality/duration/timing/severity/associated sxs/prior Treatment) Patient is a 43 y.o. female presenting with allergic reaction. The history is provided by the patient.  Allergic Reaction Presenting symptoms: itching and swelling   Severity:  Mild Context: insect bite/sting   Relieved by:  Nothing Worsened by:  Nothing tried Ineffective treatments:  Antihistamines  Nichole [name] is a 44 y.o. female who presents to the ED with swelling of her left hand s/p bee sting earlier this afternoon. She took benadryl which helped a little but the area has continued to swell. She has not taken anything for pain. The sting was located on the left hand and she thinks between the ring and little fingers. She has had similar reactions in the past.   Past Medical History  Diagnosis Date  . Bipolar 1 disorder   . Panic attacks   . Asthma    Past Surgical History  Procedure Laterality Date  . Tonsillectomy    . Cesarean section    . Cyst removed from wrist    . Tubal ligation     No family history on file. History  Substance Use Topics  . Smoking status: Current Every Day Smoker    Types: Cigarettes  . Smokeless tobacco: Not on file  . Alcohol Use: No   OB History   Grav Para Term Preterm Abortions TAB SAB Ect Mult Living                 Review of Systems  Musculoskeletal:       Left hand swollen and painful   Skin: Positive for itching.       Bee sting   all other systems negative    Allergies  Percocet  Home Medications   Prior to Admission medications   Medication Sig Start Date End Date Taking? Authorizing Provider  Aspirin-Salicylamide-Caffeine (BC HEADACHE POWDER PO) Take 1 packet by mouth 4 (four) times daily as needed (headache).    Historical  Provider, MD  busPIRone (BUSPAR) 10 MG tablet Take 10 mg by mouth 3 (three) times daily.    Historical Provider, MD  citalopram (CELEXA) 20 MG tablet Take 20 mg by mouth daily.    Historical Provider, MD  diazepam (VALIUM) 5 MG tablet Take one pill every 12 hours as needed for nerves 03/17/13   [name], MD  ibuprofen (ADVIL,MOTRIN) 400 MG tablet Take 400 mg by mouth every 8 (eight) hours as needed for headache.     Historical Provider, MD   BP 110/70  Pulse 109  Temp(Src) 98.3 F (36.8 C)  Resp 20  SpO2 99%  LMP 11/10/2013 Physical Exam  Nursing note and vitals reviewed. Constitutional: She is oriented to person, place, and time. She appears well-developed and well-nourished.  HENT:  Head: Normocephalic.  Mouth/Throat: Uvula is midline, oropharynx is clear and moist and mucous membranes are normal.  Eyes: EOM are normal.  Neck: Neck supple.  Cardiovascular: Tachycardia present.   Pulmonary/Chest: Effort normal. No respiratory distress. She has no wheezes.  Abdominal: Soft. There is no tenderness.  Musculoskeletal: Normal range of motion.       Left hand: She exhibits tenderness and swelling. Normal strength noted.       Hands: Neurological: She is alert and oriented to person,  place, and time. No cranial nerve deficit.  Skin: Skin is warm and dry.  Psychiatric: She has a normal mood and affect. Her behavior is normal.    ED Course  Procedures   MDM  43 y.o. female with reaction to bee sting. Swelling and tenderness to the left hand. Stable for discharge without respiratory symptoms. Discussed with the patient and all questioned fully answered. She will return if any problems arise.    Medication List    TAKE these medications       diphenhydrAMINE 25 MG tablet  Commonly known as:  BENADRYL  Take 1 tablet (25 mg total) by mouth every 6 (six) hours.     EPINEPHrine 0.3 mg/0.3 mL Soaj injection  Commonly known as:  EPIPEN  Inject 0.3 mLs (0.3 mg total) into the  muscle once.     famotidine 20 MG tablet  Commonly known as:  PEPCID  Take 1 tablet (20 mg total) by mouth 2 (two) times daily.     HYDROcodone-acetaminophen 5-325 MG per tablet  Commonly known as:  NORCO/VICODIN  Take 1 tablet by mouth every 4 (four) hours as needed.     predniSONE 10 MG tablet  Commonly known as:  DELTASONE  Take 2 tablets (20 mg total) by mouth 2 (two) times daily with a meal.      ASK your doctor about these medications       BC HEADACHE POWDER PO  Take 1 packet by mouth 4 (four) times daily as needed (headache).     busPIRone 10 MG tablet  Commonly known as:  BUSPAR  Take 10 mg by mouth 3 (three) times daily.     citalopram 20 MG tablet  Commonly known as:  CELEXA  Take 20 mg by mouth daily.     diazepam 5 MG tablet  Commonly known as:  VALIUM  Take one pill every 12 hours as needed for nerves     ibuprofen 400 MG tablet  Commonly known as:  ADVIL,MOTRIN  Take 400 mg by mouth every 8 (eight) hours as needed for headache.           [address]Hope WisconM Neese, [place]NP 11/25/13 [phone]

## 2013-11-27 NOTE — ED Provider Notes (Signed)
Medical screening examination/treatment/procedure(s) were performed by non-physician practitioner and as supervising physician I was immediately available for consultation/collaboration.   EKG Interpretation None       Ardis Lawley, MD 11/27/13 1346 

## 2014-01-07 ENCOUNTER — Encounter (HOSPITAL_COMMUNITY): Payer: Self-pay | Admitting: Emergency Medicine

## 2014-01-07 ENCOUNTER — Emergency Department (HOSPITAL_COMMUNITY)
Admission: EM | Admit: 2014-01-07 | Discharge: 2014-01-07 | Disposition: A | Discharge status: Home / Self Care | Payer: Self-pay | Attending: Emergency Medicine | Admitting: Emergency Medicine

## 2014-01-07 ENCOUNTER — Emergency Department (HOSPITAL_COMMUNITY): Payer: Self-pay

## 2014-01-07 DIAGNOSIS — M549 Dorsalgia, unspecified: Secondary | ICD-10-CM | POA: Insufficient documentation

## 2014-01-07 DIAGNOSIS — J441 Chronic obstructive pulmonary disease with (acute) exacerbation: Secondary | ICD-10-CM | POA: Insufficient documentation

## 2014-01-07 DIAGNOSIS — Z3202 Encounter for pregnancy test, result negative: Secondary | ICD-10-CM | POA: Insufficient documentation

## 2014-01-07 DIAGNOSIS — R1013 Epigastric pain: Secondary | ICD-10-CM | POA: Insufficient documentation

## 2014-01-07 DIAGNOSIS — Z9851 Tubal ligation status: Secondary | ICD-10-CM | POA: Insufficient documentation

## 2014-01-07 DIAGNOSIS — Z791 Long term (current) use of non-steroidal anti-inflammatories (NSAID): Secondary | ICD-10-CM | POA: Insufficient documentation

## 2014-01-07 DIAGNOSIS — F410 Panic disorder [episodic paroxysmal anxiety] without agoraphobia: Secondary | ICD-10-CM | POA: Insufficient documentation

## 2014-01-07 DIAGNOSIS — F17200 Nicotine dependence, unspecified, uncomplicated: Secondary | ICD-10-CM | POA: Insufficient documentation

## 2014-01-07 DIAGNOSIS — R000 Tachycardia, unspecified: Secondary | ICD-10-CM | POA: Insufficient documentation

## 2014-01-07 DIAGNOSIS — Z9889 Other specified postprocedural states: Secondary | ICD-10-CM | POA: Insufficient documentation

## 2014-01-07 DIAGNOSIS — J45901 Unspecified asthma with (acute) exacerbation: Secondary | ICD-10-CM

## 2014-01-07 HISTORY — DX: Chronic obstructive pulmonary disease, unspecified: J44.9

## 2014-01-07 HISTORY — DX: Bipolar disorder, unspecified: F31.9

## 2014-01-07 LAB — CBC WITH DIFFERENTIAL/PLATELET
BASOS ABS: 0.1 10*3/uL (ref 0.0–0.1)
BASOS PCT: 1 % (ref 0–1)
Eosinophils Absolute: 0.3 10*3/uL (ref 0.0–0.7)
Eosinophils Relative: 2 % (ref 0–5)
HCT: 43.2 % (ref 36.0–46.0)
Hemoglobin: 14.9 g/dL (ref 12.0–15.0)
Lymphocytes Relative: 20 % (ref 12–46)
Lymphs Abs: 2.3 10*3/uL (ref 0.7–4.0)
MCH: 32.5 pg (ref 26.0–34.0)
MCHC: 34.5 g/dL (ref 30.0–36.0)
MCV: 94.3 fL (ref 78.0–100.0)
Monocytes Absolute: 0.5 10*3/uL (ref 0.1–1.0)
Monocytes Relative: 4 % (ref 3–12)
NEUTROS PCT: 73 % (ref 43–77)
Neutro Abs: 8.4 10*3/uL — ABNORMAL HIGH (ref 1.7–7.7)
Platelets: 371 10*3/uL (ref 150–400)
RBC: 4.58 MIL/uL (ref 3.87–5.11)
RDW: 13.9 % (ref 11.5–15.5)
WBC: 11.5 10*3/uL — ABNORMAL HIGH (ref 4.0–10.5)

## 2014-01-07 LAB — COMPREHENSIVE METABOLIC PANEL
ALBUMIN: 3.8 g/dL (ref 3.5–5.2)
ALK PHOS: 79 U/L (ref 39–117)
ALT: 10 U/L (ref 0–35)
AST: 13 U/L (ref 0–37)
Anion gap: 13 (ref 5–15)
BUN: 9 mg/dL (ref 6–23)
CO2: 22 meq/L (ref 19–32)
Calcium: 9.1 mg/dL (ref 8.4–10.5)
Chloride: 103 mEq/L (ref 96–112)
Creatinine, Ser: 0.6 mg/dL (ref 0.50–1.10)
GFR calc Af Amer: >90 mL/min (ref >90)
GFR calc non Af Amer: >90 mL/min (ref >90)
Glucose, Bld: 106 mg/dL — ABNORMAL HIGH (ref 70–99)
POTASSIUM: 3.9 meq/L (ref 3.7–5.3)
SODIUM: 138 meq/L (ref 137–147)
Total Protein: 7 g/dL (ref 6.0–8.3)

## 2014-01-07 LAB — URINALYSIS, ROUTINE W REFLEX MICROSCOPIC
Bilirubin Urine: NEGATIVE
Glucose, UA: NEGATIVE mg/dL
Hgb urine dipstick: NEGATIVE
KETONES UR: NEGATIVE mg/dL
LEUKOCYTES UA: NEGATIVE
Nitrite: NEGATIVE
PH: 6.5 (ref 5.0–8.0)
PROTEIN: NEGATIVE mg/dL
Specific Gravity, Urine: 1.01 (ref 1.005–1.030)
Urobilinogen, UA: 0.2 mg/dL (ref 0.0–1.0)

## 2014-01-07 LAB — LIPASE, BLOOD: LIPASE: 22 U/L (ref 11–59)

## 2014-01-07 LAB — PREGNANCY, URINE: Preg Test, Ur: NEGATIVE

## 2014-01-07 MED ORDER — ONDANSETRON HCL 4 MG/2ML IJ SOLN
4.0000 mg | Freq: Once | INTRAMUSCULAR | Status: AC
  Administered 2014-01-07: 4 mg via INTRAVENOUS
  Filled 2014-01-07: qty 2

## 2014-01-07 MED ORDER — TRAMADOL HCL 50 MG PO TABS: 50.0000 mg | ORAL_TABLET | Freq: Four times a day (QID) | ORAL | Status: DC | PRN

## 2014-01-07 MED ORDER — IOHEXOL 300 MG/ML  SOLN
25.0000 mL | Freq: Once | INTRAMUSCULAR | Status: AC | PRN
  Administered 2014-01-07: 25 mL via ORAL

## 2014-01-07 MED ORDER — HYDROMORPHONE HCL PF 1 MG/ML IJ SOLN
1.0000 mg | Freq: Once | INTRAMUSCULAR | Status: AC
  Administered 2014-01-07: 1 mg via INTRAVENOUS
  Filled 2014-01-07: qty 1

## 2014-01-07 MED ORDER — RANITIDINE HCL 150 MG PO CAPS: 150.0000 mg | ORAL_CAPSULE | Freq: Every day | ORAL | Status: DC

## 2014-01-07 MED ORDER — IOHEXOL 300 MG/ML  SOLN
100.0000 mL | Freq: Once | INTRAMUSCULAR | Status: AC | PRN
  Administered 2014-01-07: 100 mL via INTRAVENOUS

## 2014-01-07 MED ORDER — PROMETHAZINE HCL 25 MG PO TABS: 25.0000 mg | ORAL_TABLET | Freq: Four times a day (QID) | ORAL | Status: DC | PRN

## 2014-01-07 MED ORDER — PANTOPRAZOLE SODIUM 40 MG IV SOLR
40.0000 mg | Freq: Once | INTRAVENOUS | Status: AC
Start: 2014-01-07 — End: 2014-01-07
  Administered 2014-01-07: 40 mg via INTRAVENOUS
  Filled 2014-01-07: qty 40

## 2014-01-07 NOTE — ED Provider Notes (Signed)
CSN: [id number]     Arrival date & time 01/07/14  1207 History  This chart was scribed for Nichole Lennert, MD by Milly Jakob, ED Scribe. The patient was seen in room APA08/APA08. Patient's care was started at 1:34 PM.   Chief Complaint  Patient presents with  . Abdominal Pain   Patient is a 43 y.o. female presenting with abdominal pain. The history is provided by the patient. No language interpreter was used.  Abdominal Pain Pain location:  Epigastric Pain quality: aching and sharp   Pain radiates to:  Does not radiate Pain severity:  Moderate Timing:  Constant Progression:  Unchanged Chronicity:  New Relieved by:  Nothing Worsened by:  Nothing tried Ineffective treatments:  None tried Associated symptoms: no chest pain, no cough, no diarrhea, no fatigue and no hematuria    HPI Comments: Nichole [name] is a 43 y.o. female with a history of COPD who presents to the Emergency Department complaining of constant epigastric abdominal pain. She also reports constant lower back pain. She reports that her last BM was this morning. She reports a baseline cough. She denies symptoms like this, or having her gallbladder examined, in the past.   Past Medical History  Diagnosis Date  . Bipolar 1 disorder   . Panic attacks   . Asthma   . COPD (chronic obstructive pulmonary disease)   . Manic depression    Past Surgical History  Procedure Laterality Date  . Tonsillectomy    . Cesarean section    . Cyst removed from wrist    . Tubal ligation     No family history on file. History  Substance Use Topics  . Smoking status: Current Every Day Smoker -- 1.00 packs/day    Types: Cigarettes  . Smokeless tobacco: Not on file  . Alcohol Use: No   OB History   Grav Para Term Preterm Abortions TAB SAB Ect Mult Living                 Review of Systems  Constitutional: Negative for appetite change and fatigue.  HENT: Negative for congestion, ear discharge and sinus pressure.   Eyes:  Negative for discharge.  Respiratory: Negative for cough.   Cardiovascular: Negative for chest pain.  Gastrointestinal: Positive for abdominal pain. Negative for diarrhea.  Genitourinary: Negative for frequency and hematuria.  Musculoskeletal: Positive for back pain.  Skin: Negative for rash.  Neurological: Negative for seizures and headaches.  Psychiatric/Behavioral: Negative for hallucinations.    Allergies  Bee venom and Percocet  Home Medications   Prior to Admission medications   Medication Sig Start Date End Date Taking? Authorizing Provider  albuterol (PROVENTIL HFA;VENTOLIN HFA) 108 (90 BASE) MCG/ACT inhaler Inhale 2 puffs into the lungs every 6 (six) hours as needed for wheezing or shortness of breath.   Yes Historical Provider, MD  Aspirin-Salicylamide-Caffeine (BC HEADACHE POWDER PO) Take 1 packet by mouth 4 (four) times daily as needed (headache).   Yes Historical Provider, MD  atenolol (TENORMIN) 25 MG tablet Take 25 mg by mouth daily.   Yes Historical Provider, MD  busPIRone (BUSPAR) 10 MG tablet Take 10 mg by mouth 3 (three) times daily.   Yes Historical Provider, MD  citalopram (CELEXA) 20 MG tablet Take 20 mg by mouth daily.   Yes Historical Provider, MD  diazepam (VALIUM) 5 MG tablet Take one pill every 12 hours as needed for nerves 03/17/13  Yes Nichole Lennert, MD  EPINEPHrine (EPIPEN) 0.3 mg/0.3 mL  IJ SOAJ injection Inject 0.3 mLs (0.3 mg total) into the muscle once. 11/24/13  Yes Hope Orlene Och, NP  famotidine (PEPCID) 20 MG tablet Take 1 tablet (20 mg total) by mouth 2 (two) times daily. 11/24/13  Yes Hope Orlene Och, NP  ibuprofen (ADVIL,MOTRIN) 400 MG tablet Take 400 mg by mouth every 8 (eight) hours as needed for headache.    Yes Historical Provider, MD   Triage Vitals: BP 144/84  Pulse 117  Temp(Src) 97.5 F (36.4 C) (Oral)  Resp 18  Ht 5' 6.5" (1.689 m)  Wt 187 lb (84.823 kg)  BMI 29.73 kg/m2  SpO2 97%  LMP 11/08/2013 Physical Exam  Constitutional: She is  oriented to person, place, and time. She appears well-developed.  HENT:  Head: Normocephalic.  Eyes: Conjunctivae and EOM are normal. No scleral icterus.  Neck: Neck supple. No thyromegaly present.  Cardiovascular: Regular rhythm.  Tachycardia present.  Exam reveals no gallop and no friction rub.   No murmur heard. Pulmonary/Chest: No stridor. She has wheezes (mild, bilateral). She has no rales. She exhibits no tenderness.  Abdominal: She exhibits no distension. There is tenderness (moderate epigastric). There is no rebound.  Musculoskeletal: Normal range of motion. She exhibits no edema.  Lymphadenopathy:    She has no cervical adenopathy.  Neurological: She is oriented to person, place, and time. She exhibits normal muscle tone. Coordination normal.  Skin: No rash noted. No erythema.  Psychiatric: She has a normal mood and affect. Her behavior is normal.    ED Course  Procedures (including critical care time) DIAGNOSTIC STUDIES: Oxygen Saturation is 97% on room air, normal by my interpretation.    COORDINATION OF CARE: 1:39 PM-Discussed treatment plan which includes X-Ray with pt at bedside and pt agreed to plan.   Labs Review Labs Reviewed  CBC WITH DIFFERENTIAL - Abnormal; Notable for the following:    WBC 11.5 (*)    Neutro Abs 8.4 (*)    All other components within normal limits  COMPREHENSIVE METABOLIC PANEL - Abnormal; Notable for the following:    Glucose, Bld 106 (*)    Total Bilirubin <0.2 (*)    All other components within normal limits  URINALYSIS, ROUTINE W REFLEX MICROSCOPIC    Imaging Review No results found.   EKG Interpretation None      MDM   Final diagnoses:  None    The chart was scribed for me under my direct supervision.  I personally performed the history, physical, and medical decision making and all procedures in the evaluation of this patient.Nichole Lennert, MD 01/07/14 [phone]

## 2014-01-07 NOTE — ED Notes (Signed)
Pt presents to the ED c/o "severe abdominal and lower back pain." Denies any injury or n/v/d. Pt states last BM was this morning.

## 2014-01-07 NOTE — ED Notes (Signed)
Pt denies burning on urination, pt admits to taking a gabapentin last night without relief

## 2014-01-07 NOTE — Discharge Instructions (Signed)
Follow up with your provider in one week

## 2014-07-08 ENCOUNTER — Emergency Department (HOSPITAL_COMMUNITY): Payer: Self-pay

## 2014-07-08 ENCOUNTER — Emergency Department (HOSPITAL_COMMUNITY)
Admission: EM | Admit: 2014-07-08 | Discharge: 2014-07-08 | Disposition: A | Discharge status: Home / Self Care | Payer: Self-pay | Attending: Emergency Medicine | Admitting: Emergency Medicine

## 2014-07-08 ENCOUNTER — Encounter (HOSPITAL_COMMUNITY): Payer: Self-pay | Admitting: Emergency Medicine

## 2014-07-08 DIAGNOSIS — R634 Abnormal weight loss: Secondary | ICD-10-CM | POA: Insufficient documentation

## 2014-07-08 DIAGNOSIS — Z79899 Other long term (current) drug therapy: Secondary | ICD-10-CM | POA: Insufficient documentation

## 2014-07-08 DIAGNOSIS — R05 Cough: Secondary | ICD-10-CM

## 2014-07-08 DIAGNOSIS — R059 Cough, unspecified: Secondary | ICD-10-CM

## 2014-07-08 DIAGNOSIS — F410 Panic disorder [episodic paroxysmal anxiety] without agoraphobia: Secondary | ICD-10-CM | POA: Insufficient documentation

## 2014-07-08 DIAGNOSIS — J449 Chronic obstructive pulmonary disease, unspecified: Secondary | ICD-10-CM | POA: Insufficient documentation

## 2014-07-08 DIAGNOSIS — Z720 Tobacco use: Secondary | ICD-10-CM | POA: Insufficient documentation

## 2014-07-08 DIAGNOSIS — F319 Bipolar disorder, unspecified: Secondary | ICD-10-CM | POA: Insufficient documentation

## 2014-07-08 MED ORDER — DOXYCYCLINE HYCLATE 100 MG PO CAPS: 100.0000 mg | ORAL_CAPSULE | Freq: Two times a day (BID) | ORAL | Status: DC

## 2014-07-08 MED ORDER — PREDNISONE 50 MG PO TABS
60.0000 mg | ORAL_TABLET | Freq: Once | ORAL | Status: AC
  Administered 2014-07-08: 60 mg via ORAL
  Filled 2014-07-08 (×2): qty 1

## 2014-07-08 MED ORDER — PREDNISONE 20 MG PO TABS: 40.0000 mg | ORAL_TABLET | Freq: Every day | ORAL | Status: DC

## 2014-07-08 NOTE — Discharge Instructions (Signed)
Follow-up with primary care doctor further evaluation of your difficulty swallowing and weight gain.  Cough, Adult  A cough is a reflex that helps clear your throat and airways. It can help heal the body or may be a reaction to an irritated airway. A cough may only last 2 or 3 weeks (acute) or may last more than 8 weeks (chronic).  CAUSES Acute cough:  Viral or bacterial infections. Chronic cough:  Infections.  Allergies.  Asthma.  Post-nasal drip.  Smoking.  Heartburn or acid reflux.  Some medicines.  Chronic lung problems (COPD).  Cancer. SYMPTOMS   Cough.  Fever.  Chest pain.  Increased breathing rate.  High-pitched whistling sound when breathing (wheezing).  Colored mucus that you cough up (sputum). TREATMENT   A bacterial cough may be treated with antibiotic medicine.  A viral cough must run its course and will not respond to antibiotics.  Your caregiver may recommend other treatments if you have a chronic cough. HOME CARE INSTRUCTIONS   Only take over-the-counter or prescription medicines for pain, discomfort, or fever as directed by your caregiver. Use cough suppressants only as directed by your caregiver.  Use a cold steam vaporizer or humidifier in your bedroom or home to help loosen secretions.  Sleep in a semi-upright position if your cough is worse at night.  Rest as needed.  Stop smoking if you smoke. SEEK IMMEDIATE MEDICAL CARE IF:  1. You have pus in your sputum. 2. Your cough starts to worsen. 3. You cannot control your cough with suppressants and are losing sleep. 4. You begin coughing up blood. 5. You have difficulty breathing. 6. You develop pain which is getting worse or is uncontrolled with medicine. 7. You have a fever. MAKE SURE YOU:   Understand these instructions.  Will watch your condition.  Will get help right away if you are not doing well or get worse. Document Released: 10/16/2010 Document Revised: 07/12/2011  Document Reviewed: 10/16/2010 [facility] Patient Information 2015 [place], [place]. This information is not intended to replace advice given to you by your health care provider. Make sure you discuss any questions you have with your health care provider.  Chronic Obstructive Pulmonary Disease Chronic obstructive pulmonary disease (COPD) is a common lung condition in which airflow from the lungs is limited. COPD is a general term that can be used to describe many different lung problems that limit airflow, including both chronic bronchitis and emphysema. If you have COPD, your lung function will probably never return to normal, but there are measures you can take to improve lung function and make yourself feel better.  CAUSES   Smoking (common).   Exposure to secondhand smoke.   Genetic problems.  Chronic inflammatory lung diseases or recurrent infections. SYMPTOMS   Shortness of breath, especially with physical activity.   Deep, persistent (chronic) cough with a large amount of thick mucus.   Wheezing.   Rapid breaths (tachypnea).   Gray or bluish discoloration (cyanosis) of the skin, especially in fingers, toes, or lips.   Fatigue.   Weight loss.   Frequent infections or episodes when breathing symptoms become much worse (exacerbations).   Chest tightness. DIAGNOSIS  Your health care provider will take a medical history and perform a physical examination to make the initial diagnosis. Additional tests for COPD may include:   Lung (pulmonary) function tests.  Chest X-ray.  CT scan.  Blood tests. TREATMENT  Treatment available to help you feel better when you have COPD includes:   Inhaler  and nebulizer medicines. These help manage the symptoms of COPD and make your breathing more comfortable.  Supplemental oxygen. Supplemental oxygen is only helpful if you have a low oxygen level in your blood.   Exercise and physical activity. These are beneficial for  nearly all people with COPD. Some people may also benefit from a pulmonary rehabilitation program. HOME CARE INSTRUCTIONS   Take all medicines (inhaled or pills) as directed by your health care provider.  Avoid over-the-counter medicines or cough syrups that dry up your airway (such as antihistamines) and slow down the elimination of secretions unless instructed otherwise by your health care provider.   If you are a smoker, the most important thing that you can do is stop smoking. Continuing to smoke will cause further lung damage and breathing trouble. Ask your health care provider for help with quitting smoking. He or she can direct you to community resources or hospitals that provide support.  Avoid exposure to irritants such as smoke, chemicals, and fumes that aggravate your breathing.  Use oxygen therapy and pulmonary rehabilitation if directed by your health care provider. If you require home oxygen therapy, ask your health care provider whether you should purchase a pulse oximeter to measure your oxygen level at home.   Avoid contact with individuals who have a contagious illness.  Avoid extreme temperature and humidity changes.  Eat healthy foods. Eating smaller, more frequent meals and resting before meals may help you maintain your strength.  Stay active, but balance activity with periods of rest. Exercise and physical activity will help you maintain your ability to do things you want to do.  Preventing infection and hospitalization is very important when you have COPD. Make sure to receive all the vaccines your health care provider recommends, especially the pneumococcal and influenza vaccines. Ask your health care provider whether you need a pneumonia vaccine.  Learn and use relaxation techniques to manage stress.  Learn and use controlled breathing techniques as directed by your health care provider. Controlled breathing techniques include:   Pursed lip breathing. Start by  breathing in (inhaling) through your nose for 1 second. Then, purse your lips as if you were going to whistle and breathe out (exhale) through the pursed lips for 2 seconds.   Diaphragmatic breathing. Start by putting one hand on your abdomen just above your waist. Inhale slowly through your nose. The hand on your abdomen should move out. Then purse your lips and exhale slowly. You should be able to feel the hand on your abdomen moving in as you exhale.   Learn and use controlled coughing to clear mucus from your lungs. Controlled coughing is a series of short, progressive coughs. The steps of controlled coughing are:  8. Lean your head slightly forward.  9. Breathe in deeply using diaphragmatic breathing.  10. Try to hold your breath for 3 seconds.  11. Keep your mouth slightly open while coughing twice.  12. Spit any mucus out into a tissue.  13. Rest and repeat the steps once or twice as needed. SEEK MEDICAL CARE IF:   You are coughing up more mucus than usual.   There is a change in the color or thickness of your mucus.   Your breathing is more labored than usual.   Your breathing is faster than usual.  SEEK IMMEDIATE MEDICAL CARE IF:   You have shortness of breath while you are resting.   You have shortness of breath that prevents you from:  Being able to talk.  Performing your usual physical activities.   You have chest pain lasting longer than 5 minutes.   Your skin color is more cyanotic than usual.  You measure low oxygen saturations for longer than 5 minutes with a pulse oximeter. MAKE SURE YOU:   Understand these instructions.  Will watch your condition.  Will get help right away if you are not doing well or get worse. Document Released: 01/27/2005 Document Revised: 09/03/2013 Document Reviewed: 12/14/2012 [facility] Patient Information 2015 [place], [place]. This information is not intended to replace advice given to you by your health care  provider. Make sure you discuss any questions you have with your health care provider.

## 2014-07-08 NOTE — ED Notes (Signed)
Pt reports sore throat,cough x1 month. nad noted. Pt denies any known fevers.

## 2014-07-08 NOTE — ED Provider Notes (Signed)
CSN: [id number]     Arrival date & time 07/08/14  0710 History  This chart was scribed for American Expressathan R. Rubin PayorPickering, MD by Abel PrestoKara Demonbreun, ED Scribe. This patient was seen in room APA17/APA17 and the patient's care was started at 7:34 AM.    Chief Complaint  Patient presents with  . Cough     Patient is a 44 y.o. female presenting with cough. The history is provided by the patient. No language interpreter was used.  Cough Associated symptoms: sore throat   Associated symptoms: no chills and no fever    HPI Comments: Nichole [name] is a 44 y.o. female who presents to the Emergency Department complaining of cough for over a month. Pt notes associated congestion, sore throat, and difficulty swallowing. Pt reports she has not been eating much but notes significant weight gain. Pt has tried decongestants for relief.  Pt is a smoker with h/o COPD. Pt denies fever.   Past Medical History  Diagnosis Date  . Bipolar 1 disorder   . Panic attacks   . Asthma   . COPD (chronic obstructive pulmonary disease)   . Manic depression    Past Surgical History  Procedure Laterality Date  . Tonsillectomy    . Cesarean section    . Cyst removed from wrist    . Tubal ligation     History reviewed. No pertinent family history. History  Substance Use Topics  . Smoking status: Current Every Day Smoker -- 1.00 packs/day    Types: Cigarettes  . Smokeless tobacco: Not on file  . Alcohol Use: No   OB History    No data available     Review of Systems  Constitutional: Positive for unexpected weight change. Negative for fever and chills.  HENT: Positive for congestion, sore throat and trouble swallowing.   Respiratory: Positive for cough.       Allergies  Bee venom and Percocet  Home Medications   Prior to Admission medications   Medication Sig Start Date End Date Taking? Authorizing Provider  albuterol (PROVENTIL HFA;VENTOLIN HFA) 108 (90 BASE) MCG/ACT inhaler Inhale 2 puffs into the lungs every  6 (six) hours as needed for wheezing or shortness of breath.    Historical Provider, MD  Aspirin-Salicylamide-Caffeine (BC HEADACHE POWDER PO) Take 1 packet by mouth 4 (four) times daily as needed (headache).    Historical Provider, MD  atenolol (TENORMIN) 25 MG tablet Take 25 mg by mouth daily.    Historical Provider, MD  busPIRone (BUSPAR) 10 MG tablet Take 10 mg by mouth 3 (three) times daily.    Historical Provider, MD  citalopram (CELEXA) 20 MG tablet Take 20 mg by mouth daily.    Historical Provider, MD  diazepam (VALIUM) 5 MG tablet Take one pill every 12 hours as needed for nerves 03/17/13   Bethann BerkshireJoseph Zammit, MD  doxycycline (VIBRAMYCIN) 100 MG capsule Take 1 capsule (100 mg total) by mouth 2 (two) times daily. 07/08/14   Benjiman CoreNathan Krishawna Stiefel, MD  EPINEPHrine (EPIPEN) 0.3 mg/0.3 mL IJ SOAJ injection Inject 0.3 mLs (0.3 mg total) into the muscle once. 11/24/13   Hope Orlene OchM Neese, NP  famotidine (PEPCID) 20 MG tablet Take 1 tablet (20 mg total) by mouth 2 (two) times daily. 11/24/13   Hope Orlene OchM Neese, NP  ibuprofen (ADVIL,MOTRIN) 400 MG tablet Take 400 mg by mouth every 8 (eight) hours as needed for headache.     Historical Provider, MD  predniSONE (DELTASONE) 20 MG tablet Take 2 tablets (40 mg  total) by mouth daily. 07/08/14   Benjiman Core, MD  promethazine (PHENERGAN) 25 MG tablet Take 1 tablet (25 mg total) by mouth every 6 (six) hours as needed for nausea or vomiting. 01/07/14   Bethann Berkshire, MD  ranitidine (ZANTAC) 150 MG capsule Take 1 capsule (150 mg total) by mouth daily. 01/07/14   Bethann Berkshire, MD  traMADol (ULTRAM) 50 MG tablet Take 1 tablet (50 mg total) by mouth every 6 (six) hours as needed. 01/07/14   Bethann Berkshire, MD   BP 106/79 mmHg  Pulse 75  Temp(Src) 97.8 F (36.6 C) (Oral)  Resp 16  Ht  (1.702 m)  Wt 185 lb (83.915 kg)  BMI 28.97 kg/m2  SpO2 97%  LMP 06/28/2014 Physical Exam  Constitutional: She is oriented to person, place, and time. She appears well-developed and  well-nourished.  HENT:  Head: Normocephalic.  Mouth/Throat: Posterior oropharyngeal erythema present.  Eyes: Conjunctivae are normal.  Neck: Normal range of motion. Neck supple. No thyromegaly present.  Cardiovascular: Normal rate, regular rhythm and normal heart sounds.   Pulmonary/Chest: Effort normal and breath sounds normal. No respiratory distress. She has no wheezes. She has no rhonchi. She has no rales.  Abdominal: Soft. There is no tenderness.  Musculoskeletal: Normal range of motion. She exhibits no edema.  Neurological: She is alert and oriented to person, place, and time.  Skin: Skin is warm and dry.  Psychiatric: She has a normal mood and affect. Her behavior is normal.  Nursing note and vitals reviewed.   ED Course  Procedures (including critical care time) DIAGNOSTIC STUDIES: Oxygen Saturation is 98% on room air, normal by my interpretation.    COORDINATION OF CARE: 7:37 AM Discussed treatment plan with patient at beside, the patient agrees with the plan and has no further questions at this time.   Labs Review Labs Reviewed - No data to display  Imaging Review Dg Chest 2 View  07/08/2014   CLINICAL DATA:  Cough for 1 month, asthma, COPD  EXAM: CHEST  2 VIEW  COMPARISON:  Radiographs 07/02/2013  FINDINGS: Normal mediastinum and cardiac silhouette. Normal pulmonary vasculature. No evidence of effusion, infiltrate, or pneumothorax. No acute bony abnormality.  IMPRESSION: No acute cardiopulmonary process.   Electronically Signed   By: Genevive Bi M.D.   On: 07/08/2014 08:08     EKG Interpretation None      MDM   Final diagnoses:  Cough  Chronic obstructive pulmonary disease, unspecified COPD, unspecified chronic bronchitis type   Patient with cough for the last month. Likely COPD exacerbation. Will treat with steroids and antibiotics. Will discharge home. I personally performed the services described in this documentation, which was scribed in my presence.  The recorded information has been reviewed and is accurate.     Benjiman Core, MD 07/08/14 1538

## 2014-09-09 ENCOUNTER — Encounter (HOSPITAL_COMMUNITY): Payer: Self-pay | Admitting: Cardiology

## 2014-09-09 ENCOUNTER — Emergency Department (HOSPITAL_COMMUNITY)
Admission: EM | Admit: 2014-09-09 | Discharge: 2014-09-09 | Disposition: A | Discharge status: Home / Self Care | Payer: Self-pay | Attending: Emergency Medicine | Admitting: Emergency Medicine

## 2014-09-09 DIAGNOSIS — G43909 Migraine, unspecified, not intractable, without status migrainosus: Secondary | ICD-10-CM | POA: Insufficient documentation

## 2014-09-09 DIAGNOSIS — J449 Chronic obstructive pulmonary disease, unspecified: Secondary | ICD-10-CM | POA: Insufficient documentation

## 2014-09-09 DIAGNOSIS — Z720 Tobacco use: Secondary | ICD-10-CM | POA: Insufficient documentation

## 2014-09-09 NOTE — ED Notes (Signed)
Migraine since yesterday.  Vomiting.

## 2017-10-09 ENCOUNTER — Other Ambulatory Visit: Payer: Self-pay

## 2017-10-09 ENCOUNTER — Emergency Department (HOSPITAL_COMMUNITY): Payer: BLUE CROSS/BLUE SHIELD

## 2017-10-09 ENCOUNTER — Encounter (HOSPITAL_COMMUNITY): Payer: Self-pay

## 2017-10-09 ENCOUNTER — Emergency Department (HOSPITAL_COMMUNITY)
Admission: EM | Admit: 2017-10-09 | Discharge: 2017-10-09 | Disposition: A | Discharge status: Home / Self Care | Payer: BLUE CROSS/BLUE SHIELD | Attending: Emergency Medicine | Admitting: Emergency Medicine

## 2017-10-09 DIAGNOSIS — F17210 Nicotine dependence, cigarettes, uncomplicated: Secondary | ICD-10-CM | POA: Insufficient documentation

## 2017-10-09 DIAGNOSIS — J45909 Unspecified asthma, uncomplicated: Secondary | ICD-10-CM | POA: Diagnosis not present

## 2017-10-09 DIAGNOSIS — M25551 Pain in right hip: Secondary | ICD-10-CM | POA: Diagnosis present

## 2017-10-09 DIAGNOSIS — M5431 Sciatica, right side: Secondary | ICD-10-CM | POA: Diagnosis not present

## 2017-10-09 DIAGNOSIS — Z79899 Other long term (current) drug therapy: Secondary | ICD-10-CM | POA: Insufficient documentation

## 2017-10-09 LAB — POC URINE PREG, ED: PREG TEST UR: NEGATIVE

## 2017-10-09 MED ORDER — TRAMADOL HCL 50 MG PO TABS
50.0000 mg | ORAL_TABLET | Freq: Once | ORAL | Status: AC
  Administered 2017-10-09: 50 mg via ORAL
  Filled 2017-10-09: qty 1

## 2017-10-09 MED ORDER — IBUPROFEN 800 MG PO TABS
800.0000 mg | ORAL_TABLET | Freq: Once | ORAL | Status: AC
  Administered 2017-10-09: 800 mg via ORAL
  Filled 2017-10-09: qty 1

## 2017-10-09 MED ORDER — IBUPROFEN 600 MG PO TABS: 600.0000 mg | ORAL_TABLET | Freq: Four times a day (QID) | ORAL | 0 refills | Status: DC | PRN

## 2017-10-09 MED ORDER — CYCLOBENZAPRINE HCL 10 MG PO TABS
10.0000 mg | ORAL_TABLET | Freq: Once | ORAL | Status: AC
  Administered 2017-10-09: 10 mg via ORAL
  Filled 2017-10-09: qty 1

## 2017-10-09 MED ORDER — CYCLOBENZAPRINE HCL 5 MG PO TABS: 5.0000 mg | ORAL_TABLET | Freq: Three times a day (TID) | ORAL | 0 refills | Status: DC | PRN

## 2017-10-09 MED ORDER — TRAMADOL HCL 50 MG PO TABS: 50.0000 mg | ORAL_TABLET | Freq: Four times a day (QID) | ORAL | 0 refills | Status: DC | PRN

## 2017-10-09 NOTE — ED Triage Notes (Signed)
Pt reports r hip pain since Wednesday, denies injury.

## 2017-10-09 NOTE — ED Notes (Signed)
Patient transported to X-ray 

## 2017-10-09 NOTE — ED Provider Notes (Signed)
[facility] EMERGENCY DEPARTMENT Provider Note   CSN: [id number] Arrival date & time: 10/09/17  0801     History   Chief Complaint Chief Complaint  Patient presents with  . Hip Pain    HPI Nichole [name] is a 47 y.o. female with a history of asthma, bipolar disease, COPD and sciatica, not currently on any medications as she has no PCP and just obtained medical insurance presenting with right hip pain which has been present for the past 4 days.  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  Her pain has become progressively worse over the past several days.  She attempted to go camping this weekend, woke this morning with difficulty getting off of the mattress.  She has taken generic Tylenol and Aleve without improvement in symptoms.  She denies weakness or numbness in her legs and has had no urinary or fecal incontinence or retention.  She denies fevers or chills, has no history of IV drug use.  No history of cancer.  The history is provided by the patient.    Past Medical History:  Diagnosis Date  . Asthma   . Bipolar 1 disorder (HCC)   . COPD (chronic obstructive pulmonary disease) (HCC)   . Manic depression (HCC)   . Panic attacks     Patient Active Problem List   Diagnosis Date Noted  . Chest pain 04/03/2013  . Acute chest pain 04/03/2013  . Sciatica 07/02/2009  . BACK PAIN, CHRONIC 07/02/2009    Past Surgical History:  Procedure Laterality Date  . CESAREAN SECTION    . cyst removed from wrist    . TONSILLECTOMY    . TUBAL LIGATION       OB History   None      Home Medications    Prior to Admission medications   Medication Sig Start Date End Date Taking? Authorizing Provider  Aspirin-Salicylamide-Caffeine (BC HEADACHE POWDER PO) Take 1 packet by mouth 4 (four) times daily as needed  (headache).   Yes [provider]  EPINEPHrine (EPIPEN) 0.3 mg/0.3 mL IJ SOAJ injection Inject 0.3 mLs (0.3 mg total) into the muscle once. 11/24/13  Yes Neese, Hope M, NP  famotidine (PEPCID) 20 MG tablet Take 1 tablet (20 mg total) by mouth 2 (two) times daily. Patient taking differently: Take 20 mg by mouth daily as needed.  11/24/13  Yes Neese, Hope M, NP  ibuprofen (ADVIL,MOTRIN) 400 MG tablet Take 400 mg by mouth every 8 (eight) hours as needed for headache.    Yes [provider]  albuterol (PROVENTIL HFA;VENTOLIN HFA) 108 (90 BASE) MCG/ACT inhaler Inhale 2 puffs into the lungs every 6 (six) hours as needed for wheezing or shortness of breath.    [provider]  atenolol (TENORMIN) 25 MG tablet Take 25 mg by mouth daily.    [provider]    Family History No family history on file.  Social History Social History   Tobacco Use  . Smoking status: Current Every Day Smoker    Packs/day: 1.00    Types: Cigarettes  . Smokeless tobacco: Never Used  Substance Use Topics  . Alcohol use: Yes    Comment: occ  . Drug use: No     Allergies   Bee venom and Percocet [oxycodone-acetaminophen]   Review of  Systems Review of Systems  Constitutional: Negative for fever.  Respiratory: Negative for shortness of breath.   Cardiovascular: Negative for chest pain and leg swelling.  Gastrointestinal: Negative for abdominal distention, abdominal pain and constipation.  Genitourinary: Negative for difficulty urinating, dysuria, flank pain, frequency and urgency.  Musculoskeletal: Positive for arthralgias and back pain. Negative for gait problem and joint swelling.  Skin: Negative for rash.  Neurological: Negative for weakness and numbness.     Physical Exam Updated Vital Signs BP (!) 122/99 (BP Location: Left Arm)   Pulse 86   Temp 97.7 F (36.5 C) (Oral)   Resp 18   Ht 5\' 8"  (1.727 m)   Wt 72.6 kg (160 lb)   LMP 09/28/2017   SpO2 99%   BMI 24.33  kg/m   Physical Exam  Constitutional: She appears well-developed and well-nourished.  HENT:  Head: Normocephalic.  Eyes: Conjunctivae are normal.  Neck: Normal range of motion. Neck supple.  Cardiovascular: Normal rate and intact distal pulses.  Pedal pulses normal.  Pulmonary/Chest: Effort normal.  Abdominal: Soft. Bowel sounds are normal. She exhibits no distension and no mass.  Musculoskeletal: Normal range of motion. She exhibits no edema.       Lumbar back: She exhibits tenderness. She exhibits no swelling, no edema and no spasm.       Back:  Tender to palpation right mid buttocks, right lateral hip.  She has moderate pain with internal and external rotation of her right hip joint although her pain is at her lateral hip/trochanteric region, not groin pain.  No midline lumbar tenderness to palpation.  Neurological: She is alert. She has normal strength. She displays no atrophy and no tremor. No sensory deficit. Gait normal.  Reflex Scores:      Patellar reflexes are 2+ on the right side and 2+ on the left side. No strength deficit noted in hip and knee flexor and extensor muscle groups.  Ankle flexion and extension intact.  Skin: Skin is warm and dry.  Psychiatric: She has a normal mood and affect.  Nursing note and vitals reviewed.    ED Treatments / Results  Labs (all labs ordered are listed, but only abnormal results are displayed) Labs Reviewed  POC URINE PREG, ED    EKG None  Radiology Dg Lumbar Spine Complete  Result Date: 10/09/2017 CLINICAL DATA:  Right back pain radiating to right hip for 4 days. EXAM: LUMBAR SPINE - COMPLETE 4+ VIEW COMPARISON:  None. FINDINGS: There is no evidence of lumbar spine fracture. Alignment is normal. Intervertebral disc spaces are maintained. No significant facet arthropathy or other bone lesions. Minimal lumbar levoscoliosis noted. IMPRESSION: Minimal lumbar levoscoliosis.  Otherwise negative. Electronically Signed   By: Myles Rosenthal  M.D.   On: 10/09/2017 10:33   Dg Hip Unilat W Or Wo Pelvis 2-3 Views Right  Result Date: 10/09/2017 CLINICAL DATA:  Right hip pain. EXAM: DG HIP (WITH OR WITHOUT PELVIS) 2-3V RIGHT COMPARISON:  None. FINDINGS: There is no evidence of hip fracture or dislocation. There is no evidence of arthropathy or other focal bone abnormality. IMPRESSION: Negative. Electronically Signed   By: Myles Rosenthal M.D.   On: 10/09/2017 10:34    Procedures Procedures (including critical care time)  Medications Ordered in ED Medications  traMADol (ULTRAM) tablet 50 mg (50 mg Oral Given 10/09/17 0848)     Initial Impression / Assessment and Plan / ED Course  I have reviewed the triage vital signs and the nursing notes.  Pertinent  labs & imaging results that were available during my care of the patient were reviewed by me and considered in my medical decision making (see chart for details).     Patient with suspected recurrence of her sciatica, also with right lateral greater trochanteric pain, possibly right trochanteric bursitis.  She was prescribed tramadol, also prescribed a muscle relaxer and anti-inflammatory.  Discussed heat therapy, activity as tolerated.  She was referred to orthopedics for follow-up care if symptoms persist or worsen.  Her imaging was reviewed and discussed with her prior to discharge home.  She has no neurologic deficits on today's exam.  The [place] database was reviewed prior to discharge home.  Final Clinical Impressions(s) / ED Diagnoses   Final diagnoses:  Sciatica of right side  Acute pain of right hip    ED Discharge Orders    None       Victoriano Laindol, Malinda Mayden, PA-C 10/09/17 1053    Donnetta Hutchingook, Brian, MD 10/10/17 1049

## 2017-10-09 NOTE — Discharge Instructions (Addendum)
Use the medicines prescribed for pain and inflammation.  Apply a heating pad 20 minutes several times daily. Use the the other medicines as directed.  Do not drive within 4 hours of taking tramadol as this will make you drowsy.  Avoid lifting,  Bending,  Twisting or any other activity that worsens your pain over the next week.   You should get rechecked if your symptoms are not better over the next 5 days,  Or you develop increased pain,  Weakness in your leg(s) or loss of bladder or bowel function - these are symptoms of a worsening condition.

## 2017-10-13 ENCOUNTER — Ambulatory Visit: Payer: BLUE CROSS/BLUE SHIELD | Admitting: Orthopaedic Surgery

## 2017-10-13 ENCOUNTER — Encounter: Payer: Self-pay | Admitting: Orthopaedic Surgery

## 2017-10-13 VITALS — BP 142/90 | HR 114 | Ht 67.0 in | Wt 152.0 lb

## 2017-10-13 DIAGNOSIS — M7061 Trochanteric bursitis, right hip: Secondary | ICD-10-CM | POA: Diagnosis not present

## 2017-10-13 DIAGNOSIS — G8929 Other chronic pain: Secondary | ICD-10-CM

## 2017-10-13 DIAGNOSIS — M5441 Lumbago with sciatica, right side: Secondary | ICD-10-CM

## 2017-10-13 MED ORDER — PREDNISONE 5 MG (21) PO TBPK: ORAL_TABLET | ORAL | 0 refills | Status: DC

## 2017-10-13 MED ORDER — CYCLOBENZAPRINE HCL 5 MG PO TABS: 5.0000 mg | ORAL_TABLET | Freq: Three times a day (TID) | ORAL | 0 refills | Status: DC | PRN

## 2017-10-13 MED ORDER — HYDROCODONE-ACETAMINOPHEN 5-325 MG PO TABS: ORAL_TABLET | ORAL | 0 refills | Status: DC

## 2017-10-13 NOTE — Patient Instructions (Addendum)
Steps to Quit Smoking Smoking tobacco can be bad for your health. It can also affect almost every organ in your body. Smoking puts you and people around you at risk for many serious Minnette Merida-lasting (chronic) diseases. Quitting smoking is hard, but it is one of the best things that you can do for your health. It is never too late to quit. What are the benefits of quitting smoking? When you quit smoking, you lower your risk for getting serious diseases and conditions. They can include:  Lung cancer or lung disease.  Heart disease.  Stroke.  Heart attack.  Not being able to have children (infertility).  Weak bones (osteoporosis) and broken bones (fractures).  If you have coughing, wheezing, and shortness of breath, those symptoms may get better when you quit. You may also get sick less often. If you are pregnant, quitting smoking can help to lower your chances of having a baby of low birth weight. What can I do to help me quit smoking? Talk with your doctor about what can help you quit smoking. Some things you can do (strategies) include:  Quitting smoking totally, instead of slowly cutting back how much you smoke over a period of time.  Going to in-person counseling. You are more likely to quit if you go to many counseling sessions.  Using resources and support systems, such as: ? Online chats with a counselor. ? Phone quitlines. ? Printed self-help materials. ? Support groups or group counseling. ? Text messaging programs. ? Mobile phone apps or applications.  Taking medicines. Some of these medicines may have nicotine in them. If you are pregnant or breastfeeding, do not take any medicines to quit smoking unless your doctor says it is okay. Talk with your doctor about counseling or other things that can help you.  Talk with your doctor about using more than one strategy at the same time, such as taking medicines while you are also going to in-person counseling. This can help make  quitting easier. What things can I do to make it easier to quit? Quitting smoking might feel very hard at first, but there is a lot that you can do to make it easier. Take these steps:  Talk to your family and friends. Ask them to support and encourage you.  Call phone quitlines, reach out to support groups, or work with a counselor.  Ask people who smoke to not smoke around you.  Avoid places that make you want (trigger) to smoke, such as: ? Bars. ? Parties. ? Smoke-break areas at work.  Spend time with people who do not smoke.  Lower the stress in your life. Stress can make you want to smoke. Try these things to help your stress: ? Getting regular exercise. ? Deep-breathing exercises. ? Yoga. ? Meditating. ? Doing a body scan. To do this, close your eyes, focus on one area of your body at a time from head to toe, and notice which parts of your body are tense. Try to relax the muscles in those areas.  Download or buy apps on your mobile phone or tablet that can help you stick to your quit plan. There are many free apps, such as QuitGuide from the CDC (Centers for Disease Control and Prevention). You can find more support from smokefree.gov and other websites.  This information is not intended to replace advice given to you by your health care provider. Make sure you discuss any questions you have with your health care provider. Document Released: 02/13/2009 Document   Revised: 12/16/2015 Document Reviewed: 09/03/2014 Elsevier Interactive Patient Education  2018 Elsevier Inc.   OUT OF WORK 

## 2017-10-13 NOTE — Progress Notes (Signed)
Subjective:    Patient ID: Nichole [name], female    DOB: 10/11/1970, 47 y.o.   MRN: [id number]  HPI She has history of lower back pain.  She had seen Dr. Romeo AppleHarrison for this in 2011.  She has recurrent pain that comes and goes.  Recently she has had episode of marked lower back pain that radiates to the right hip, right thigh and to the lateral right foot.  She has no trauma.  She does some lifting at her work.  She has no weakness.  She went to the ER on 10-09-17 for her back.  X-rays were done and were negative.  She was given Tramadol, ibuprofen and Flexeril. She continues to have pain.  She uses crutches to walk.  She has pain over the lateral right hip with no swelling or redness.   Review of Systems  Constitutional: Positive for activity change.  Respiratory: Positive for shortness of breath. Negative for cough.   Endocrine: Positive for cold intolerance.  Musculoskeletal: Positive for arthralgias, back pain, gait problem and myalgias.  Allergic/Immunologic: Positive for environmental allergies.  All other systems reviewed and are negative.  Past Medical History:  Diagnosis Date  . Arthritis   . Asthma   . Bipolar 1 disorder (HCC)   . COPD (chronic obstructive pulmonary disease) (HCC)   . COPD (chronic obstructive pulmonary disease) (HCC)   . Heart attack (HCC)   . Manic depression (HCC)   . Panic attacks     Past Surgical History:  Procedure Laterality Date  . CESAREAN SECTION    . cyst removed from wrist    . TONSILLECTOMY    . TUBAL LIGATION      Current Outpatient Medications on File Prior to Visit  Medication Sig Dispense Refill  . EPINEPHrine (EPIPEN) 0.3 mg/0.3 mL IJ SOAJ injection Inject 0.3 mLs (0.3 mg total) into the muscle once. 1 Device 0  . ibuprofen (ADVIL,MOTRIN) 600 MG tablet Take 1 tablet (600 mg total) by mouth every 6 (six) hours as needed. 30 tablet 0  . albuterol (PROVENTIL HFA;VENTOLIN HFA) 108 (90 BASE) MCG/ACT inhaler Inhale 2 puffs into the  lungs every 6 (six) hours as needed for wheezing or shortness of breath.    . Aspirin-Salicylamide-Caffeine (BC HEADACHE POWDER PO) Take 1 packet by mouth 4 (four) times daily as needed (headache).    Marland Kitchen. atenolol (TENORMIN) 25 MG tablet Take 25 mg by mouth daily.    . famotidine (PEPCID) 20 MG tablet Take 1 tablet (20 mg total) by mouth 2 (two) times daily. (Patient not taking: Reported on 10/13/2017) 30 tablet 0   No current facility-administered medications on file prior to visit.     Social History   Socioeconomic History  . Marital status: Married    Spouse name: Not on file  . Number of children: Not on file  . Years of education: Not on file  . Highest education level: Not on file  Occupational History  . Not on file  Social Needs  . Financial resource strain: Not on file  . Food insecurity:    Worry: Not on file    Inability: Not on file  . Transportation needs:    Medical: Not on file    Non-medical: Not on file  Tobacco Use  . Smoking status: Current Every Day Smoker    Packs/day: 1.00    Types: Cigarettes  . Smokeless tobacco: Never Used  Substance and Sexual Activity  . Alcohol use: Yes  Comment: occ  . Drug use: No  . Sexual activity: Yes  Lifestyle  . Physical activity:    Days per week: Not on file    Minutes per session: Not on file  . Stress: Not on file  Relationships  . Social connections:    Talks on phone: Not on file    Gets together: Not on file    Attends religious service: Not on file    Active member of club or organization: Not on file    Attends meetings of clubs or organizations: Not on file    Relationship status: Not on file  . Intimate partner violence:    Fear of current or ex partner: Not on file    Emotionally abused: Not on file    Physically abused: Not on file    Forced sexual activity: Not on file  Other Topics Concern  . Not on file  Social History Narrative  . Not on file    Family History  Problem Relation Age of  Onset  . Heart disease Mother   . Mental illness Mother   . Hypertension Mother   . Lung disease Father   . Heart disease Father   . Cancer Brother     BP (!) 142/90   Pulse (!) 114   Ht 5\' 7"  (1.702 m)   Wt 152 lb (68.9 kg)   LMP 09/28/2017   BMI 23.81 kg/m      Objective:   Physical Exam  Constitutional: She is oriented to person, place, and time. She appears well-developed and well-nourished.  HENT:  Head: Normocephalic and atraumatic.  Eyes: Pupils are equal, round, and reactive to light. Conjunctivae and EOM are normal.  Neck: Normal range of motion. Neck supple.  Cardiovascular: Normal rate, regular rhythm and intact distal pulses.  Pulmonary/Chest: Effort normal.  Abdominal: Soft.  Musculoskeletal:       Lumbar back: She exhibits decreased range of motion and pain.       Back:  Neurological: She is alert and oriented to person, place, and time. She has normal reflexes. She displays normal reflexes. No cranial nerve deficit. She exhibits normal muscle tone. Coordination normal.  Skin: Skin is warm and dry.  Psychiatric: She has a normal mood and affect. Her behavior is normal. Judgment and thought content normal.     ER records, X-rays and reports reviewed.  Old records reviewed.     Assessment & Plan:   Encounter Diagnoses  Name Primary?  . 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 Trochanteric bursitis, right hip    PROCEDURE NOTE:  The patient request injection, verbal consent was obtained.  The right trochanteric area of the hip was prepped appropriately after time out was performed.   Sterile technique was observed and injection of 1 cc of Depo-Medrol 40 mg with several cc's of plain xylocaine. Anesthesia was provided by ethyl chloride and a 20-gauge needle was used to inject the hip area. The injection was tolerated well.  A band aid dressing was applied.  The patient was advised to apply ice later today and tomorrow to  the injection sight as needed.  I will call in pain medicine and refill her Flexeril and give prednisone dose pack.  Return in one week.  Note for out of work given.  Call if any problem.  Precautions discussed.   I have reviewed the [place] Controlled Substance Reporting System web site prior to prescribing narcotic medicine for this  patient.  Electronically Signed Darreld Mclean, MD 6/13/20198:52 AM

## 2017-10-20 ENCOUNTER — Encounter: Payer: Self-pay | Admitting: Orthopaedic Surgery

## 2017-10-20 ENCOUNTER — Ambulatory Visit: Payer: BLUE CROSS/BLUE SHIELD | Admitting: Orthopaedic Surgery

## 2017-10-20 VITALS — BP 133/85 | HR 120 | Ht 67.0 in | Wt 157.0 lb

## 2017-10-20 DIAGNOSIS — G8929 Other chronic pain: Secondary | ICD-10-CM

## 2017-10-20 DIAGNOSIS — M7061 Trochanteric bursitis, right hip: Secondary | ICD-10-CM | POA: Diagnosis not present

## 2017-10-20 DIAGNOSIS — M5441 Lumbago with sciatica, right side: Secondary | ICD-10-CM | POA: Diagnosis not present

## 2017-10-20 MED ORDER — IBUPROFEN 600 MG PO TABS: 600.0000 mg | ORAL_TABLET | Freq: Four times a day (QID) | ORAL | 2 refills | Status: DC | PRN

## 2017-10-20 NOTE — Progress Notes (Signed)
Patient Nichole [name], female DOB:05-Oct-1970, 47 y.o. VWU:[id number]  Chief Complaint  Patient presents with  . Follow-up    Low Back Pain    HPI  Nichole [name] is a 47 y.o. female who has right lower back pain and trochanteric bursitis on the right hip.  She is only slightly better.  She was told from work to stay out until she got better.  She is using a cane.  She is taking her ibuprofen.  I will begin PT.  She has no numbness, no weakness, no trauma. HPI  Body mass index is 24.59 kg/m.  ROS  Review of Systems  Constitutional: Positive for activity change.  Respiratory: Positive for shortness of breath. Negative for cough.   Endocrine: Positive for cold intolerance.  Musculoskeletal: Positive for arthralgias, back pain, gait problem and myalgias.  Allergic/Immunologic: Positive for environmental allergies.  All other systems reviewed and are negative.   Past Medical History:  Diagnosis Date  . Arthritis   . Asthma   . Bipolar 1 disorder (HCC)   . COPD (chronic obstructive pulmonary disease) (HCC)   . COPD (chronic obstructive pulmonary disease) (HCC)   . Heart attack (HCC)   . Manic depression (HCC)   . Panic attacks     Past Surgical History:  Procedure Laterality Date  . CESAREAN SECTION    . cyst removed from wrist    . TONSILLECTOMY    . TUBAL LIGATION      Family History  Problem Relation Age of Onset  . Heart disease Mother   . Mental illness Mother   . Hypertension Mother   . Lung disease Father   . Heart disease Father   . Cancer Brother     Social History Social History   Tobacco Use  . Smoking status: Current Every Day Smoker    Packs/day: 1.00    Types: Cigarettes  . Smokeless tobacco: Never Used  Substance Use Topics  . Alcohol use: Yes    Comment: occ  . Drug use: No    Allergies  Allergen Reactions  . Bee Venom Anaphylaxis and Hives  . Percocet [Oxycodone-Acetaminophen]     headache    Current Outpatient Medications   Medication Sig Dispense Refill  . albuterol (PROVENTIL HFA;VENTOLIN HFA) 108 (90 BASE) MCG/ACT inhaler Inhale 2 puffs into the lungs every 6 (six) hours as needed for wheezing or shortness of breath.    . Aspirin-Salicylamide-Caffeine (BC HEADACHE POWDER PO) Take 1 packet by mouth 4 (four) times daily as needed (headache).    Marland Kitchen atenolol (TENORMIN) 25 MG tablet Take 25 mg by mouth daily.    . cyclobenzaprine (FLEXERIL) 5 MG tablet Take 1 tablet (5 mg total) by mouth 3 (three) times daily as needed for muscle spasms. 30 tablet 0  . EPINEPHrine (EPIPEN) 0.3 mg/0.3 mL IJ SOAJ injection Inject 0.3 mLs (0.3 mg total) into the muscle once. 1 Device 0  . famotidine (PEPCID) 20 MG tablet Take 1 tablet (20 mg total) by mouth 2 (two) times daily. (Patient not taking: Reported on 10/13/2017) 30 tablet 0  . HYDROcodone-acetaminophen (NORCO/VICODIN) 5-325 MG tablet One tablet every four hours as needed for acute pain.  Limit of five days per [place] statue. 30 tablet 0  . ibuprofen (ADVIL,MOTRIN) 600 MG tablet Take 1 tablet (600 mg total) by mouth every 6 (six) hours as needed. 30 tablet 0  . predniSONE (STERAPRED UNI-PAK 21 TAB) 5 MG (21) TBPK tablet Take 6 pills first day;  5 pills second day; 4 pills third day; 3 pills fourth day; 2 pills next day and 1 pill last day. 21 tablet 0   No current facility-administered medications for this visit.      Physical Exam  Blood pressure 133/85, pulse (!) 120, height 5\' 7"  (1.702 m), weight 157 lb (71.2 kg), last menstrual period 09/28/2017.  Constitutional: overall normal hygiene, normal nutrition, well developed, normal grooming, normal body habitus. Assistive device:cane  Musculoskeletal: gait and station Limp right, muscle tone and strength are normal, no tremors or atrophy is present.  .  Neurological: coordination overall normal.  Deep tendon reflex/nerve stretch intact.  Sensation normal.  Cranial nerves II-XII intact.   Skin:   Normal overall no scars,  lesions, ulcers or rashes. No psoriasis.  Psychiatric: Alert and oriented x 3.  Recent memory intact, remote memory unclear.  Normal mood and affect. Well groomed.  Good eye contact.  Cardiovascular: overall no swelling, no varicosities, no edema bilaterally, normal temperatures of the legs and arms, no clubbing, cyanosis and good capillary refill.  Lymphatic: palpation is normal.  Right hip is just slightly tender, not as painful as last visit.  Spine/Pelvis examination:  Inspection:  Overall, sacoiliac joint benign and hips nontender; without crepitus or defects.   Thoracic spine inspection: Alignment normal without kyphosis present   Lumbar spine inspection:  Alignment  with normal lumbar lordosis, without scoliosis apparent.   Thoracic spine palpation:  without tenderness of spinal processes   Lumbar spine palpation: without tenderness of lumbar area; without tightness of lumbar muscles    Range of Motion:   Lumbar flexion, forward flexion is normal without pain or tenderness    Lumbar extension is full without pain or tenderness   Left lateral bend is normal without pain or tenderness   Right lateral bend is normal without pain or tenderness   Straight leg raising is normal  Strength & tone: normal   Stability overall normal stability All other systems reviewed and are negative   The patient has been educated about the nature of the problem(s) and counseled on treatment options.  The patient appeared to understand what I have discussed and is in agreement with it.  Encounter Diagnoses  Name Primary?  . 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 Trochanteric bursitis, right hip     PLAN Call if any problems.  Precautions discussed.  Continue current medications.   Return to clinic 2 weeks   Begin PT.  Stay out of work.  I have refilled her ibuprofen.  Electronically Signed Darreld McleanWayne Lenell Lama, MD 6/20/20192:02 PM

## 2017-10-20 NOTE — Patient Instructions (Addendum)
Physical therapy has been ordered for you at [facility] [phone] is the phone number to call if you want to call to schedule. Please let us know if you do not hear anything within one week.     Steps to Quit Smoking Smoking tobacco can be bad for your health. It can also affect almost every organ in your body. Smoking puts you and people around you at risk for many serious Nichole [name]-lasting (chronic) diseases. Quitting smoking is hard, but it is one of the best things that you can do for your health. It is never too late to quit. What are the benefits of quitting smoking? When you quit smoking, you lower your risk for getting serious diseases and conditions. They can include:  Lung cancer or lung disease.  Heart disease.  Stroke.  Heart attack.  Not being able to have children (infertility).  Weak bones (osteoporosis) and broken bones (fractures).  If you have coughing, wheezing, and shortness of breath, those symptoms may get better when you quit. You may also get sick less often. If you are pregnant, quitting smoking can help to lower your chances of having a baby of low birth weight. What can I do to help me quit smoking? Talk with your doctor about what can help you quit smoking. Some things you can do (strategies) include:  Quitting smoking totally, instead of slowly cutting back how much you smoke over a period of time.  Going to in-person counseling. You are more likely to quit if you go to many counseling sessions.  Using resources and support systems, such as: ? Online chats with a counselor. ? Phone quitlines. ? Printed self-help materials. ? Support groups or group counseling. ? Text messaging programs. ? Mobile phone apps or applications.  Taking medicines. Some of these medicines may have nicotine in them. If you are pregnant or breastfeeding, do not take any medicines to quit smoking unless your doctor says it is okay. Talk with your doctor about counseling or other  things that can help you.  Talk with your doctor about using more than one strategy at the same time, such as taking medicines while you are also going to in-person counseling. This can help make quitting easier. What things can I do to make it easier to quit? Quitting smoking might feel very hard at first, but there is a lot that you can do to make it easier. Take these steps:  Talk to your family and friends. Ask them to support and encourage you.  Call phone quitlines, reach out to support groups, or work with a counselor.  Ask people who smoke to not smoke around you.  Avoid places that make you want (trigger) to smoke, such as: ? Bars. ? Parties. ? Smoke-break areas at work.  Spend time with people who do not smoke.  Lower the stress in your life. Stress can make you want to smoke. Try these things to help your stress: ? Getting regular exercise. ? Deep-breathing exercises. ? Yoga. ? Meditating. ? Doing a body scan. To do this, close your eyes, focus on one area of your body at a time from head to toe, and notice which parts of your body are tense. Try to relax the muscles in those areas.  Download or buy apps on your mobile phone or tablet that can help you stick to your quit plan. There are many free apps, such as QuitGuide from the CDC (Centers for Disease Control and Prevention). You   can find more support from smokefree.gov and other websites.  This information is not intended to replace advice given to you by your health care provider. Make sure you discuss any questions you have with your health care provider. Document Released: 02/13/2009 Document Revised: 12/16/2015 Document Reviewed: 09/03/2014 Elsevier Interactive Patient Education  2018 Elsevier Inc.  

## 2017-11-01 ENCOUNTER — Ambulatory Visit (HOSPITAL_COMMUNITY): Payer: BLUE CROSS/BLUE SHIELD | Attending: Orthopaedic Surgery | Admitting: Physical Therapy

## 2017-11-01 ENCOUNTER — Encounter (HOSPITAL_COMMUNITY): Payer: Self-pay | Admitting: Physical Therapy

## 2017-11-01 ENCOUNTER — Other Ambulatory Visit: Payer: Self-pay

## 2017-11-01 DIAGNOSIS — M5416 Radiculopathy, lumbar region: Secondary | ICD-10-CM | POA: Diagnosis not present

## 2017-11-01 NOTE — Patient Instructions (Addendum)
Backward Bend (Standing)    Arch backward to make hollow of back deeper. Hold _3___ seconds. Repeat ___10_ times per set. Do __1__ sets per session. Do ___2_ sessions per day.  http://orth.exer.us/178   Copyright  VHI. All rights reserved.  Hamstring Stretch: Active    Support behind right knee. Starting with knee bent, attempt to straighten knee until a comfortable stretch is felt in back of thigh. Hold _30___ seconds. Repeat __3__ times per set. Do __1__ sets per session. Do __2__ sessions per day.  http://orth.exer.us/158   Copyright  VHI. All rights reserved.  Piriformis Stretch (All-Fours)    With right leg crossed in front, slide other leg back, lowering hips until stretch is felt. Repeat __3__ times per set. Do __1__ sets per session. Do ____1 sessions per day. Repeat to the opposite side  http://orth.exer.us/292   Copyright  VHI. All rights reserved.  Isometric Abdominal    Lying on back with knees bent, tighten stomach by pressing elbows down. Hold __5__ seconds. Repeat ___10_ times per set. Do __1__ sets per session. Do __2__ sessions per day.  http://orth.exer.us/1086   Copyright  VHI. All rights reserved.  Functional Quadriceps: Chair Squat    Keeping feet flat on floor, shoulder width apart, squat as low as is comfortable. Use support as necessary. Repeat __10__ times per set. Do 1____ sets per session. Do _2___ sessions per day.  http://orth.exer.us/736   Copyright  VHI. All rights reserved.  Functional Quadriceps: Sit to Stand    Sit on edge of chair, feet flat on floor. Stand upright, extending knees fully. Repeat _10___ times per set. Do _1___ sets per session. Do _2___ sessions per day.  http://orth.exer.us/734   Copyright  VHI. All rights reserved.

## 2017-11-01 NOTE — Therapy (Signed)
[facility] [address] [place], [place], [address] Phone: [phone]   Fax:  [phone]  Physical Therapy Evaluation  Patient Details  Name: Nichole [name] MRN: [id number] Date of Birth: 02/22/1971 Referring Provider: Darreld Mclean    Encounter Date: 11/01/2017  PT End of Session - 11/01/17 1434    Visit Number  1    Number of Visits  8    Date for PT Re-Evaluation  12/01/17    PT Start Time  0945    PT Stop Time  1030    PT Time Calculation (min)  45 min    Activity Tolerance  Patient tolerated treatment well       Past Medical History:  Diagnosis Date  . Arthritis   . Asthma   . Bipolar 1 disorder (HCC)   . COPD (chronic obstructive pulmonary disease) (HCC)   . COPD (chronic obstructive pulmonary disease) (HCC)   . Heart attack (HCC)   . Manic depression (HCC)   . Panic attacks     Past Surgical History:  Procedure Laterality Date  . CESAREAN SECTION    . cyst removed from wrist    . TONSILLECTOMY    . TUBAL LIGATION      There were no vitals filed for this visit.   Subjective Assessment - 11/01/17 0959    Subjective  Nichole [name] states that she walks over 50,000 steps at work and lifts on a constant basis at work.  She began having pain going down her right leg on June 5th.  The pain continued to the point where she is having to use a cane to walk.  She has tried to work it out doing exercises without success.  She went to her orthopedic MD who has referred her to skilled physical therapy.  Currently her pain is intermittent with increased sx with waliking and lifting.  The pain currently will go down to her foot.     Pertinent History  OA,     Limitations  Lifting;Standing;House hold activities;Walking;Sitting    How long can you sit comfortably?  30 minutes     How long can you stand comfortably?  has not tried but thinks that she can stand for about an hour.     How long can you walk comfortably?  Able to walk for  15 minutes at a time.  Walking with a cane at this time.     Patient Stated Goals  less pain, get back to work, walk without an assistive device.     Currently in Pain?  No/denies when it hits pain will go as high as a 5/10         [facility] PT Assessment - 11/01/17 0001      Assessment   Medical Diagnosis  Rt radiculopathy    Referring Provider  Darreld Mclean     Onset Date/Surgical Date  10/05/17    Next MD Visit  11/08/2017    Prior Therapy  in the past for back issues       Precautions   Precautions  None      Restrictions   Weight Bearing Restrictions  No      Balance Screen   Has the patient fallen in the past 6 months  No    Has the patient had a decrease in activity level because of a fear of falling?   Yes    Is the patient reluctant to leave their home because of  a fear of falling?   No      Prior Function   Level of Independence  Independent    Vocation  Full time employment    Vocation Requirements  walking, lifting     Leisure  reading       Cognition   Overall Cognitive Status  Within Functional Limits for tasks assessed      Observation/Other Assessments   Focus on Therapeutic Outcomes (FOTO)   53      Functional Tests   Functional tests  Single leg stance;Sit to Stand      Sit to Stand   Comments  5 x 14.71  (Pended)       ROM / Strength   AROM / PROM / Strength  AROM;Strength      AROM   AROM Assessment Site  Lumbar    Lumbar Flexion  fingers 10" from floor.     Lumbar Extension  15 reps no change       Strength   Strength Assessment Site  Hip;Knee;Ankle    Right/Left Hip  Right;Left    Right Hip Flexion  5/5    Right Hip Extension  5/5    Right Hip ABduction  5/5    Left Hip Flexion  5/5    Left Hip Extension  5/5    Left Hip ABduction  5/5    Right/Left Knee  Right;Left    Right Knee Flexion  5/5    Right Knee Extension  4/5    Left Knee Flexion  5/5    Left Knee Extension  5/5    Right/Left Ankle  Right;Left    Right Ankle Dorsiflexion   4+/5    Left Ankle Dorsiflexion  5/5      Flexibility   Soft Tissue Assessment /Muscle Length  yes    Hamstrings  RT: 165; LT 170                Objective measurements completed on examination: See above findings.      [facility] Adult PT Treatment/Exercise - 11/01/17 0001      Exercises   Exercises  Lumbar      Lumbar Exercises: Stretches   Active Hamstring Stretch  Right;Left;1 rep;30 seconds    Standing Extension  5 reps    Figure 4 Stretch  2 reps;30 seconds      Lumbar Exercises: Standing   Heel Raises  10 reps    Functional Squats  10 reps      Lumbar Exercises: Seated   Sit to Stand  10 reps             PT Education - 11/01/17 1434    Education Details  HEP    Person(s) Educated  Patient    Methods  Explanation    Comprehension  Verbalized understanding       PT Short Term Goals - 11/01/17 1442      PT SHORT TERM GOAL #1   Title  Pt to be able to demonstrate good body mechanics with bed mobility as well as lifting.     Time  2    Period  Weeks    Status  New    Target Date  11/15/17      PT SHORT TERM GOAL #2   Title  PT to states that she has not had any radicular sx past her knee to demonstrate decreased nerve irritation.     Time  2    Period  Weeks    Status  New      PT SHORT TERM GOAL #3   Title  Pt pain level to be no grater than a 5/10 to allow pt to walk without a cane     Time  2    Period  Weeks    Status  New        PT Long Term Goals - 11/01/17 1444      PT LONG TERM GOAL #1   Title  Pt to back pain to be no greater than a 2/10 and to state that she is not experiencing any radicular sx to be able to walk for 40 minutes to complete shopping tasks.     Time  4    Period  Weeks    Status  New    Target Date  11/29/17      PT LONG TERM GOAL #2   Title  Pt to RTW    Time  4    Period  Weeks    Status  New             Plan - 11/01/17 1435    Clinical Impression Statement  Nichole [name] is a 47 yo female who  has been experiencing Rt sciatica since 10/05/2017.  She states at this time the pain is better due to the fact that she started exercising on her own.  The pt still does not feel like her legs will hold her at times and therefore uses a cane when she ambulates.  She would like to RTW where she has to be able to walk and lift all day long.  She has been referred to skilled physical therapy.  Evaluation demonstrates decreased knowledge of body mechanics, decreased activity tolerance, decreased ROM, decreased LE strength, increased pain and difficulty in walking.  Nichole [name] will benefit from skilled physical therapy to address theses issues.     Clinical Presentation  Stable    Clinical Decision Making  Low    Rehab Potential  Good    PT Frequency  2x / week    PT Duration  4 weeks    PT Treatment/Interventions  ADLs/Self Care Home Management;Therapeutic exercise;Patient/family education;Manual techniques    PT Next Visit Plan  give pt information on smoking cessation, begin high level stabilization, educate pt in body mechanics for bed mobility and lifting.     PT Home Exercise Plan  ab set, sit to stand, functional squat, quadriped piriformis and hamstring stretch.     Consulted and Agree with Plan of Care  Patient       Patient will benefit from skilled therapeutic intervention in order to improve the following deficits and impairments:  Decreased activity tolerance, Decreased strength, Difficulty walking, Pain, Improper body mechanics  Visit Diagnosis: Radiculopathy, lumbar region - Plan: PT plan of care cert/re-cert     Problem List Patient Active Problem List   Diagnosis Date Noted  . Chest pain 04/03/2013  . Acute chest pain 04/03/2013  . Sciatica 07/02/2009  . BACK PAIN, CHRONIC 07/02/2009  Virgina Organynthia Russell, PT CLT [phone] 11/01/2017, 2:49 PM  [facility] [address] [place], [place], [address] Phone: [phone]   Fax:   [phone]  Name: Nichole [name] MRN: [id number] Date of Birth: 05/25/1970

## 2017-11-04 ENCOUNTER — Encounter (HOSPITAL_COMMUNITY): Payer: Self-pay | Admitting: Physical Therapy

## 2017-11-04 ENCOUNTER — Ambulatory Visit (HOSPITAL_COMMUNITY): Payer: BLUE CROSS/BLUE SHIELD | Admitting: Physical Therapy

## 2017-11-04 DIAGNOSIS — M5416 Radiculopathy, lumbar region: Secondary | ICD-10-CM

## 2017-11-04 NOTE — Therapy (Signed)
[facility] [address] [place], [place], [address] Phone: [phone]   Fax:  [phone]  Physical Therapy Treatment  Patient Details  Name: Nichole [name] MRN: [id number] Date of Birth: 04/18/1971 Referring Provider: Darreld McleanWayne Keeling    Encounter Date: 11/04/2017  PT End of Session - 11/04/17 0841    Visit Number  2    Number of Visits  8    Date for PT Re-Evaluation  12/01/17    PT Start Time  0815    PT Stop Time  0855    PT Time Calculation (min)  40 min    Activity Tolerance  Patient tolerated treatment well       Past Medical History:  Diagnosis Date  . Arthritis   . Asthma   . Bipolar 1 disorder (HCC)   . COPD (chronic obstructive pulmonary disease) (HCC)   . COPD (chronic obstructive pulmonary disease) (HCC)   . Heart attack (HCC)   . Manic depression (HCC)   . Panic attacks     Past Surgical History:  Procedure Laterality Date  . CESAREAN SECTION    . cyst removed from wrist    . TONSILLECTOMY    . TUBAL LIGATION      There were no vitals filed for this visit.  Subjective Assessment - 11/04/17 0817    Subjective  Pt states that she thinks she over did the HEP and she is sore.      Pertinent History  OA,     Limitations  Lifting;Standing;House hold activities;Walking;Sitting    How long can you sit comfortably?  30 minutes     How long can you stand comfortably?  has not tried but thinks that she can stand for about an hour.     How long can you walk comfortably?  Able to walk for 15 minutes at a time.  Walking with a cane at this time.     Patient Stated Goals  less pain, get back to work, walk without an assistive device.     Currently in Pain?  Yes    Pain Score  3     Pain Location  Back    Pain Orientation  Right    Pain Descriptors / Indicators  Radiating    Pain Type  Chronic pain    Pain Radiating Towards  to knee level    Pain Onset  More than a month ago    Pain Frequency  Intermittent                        [facility] Adult PT Treatment/Exercise - 11/04/17 0001      Ambulation/Gait   Ambulation Distance (Feet)  456 Feet    Assistive device  None      Exercises   Exercises  Lumbar      Lumbar Exercises: Stretches   Active Hamstring Stretch  Right;Left;1 rep;30 seconds    Figure 4 Stretch  2 reps;30 seconds      Lumbar Exercises: Standing   Heel Raises  10 reps    Functional Squats  10 reps    Forward Lunge  10 reps    Other Standing Lumbar Exercises  wall push up x 10      Lumbar Exercises: Seated   Sit to Stand  10 reps      Lumbar Exercises: Supine   Ab Set  --    Bridge  10 reps  PT Education - 11/04/17 0840    Education Details  new exercises as well as reviewing proper body mechanics        PT Short Term Goals - 11/04/17 0848      PT SHORT TERM GOAL #1   Title  Pt to be able to demonstrate good body mechanics with bed mobility as well as lifting.     Time  2    Period  Weeks    Status  Achieved      PT SHORT TERM GOAL #2   Title  PT to states that she has not had any radicular sx past her knee to demonstrate decreased nerve irritation.     Time  2    Period  Weeks    Status  On-going      PT SHORT TERM GOAL #3   Title  Pt pain level to be no grater than a 5/10 to allow pt to walk without a cane     Time  2    Period  Weeks    Status  On-going        PT Long Term Goals - 11/04/17 0848      PT LONG TERM GOAL #1   Title  Pt to back pain to be no greater than a 2/10 and to state that she is not experiencing any radicular sx to be able to walk for 40 minutes to complete shopping tasks.     Time  4    Period  Weeks    Status  On-going      PT LONG TERM GOAL #2   Title  Pt to RTW    Time  4    Period  Weeks    Status  On-going            Plan - 11/04/17 0981    Clinical Impression Statement  Reviewed evaluation and goals.  Added lunging , wall push-ups, bridges and reviewed proper body mechanics.   Pt is able to demonstrate proper technique of exercises with verbal cuing.     Rehab Potential  Good    PT Frequency  2x / week    PT Duration  4 weeks    PT Treatment/Interventions  ADLs/Self Care Home Management;Therapeutic exercise;Patient/family education;Manual techniques    PT Next Visit Plan  continue  high level stabilization including prone leg raise; opposite arm/leg raise.     PT Home Exercise Plan  ab set, sit to stand, functional squat, quadriped piriformis and hamstring stretch.     Consulted and Agree with Plan of Care  Patient       Patient will benefit from skilled therapeutic intervention in order to improve the following deficits and impairments:  Decreased activity tolerance, Decreased strength, Difficulty walking, Pain, Improper body mechanics  Visit Diagnosis: Radiculopathy, lumbar region     Problem List Patient Active Problem List   Diagnosis Date Noted  . Chest pain 04/03/2013  . Acute chest pain 04/03/2013  . Sciatica 07/02/2009  . BACK PAIN, CHRONIC 07/02/2009   Virgina Organ, PT CLT [phone] 11/04/2017, 8:58 AM  [facility] [address] [place], [place], [address] Phone: [phone]   Fax:  [phone]  Name: Nichole [name] MRN: [id number] Date of Birth: [birth year]

## 2017-11-08 ENCOUNTER — Ambulatory Visit (INDEPENDENT_AMBULATORY_CARE_PROVIDER_SITE_OTHER): Payer: BLUE CROSS/BLUE SHIELD | Admitting: Orthopaedic Surgery

## 2017-11-08 ENCOUNTER — Encounter: Payer: Self-pay | Admitting: Orthopaedic Surgery

## 2017-11-08 ENCOUNTER — Ambulatory Visit (HOSPITAL_COMMUNITY): Payer: BLUE CROSS/BLUE SHIELD

## 2017-11-08 VITALS — BP 144/101 | HR 120 | Temp 97.9°F | Ht 68.0 in | Wt 165.0 lb

## 2017-11-08 DIAGNOSIS — M5416 Radiculopathy, lumbar region: Secondary | ICD-10-CM | POA: Diagnosis not present

## 2017-11-08 DIAGNOSIS — M5441 Lumbago with sciatica, right side: Secondary | ICD-10-CM

## 2017-11-08 DIAGNOSIS — G8929 Other chronic pain: Secondary | ICD-10-CM | POA: Diagnosis not present

## 2017-11-08 MED ORDER — CYCLOBENZAPRINE HCL 5 MG PO TABS: 5.0000 mg | ORAL_TABLET | Freq: Three times a day (TID) | ORAL | 1 refills | Status: DC | PRN

## 2017-11-08 MED ORDER — HYDROCODONE-ACETAMINOPHEN 5-325 MG PO TABS: ORAL_TABLET | ORAL | 0 refills | Status: DC

## 2017-11-08 NOTE — Patient Instructions (Signed)
OUT OF WORK ?

## 2017-11-08 NOTE — Therapy (Signed)
[facility] [address] [place], [place], [address] Phone: [phone]   Fax:  [phone]  Physical Therapy Treatment  Patient Details  Name: Nichole [name] MRN: [id number] Date of Birth: Aug 28, 1970 Referring Provider: Darreld Mclean    Encounter Date: 11/08/2017  PT End of Session - 11/08/17 1528    Visit Number  3    Number of Visits  8    Date for PT Re-Evaluation  12/01/17    Authorization Type  [employer] PPO     PT Start Time  1520    PT Stop Time  1600    PT Time Calculation (min)  40 min    Activity Tolerance  Patient tolerated treatment well    Behavior During Therapy  [facility] for tasks assessed/performed       Past Medical History:  Diagnosis Date  . Arthritis   . Asthma   . Bipolar 1 disorder (HCC)   . COPD (chronic obstructive pulmonary disease) (HCC)   . COPD (chronic obstructive pulmonary disease) (HCC)   . Heart attack (HCC)   . Manic depression (HCC)   . Panic attacks     Past Surgical History:  Procedure Laterality Date  . CESAREAN SECTION    . cyst removed from wrist    . TONSILLECTOMY    . TUBAL LIGATION      There were no vitals filed for this visit.  Subjective Assessment - 11/08/17 1523    Subjective  Pt reports she continues to overdo her exercises and mobility training. Dr.  Hilda Lias is planning on patient staying out for 2 more weeks. Pt had increased DBP and HR at ortho appointment this morning.     Currently in Pain?  Yes    Pain Score  4  Right lateral abdomen and right leg anterior and posterior.     Pain Location  -- sharp throbbing paimn                       [facility] Adult PT Treatment/Exercise - 11/08/17 0001      Ambulation/Gait   Ambulation Distance (Feet)  225 Feet    Assistive device  None    Gait velocity  0.9m/s      Lumbar Exercises: Stretches   Active Hamstring Stretch  Right;Left;1 rep;30 seconds    Figure 4 Stretch  --      Lumbar Exercises: Standing   Heel  Raises  15 reps 2x15    Functional Squats  --      Lumbar Exercises: Seated   Long Arc Quad on Chair  2 sets;10 reps;Both    Sit to Stand  10 reps      Lumbar Exercises: Supine   Bent Knee Raise  10 reps bilat in unison; 2x10    Bridge  15 reps    Other Supine Lumbar Exercises  DKTC: 2x30sec Unison bent knee raise 2x10       Lumbar Exercises: Prone   Single Arm Raise  10 reps;Left;Right    Straight Leg Raise  10 reps B               PT Short Term Goals - 11/04/17 0848      PT SHORT TERM GOAL #1   Title  Pt to be able to demonstrate good body mechanics with bed mobility as well as lifting.     Time  2    Period  Weeks    Status  Achieved      PT SHORT TERM GOAL #2   Title  PT to states that she has not had any radicular sx past her knee to demonstrate decreased nerve irritation.     Time  2    Period  Weeks    Status  On-going      PT SHORT TERM GOAL #3   Title  Pt pain level to be no grater than a 5/10 to allow pt to walk without a cane     Time  2    Period  Weeks    Status  On-going        PT Long Term Goals - 11/04/17 0848      PT LONG TERM GOAL #1   Title  Pt to back pain to be no greater than a 2/10 and to state that she is not experiencing any radicular sx to be able to walk for 40 minutes to complete shopping tasks.     Time  4    Period  Weeks    Status  On-going      PT LONG TERM GOAL #2   Title  Pt to RTW    Time  4    Period  Weeks    Status  On-going            Plan - 11/08/17 1530    Clinical Impression Statement  Pt saw ortho this AM and is keeping pt out of work for two more week.s Contiued with current stabilization program. Pt is acutely aggravated d/t too much recent activity and is aware of need to self regulate activity and follow instructions more closely at home with HEP.  3 limb BP established d/t complain of RLE throbbing pain but no significant diffence in SBP or DBP from upper to lower extremity. Exercises progressed in  reps this session as pt strength has improved.     Rehab Potential  Good    PT Frequency  2x / week    PT Duration  4 weeks    PT Treatment/Interventions  ADLs/Self Care Home Management;Therapeutic exercise;Patient/family education;Manual techniques    PT Next Visit Plan  continue  high level stabilization, review prone leg raise; opposite arm/leg raise from last session     PT Home Exercise Plan  ab set, sit to stand, functional squat, quadriped piriformis and hamstring stretch.     Consulted and Agree with Plan of Care  Patient       Patient will benefit from skilled therapeutic intervention in order to improve the following deficits and impairments:  Decreased activity tolerance, Decreased strength, Difficulty walking, Pain, Improper body mechanics  Visit Diagnosis: Radiculopathy, lumbar region     Problem List Patient Active Problem List   Diagnosis Date Noted  . Chest pain 04/03/2013  . Acute chest pain 04/03/2013  . Sciatica 07/02/2009  . BACK PAIN, CHRONIC 07/02/2009   4:00 PM, 11/08/17 [name], PT, [name] [facility] Therapist at [facility] [phone] (office)      [name],[name] 11/08/2017, 4:00 PM  [name] [address] [place], [place], [address] Phone: [phone]   Fax:  [phone]  Name: Nichole [name] MRN: [id number] Date of Birth: 04/08/1971

## 2017-11-08 NOTE — Progress Notes (Signed)
Nichole [name], female DOB:Nov 05, 1970, 47 y.o. VWU:[id number]  Chief Complaint  Nichole presents with  . Follow-up    back pain-has been going to PT    HPI  Nichole [name] is a 47 y.o. female who has lower back pain and right sided sciatica.  She has been going to PT and it has helped.  She has spasm and is out of her medicine.  I will refill it.  She has no new trauma.   Body mass index is 25.09 kg/m.  ROS  Review of Systems  Constitutional: Positive for activity change.  Respiratory: Positive for shortness of breath. Negative for cough.   Endocrine: Positive for cold intolerance.  Musculoskeletal: Positive for arthralgias, back pain, gait problem and myalgias.  Allergic/Immunologic: Positive for environmental allergies.  All other systems reviewed and are negative.   All other systems reviewed and are negative.  Past Medical History:  Diagnosis Date  . Arthritis   . Asthma   . Bipolar 1 disorder (HCC)   . COPD (chronic obstructive pulmonary disease) (HCC)   . COPD (chronic obstructive pulmonary disease) (HCC)   . Heart attack (HCC)   . Manic depression (HCC)   . Panic attacks     Past Surgical History:  Procedure Laterality Date  . CESAREAN SECTION    . cyst removed from wrist    . TONSILLECTOMY    . TUBAL LIGATION      Family History  Problem Relation Age of Onset  . Heart disease Mother   . Mental illness Mother   . Hypertension Mother   . Lung disease Father   . Heart disease Father   . Cancer Brother     Social History Social History   Tobacco Use  . Smoking status: Current Every Day Smoker    Packs/day: 1.00    Types: Cigarettes  . Smokeless tobacco: Never Used  Substance Use Topics  . Alcohol use: Yes    Comment: occ  . Drug use: No    Allergies  Allergen Reactions  . Bee Venom Anaphylaxis and Hives  . Percocet [Oxycodone-Acetaminophen]     headache    Current Outpatient Medications  Medication Sig Dispense Refill  .  albuterol (PROVENTIL HFA;VENTOLIN HFA) 108 (90 BASE) MCG/ACT inhaler Inhale 2 puffs into the lungs every 6 (six) hours as needed for wheezing or shortness of breath.    . Aspirin-Salicylamide-Caffeine (BC HEADACHE POWDER PO) Take 1 packet by mouth 4 (four) times daily as needed (headache).    Marland Kitchen atenolol (TENORMIN) 25 MG tablet Take 25 mg by mouth daily.    . cyclobenzaprine (FLEXERIL) 5 MG tablet Take 1 tablet (5 mg total) by mouth 3 (three) times daily as needed for muscle spasms. 30 tablet 1  . EPINEPHrine (EPIPEN) 0.3 mg/0.3 mL IJ SOAJ injection Inject 0.3 mLs (0.3 mg total) into the muscle once. 1 Device 0  . famotidine (PEPCID) 20 MG tablet Take 1 tablet (20 mg total) by mouth 2 (two) times daily. 30 tablet 0  . HYDROcodone-acetaminophen (NORCO/VICODIN) 5-325 MG tablet One tablet every four hours as needed for acute pain. 30 tablet 0  . ibuprofen (ADVIL,MOTRIN) 600 MG tablet Take 1 tablet (600 mg total) by mouth every 6 (six) hours as needed. 40 tablet 2  . predniSONE (STERAPRED UNI-PAK 21 TAB) 5 MG (21) TBPK tablet Take 6 pills first day; 5 pills second day; 4 pills third day; 3 pills fourth day; 2 pills next day and 1 pill last day. (  Nichole not taking: Reported on 11/08/2017) 21 tablet 0   No current facility-administered medications for this visit.      Physical Exam  Blood pressure (!) 144/101, pulse (!) 120, temperature 97.9 F (36.6 C), height 5\' 8"  (1.727 m), weight 165 lb (74.8 kg).  Constitutional: overall normal hygiene, normal nutrition, well developed, normal grooming, normal body habitus. Assistive device:cane  Musculoskeletal: gait and station Limp right, muscle tone and strength are normal, no tremors or atrophy is present.  .  Neurological: coordination overall normal.  Deep tendon reflex/nerve stretch intact.  Sensation normal.  Cranial nerves II-XII intact.   Skin:   Normal overall no scars, lesions, ulcers or rashes. No psoriasis.  Psychiatric: Alert and oriented x  3.  Recent memory intact, remote memory unclear.  Normal mood and affect. Well groomed.  Good eye contact.  Cardiovascular: overall no swelling, no varicosities, no edema bilaterally, normal temperatures of the legs and arms, no clubbing, cyanosis and good capillary refill.  Lymphatic: palpation is normal.  Spine/Pelvis examination:  Inspection:  Overall, sacoiliac joint benign and hips nontender; without crepitus or defects.   Thoracic spine inspection: Alignment normal without kyphosis present   Lumbar spine inspection:  Alignment  with normal lumbar lordosis, without scoliosis apparent.   Thoracic spine palpation:  without tenderness of spinal processes   Lumbar spine palpation: without tenderness of lumbar area; without tightness of lumbar muscles    Range of Motion:   Lumbar flexion, forward flexion is normal without pain or tenderness    Lumbar extension is full without pain or tenderness   Left lateral bend is normal without pain or tenderness   Right lateral bend is normal without pain or tenderness   Straight leg raising is normal  Strength & tone: normal   Stability overall normal stability All other systems reviewed and are negative   The Nichole has been educated about the nature of the problem(s) and counseled on treatment options.  The Nichole appeared to understand what I have discussed and is in agreement with it.  Encounter Diagnosis  Name Primary?  . Chronic right-sided low back pain with right-sided sciatica Yes    PLAN Call if any problems.  Precautions discussed.  Continue current medications.   Return to clinic 2 weeks   Continue PT.  I have reviewed the [place] Controlled Substance Reporting System web site prior to prescribing narcotic medicine for this Nichole.  Electronically Signed Darreld McleanWayne Etter Royall, MD 7/9/201910:05 AM

## 2017-11-10 ENCOUNTER — Ambulatory Visit (HOSPITAL_COMMUNITY): Payer: BLUE CROSS/BLUE SHIELD

## 2017-11-10 ENCOUNTER — Telehealth (HOSPITAL_COMMUNITY): Payer: Self-pay

## 2017-11-10 DIAGNOSIS — M5416 Radiculopathy, lumbar region: Secondary | ICD-10-CM | POA: Diagnosis not present

## 2017-11-10 NOTE — Therapy (Signed)
[facility] [address] , [place], [address] Phone: [phone]   Fax:  [phone]  Physical Therapy Treatment  Patient Details  Name: Collene GobbleSherry B Sheahan MRN: [id number] Date of Birth: 11/27/1970 Referring Provider: Darreld McleanWayne Keeling    Encounter Date: 11/10/2017  PT End of Session - 11/10/17 1540    Visit Number  4    Number of Visits  8    Date for PT Re-Evaluation  12/01/17    Authorization Type  BCBS PPO     PT Start Time  1519    PT Stop Time  1559    PT Time Calculation (min)  40 min    Activity Tolerance  Patient tolerated treatment well;Patient limited by fatigue;No increased pain    Behavior During Therapy  WFL for tasks assessed/performed       Past Medical History:  Diagnosis Date  . Arthritis   . Asthma   . Bipolar 1 disorder (HCC)   . COPD (chronic obstructive pulmonary disease) (HCC)   . COPD (chronic obstructive pulmonary disease) (HCC)   . Heart attack (HCC)   . Manic depression (HCC)   . Panic attacks     Past Surgical History:  Procedure Laterality Date  . CESAREAN SECTION    . cyst removed from wrist    . TONSILLECTOMY    . TUBAL LIGATION      There were no vitals filed for this visit.  Subjective Assessment - 11/10/17 1521    Subjective  Pt reports seh is doing good today. She feels some spasm in her back and right hip, but mostly comfortable.     Currently in Pain?  No/denies just some spasm and soreness     Pain Location  -- right flank and right lateral pelvis            [facility] Adult PT Treatment/Exercise - 11/10/17 0001      Ambulation/Gait   Gait velocity  0.2845m/s  upon entry c SPC      Lumbar Exercises: Stretches   Active Hamstring Stretch  2 reps;Left;Right;30 seconds    Lower Trunk Rotation  3 reps;30 seconds seated chair rotation stretch    Figure 4 Stretch  2 reps;30 seconds seated FABER stretch in chair c hip hinge (extended lumbar)    Figure 4 Stretch Limitations  targets  tight areas in Rt lateral pelvis in stretch      Lumbar Exercises: Standing   Other Standing Lumbar Exercises  Lateral side stepping with Green TB: 2x5325ft bilat      Lumbar Exercises: Seated   Sit to Stand  10 reps 2x10       Lumbar Exercises: Supine   Bent Knee Raise  10 reps bilat in unison; 2x10    Bridge with clamshell  20 reps 2x10 with band ABDCT, GreenTB       Manual Therapy   Manual Therapy  Myofascial release    Myofascial Release  Rt Posterior glute medius, Rt lateral glute medius, Rt anterior glute minimus ~8 minutes pain increases as high as 9,but typically better in 30sec             PT Education - 11/10/17 1540    Education Details  New HEP additions    Person(s) Educated  Patient    Methods  Explanation    Comprehension  Verbalized understanding       PT Short Term Goals - 11/04/17 0848      PT SHORT TERM  GOAL #1   Title  Pt to be able to demonstrate good body mechanics with bed mobility as well as lifting.     Time  2    Period  Weeks    Status  Achieved      PT SHORT TERM GOAL #2   Title  PT to states that she has not had any radicular sx past her knee to demonstrate decreased nerve irritation.     Time  2    Period  Weeks    Status  On-going      PT SHORT TERM GOAL #3   Title  Pt pain level to be no grater than a 5/10 to allow pt to walk without a cane     Time  2    Period  Weeks    Status  On-going        PT Long Term Goals - 11/04/17 0848      PT LONG TERM GOAL #1   Title  Pt to back pain to be no greater than a 2/10 and to state that she is not experiencing any radicular sx to be able to walk for 40 minutes to complete shopping tasks.     Time  4    Period  Weeks    Status  On-going      PT LONG TERM GOAL #2   Title  Pt to RTW    Time  4    Period  Weeks    Status  On-going            Plan - 11/10/17 1541    Clinical Impression Statement  Pt moving well this date, pain better controlled. New stretching routine is  intergrated into program with good tolerance and good targeting of spastic areas in back and hip. Pt is agreeable to add these to her home program. Pt progressing well overall. Also added in some Myofascial release for diagnostic reasons with positive reproduction of symptomatic pain with glute med deep palpation and postiive reproduction of symptomatic Rt anterolateral referral with deep palpation of anterior portion of gluteminimus: these are both well establisehd referral patterns in the literature. Pt progressign well toward goals overall.      Rehab Potential  Good    PT Frequency  2x / week    PT Duration  4 weeks    PT Treatment/Interventions  ADLs/Self Care Home Management;Therapeutic exercise;Patient/family education;Manual techniques    PT Next Visit Plan  continue  with core strengthing/stabilization; review new HEP updates, repeated MFR to posterio and lateral glute medius and anterio glute minimus.     PT Home Exercise Plan  ab set, sit to stand, functional squat, quadriped piriformis and hamstring stretch.        Patient will benefit from skilled therapeutic intervention in order to improve the following deficits and impairments:  Decreased activity tolerance, Decreased strength, Difficulty walking, Pain, Improper body mechanics  Visit Diagnosis: Radiculopathy, lumbar region     Problem List Patient Active Problem List   Diagnosis Date Noted  . Chest pain 04/03/2013  . Acute chest pain 04/03/2013  . Sciatica 07/02/2009  . BACK PAIN, CHRONIC 07/02/2009   4:00 PM, 11/10/17 Rosamaria Lints, PT, [name] Physical Therapist at [facility] [phone] (office)      Rosamaria Lints 11/10/2017, 4:00 PM  [facility] [address] [place], [place], [address] Phone: [phone]   Fax:  [phone]  Name: Marisol Giambra  [place] MRN: [id number] Date of Birth: 01-06-1971

## 2017-11-10 NOTE — Telephone Encounter (Deleted)
PT returning patients's call. Pt called office today shortly after leaving PT evaluation. Pt reports continued dizziness, HA, and neck pain since performing repeated flexion in standing at end of evaluation. Patient asking if this is normal or anticipated. PT reports that these symptoms are not atypically for 30-45 seconds after described activity, but not normal for persistence as described by patient. Pt reports these symptoms as outside of the scope of her typical symptomology. She denies any suspicious of dehydration. She denies and spinning sensations or vertiginious phenomenon. She denies symptom response to positional changes. Pt encouraged to contact PCP (Dr. Lodema HongSimpson) shoulder symptoms worsen or become concerningly severe. Pt encouraged to cease Flexion stretches on HEP until next PT visit.   4:13 PM, 11/10/17 Rosamaria LintsAllan C Buccola, PT, [name] Physical Therapist at [facility] [phone] (office)

## 2017-11-15 ENCOUNTER — Ambulatory Visit (HOSPITAL_COMMUNITY): Payer: BLUE CROSS/BLUE SHIELD | Admitting: Physical Therapy

## 2017-11-15 DIAGNOSIS — M5416 Radiculopathy, lumbar region: Secondary | ICD-10-CM

## 2017-11-15 NOTE — Therapy (Signed)
[facility] [address] [place], [place], [address] Phone: [phone]   Fax:  [phone]  Physical Therapy Treatment  Patient Details  Name: Nichole [name] MRN: [id number] Date of Birth: 05/19/1970 Referring Provider: Darreld McleanWayne Keeling    Encounter Date: 11/15/2017  PT End of Session - 11/15/17 1609    Visit Number  5    Number of Visits  8    Date for PT Re-Evaluation  12/01/17    Authorization Type  BCBS PPO     PT Start Time  1517    PT Stop Time  1555    PT Time Calculation (min)  38 min    Activity Tolerance  Patient tolerated treatment well;Patient limited by fatigue;No increased pain    Behavior During Therapy  WFL for tasks assessed/performed       Past Medical History:  Diagnosis Date  . Arthritis   . Asthma   . Bipolar 1 disorder (HCC)   . COPD (chronic obstructive pulmonary disease) (HCC)   . COPD (chronic obstructive pulmonary disease) (HCC)   . Heart attack (HCC)   . Manic depression (HCC)   . Panic attacks     Past Surgical History:  Procedure Laterality Date  . CESAREAN SECTION    . cyst removed from wrist    . TONSILLECTOMY    . TUBAL LIGATION      There were no vitals filed for this visit.  Subjective Assessment - 11/15/17 1520    Subjective  first day of walking without the cane today.  States her arthritis pain is up today and thinks its from the heat (88 degrees). States the air also went out in her trailor.   Reports 3/10 back pain, Rt side.    Currently in Pain?  Yes    Pain Score  3     Pain Location  Back    Pain Orientation  Right    Pain Descriptors / Indicators  Radiating    Pain Radiating Towards  to Rt knee                        [facility] Adult PT Treatment/Exercise - 11/15/17 0001      Lumbar Exercises: Standing   Heel Raises  15 reps    Forward Lunge  15 reps    Other Standing Lumbar Exercises  lifting, body mechanics 8# box, lateral transitioning    Other Standing  Lumbar Exercises  Lateral side stepping with Green TB: 2RT      Lumbar Exercises: Seated   Sit to Stand  10 reps      Lumbar Exercises: Supine   Bent Knee Raise  15 reps    Bridge  15 reps      Lumbar Exercises: Prone   Single Arm Raise  10 reps;Left;Right    Straight Leg Raise  10 reps      Manual Therapy   Manual Therapy  Myofascial release    Manual therapy comments  completed seperately from all other skilled interventions    Myofascial Release  prone lying, Rt glute and lumbar paraspinals               PT Short Term Goals - 11/04/17 0848      PT SHORT TERM GOAL #1   Title  Pt to be able to demonstrate good body mechanics with bed mobility as well as lifting.     Time  2  Period  Weeks    Status  Achieved      PT SHORT TERM GOAL #2   Title  PT to states that she has not had any radicular sx past her knee to demonstrate decreased nerve irritation.     Time  2    Period  Weeks    Status  On-going      PT SHORT TERM GOAL #3   Title  Pt pain level to be no grater than a 5/10 to allow pt to walk without a cane     Time  2    Period  Weeks    Status  On-going        PT Long Term Goals - 11/04/17 0848      PT LONG TERM GOAL #1   Title  Pt to back pain to be no greater than a 2/10 and to state that she is not experiencing any radicular sx to be able to walk for 40 minutes to complete shopping tasks.     Time  4    Period  Weeks    Status  On-going      PT LONG TERM GOAL #2   Title  Pt to RTW    Time  4    Period  Weeks    Status  On-going            Plan - 11/15/17 1610    Clinical Impression Statement  Pt with improved pain today, noted improved gait quality and not using her SPC today.  General tightness palpated in Rt glute and paraspinals but no spasms located.  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  Pt able to demonstrate correctly without twisting or placing  load on her lumbar spine.      Rehab Potential  Good    PT Frequency  2x / week    PT Duration  4 weeks    PT Treatment/Interventions  ADLs/Self Care Home Management;Therapeutic exercise;Patient/family education;Manual techniques    PT Next Visit Plan  continue  with core strengthing/stabilization.  continue with manual techniques as needed. Next session begin vector stance and standing hip abduction/extension.    PT Home Exercise Plan  ab set, sit to stand, functional squat, quadriped piriformis and hamstring stretch.        Patient will benefit from skilled therapeutic intervention in order to improve the following deficits and impairments:  Decreased activity tolerance, Decreased strength, Difficulty walking, Pain, Improper body mechanics  Visit Diagnosis: Radiculopathy, lumbar region     Problem List Patient Active Problem List   Diagnosis Date Noted  . Chest pain 04/03/2013  . Acute chest pain 04/03/2013  . Sciatica 07/02/2009  . BACK PAIN, CHRONIC 07/02/2009   Nichole [name], PTA/CLT [phone]  Nichole [name] 11/15/2017, 4:20 PM  [facility] [address] [place], [place], [address] Phone: [phone]   Fax:  [phone]  Name: Nichole [name] MRN: [id number] Date of Birth: January 11, 1971

## 2017-11-17 ENCOUNTER — Ambulatory Visit (HOSPITAL_COMMUNITY): Payer: BLUE CROSS/BLUE SHIELD | Admitting: Physical Therapy

## 2017-11-17 DIAGNOSIS — M5416 Radiculopathy, lumbar region: Secondary | ICD-10-CM

## 2017-11-17 NOTE — Therapy (Signed)
[facility] [address] [place], [place], [address] Phone: [phone]   Fax:  [phone]  Physical Therapy Treatment  Patient Details  Name: Nichole [name] MRN: [id number] Date of Birth: 07/31/1970 Referring Provider: Darreld McleanWayne Keeling    Encounter Date: 11/17/2017  PT End of Session - 11/17/17 1539    Visit Number  6    Number of Visits  8    Date for PT Re-Evaluation  12/01/17    Authorization Type  BCBS PPO     PT Start Time  1445    PT Stop Time  1525    PT Time Calculation (min)  40 min    Activity Tolerance  Patient tolerated treatment well;Patient limited by fatigue;No increased pain    Behavior During Therapy  WFL for tasks assessed/performed       Past Medical History:  Diagnosis Date  . Arthritis   . Asthma   . Bipolar 1 disorder (HCC)   . COPD (chronic obstructive pulmonary disease) (HCC)   . COPD (chronic obstructive pulmonary disease) (HCC)   . Heart attack (HCC)   . Manic depression (HCC)   . Panic attacks     Past Surgical History:  Procedure Laterality Date  . CESAREAN SECTION    . cyst removed from wrist    . TONSILLECTOMY    . TUBAL LIGATION      There were no vitals filed for this visit.  Subjective Assessment - 11/17/17 1540    Subjective  Pt reports no pain today other than menstrual cramps.  States she has been compliant with HEP.    Currently in Pain?  No/denies                       [facility] Adult [facility]/Exercise - 11/17/17 0001      Lumbar Exercises: Standing   Heel Raises  15 reps    Forward Lunge  15 reps    Other Standing Lumbar Exercises  vectors 10X5" each, hip abduction 10 reps, hip extension 10 reps    Other Standing Lumbar Exercises  Lateral side stepping with Green TB: 2RT      Lumbar Exercises: Seated   Sit to Stand  10 reps      Lumbar Exercises: Supine   Clam  10 reps    Bent Knee Raise  15 reps    Bridge  15 reps      Lumbar Exercises: Prone   Single Arm Raise  10 reps    Straight Leg Raise  10 reps               PT Short Term Goals - 11/04/17 0848      PT SHORT TERM GOAL #1   Title  Pt to be able to demonstrate good body mechanics with bed mobility as well as lifting.     Time  2    Period  Weeks    Status  Achieved      PT SHORT TERM GOAL #2   Title  PT to states that she has not had any radicular sx past her knee to demonstrate decreased nerve irritation.     Time  2    Period  Weeks    Status  On-going      PT SHORT TERM GOAL #3   Title  Pt pain level to be no grater than a 5/10 to allow pt to walk without a cane  Time  2    Period  Weeks    Status  On-going        PT Long Term Goals - 11/04/17 0848      PT LONG TERM GOAL #1   Title  Pt to back pain to be no greater than a 2/10 and to state that she is not experiencing any radicular sx to be able to walk for 40 minutes to complete shopping tasks.     Time  4    Period  Weeks    Status  On-going      PT LONG TERM GOAL #2   Title  Pt to RTW    Time  4    Period  Weeks    Status  On-going            Plan - 11/17/17 1543    Clinical Impression Statement  Pt without pain today.  Able to complete all established therex with additional proximal LE strengthening exercises added with cues for form.  PT also continues to require cues to complete prone stab exercises in correct form.       Rehab Potential  Good    PT Frequency  2x / week    PT Duration  4 weeks    PT Treatment/Interventions  ADLs/Self Care Home Management;Therapeutic exercise;Patient/family education;Manual techniques    PT Next Visit Plan  continue  with core strengthing/stabilization.  continue with manual techniques as needed.    PT Home Exercise Plan  ab set, sit to stand, functional squat, quadriped piriformis and hamstring stretch.        Patient will benefit from skilled therapeutic intervention in order to improve the following deficits and impairments:  Decreased  activity tolerance, Decreased strength, Difficulty walking, Pain, Improper body mechanics  Visit Diagnosis: Radiculopathy, lumbar region     Problem List Patient Active Problem List   Diagnosis Date Noted  . Chest pain 04/03/2013  . Acute chest pain 04/03/2013  . Sciatica 07/02/2009  . BACK PAIN, CHRONIC 07/02/2009   Lurena Nida, PTA/CLT [phone]  Lurena Nida 11/17/2017, 3:58 PM  [facility] [address] [place], [place], [address] Phone: [phone]   Fax:  [phone]  Name: Nichole [name] MRN: [id number] Date of Birth: 12-17-1970

## 2017-11-22 ENCOUNTER — Encounter (HOSPITAL_COMMUNITY): Payer: Self-pay | Admitting: Physical Therapy

## 2017-11-22 ENCOUNTER — Ambulatory Visit (HOSPITAL_COMMUNITY): Payer: BLUE CROSS/BLUE SHIELD | Admitting: Physical Therapy

## 2017-11-22 ENCOUNTER — Ambulatory Visit: Payer: BLUE CROSS/BLUE SHIELD | Admitting: Orthopaedic Surgery

## 2017-11-22 ENCOUNTER — Other Ambulatory Visit: Payer: Self-pay

## 2017-11-22 DIAGNOSIS — M5416 Radiculopathy, lumbar region: Secondary | ICD-10-CM | POA: Diagnosis not present

## 2017-11-22 NOTE — Therapy (Signed)
[facility] [address] [place], [place], [address] Phone: [phone]   Fax:  [phone]  Physical Therapy Treatment  Patient Details  Name: Nichole [name] MRN: [id number] Date of Birth: 02-Mar-1971 Referring Provider: Darreld Mclean    Encounter Date: 11/22/2017  PT End of Session - 11/22/17 1550    Visit Number  7    Number of Visits  8    Date for PT Re-Evaluation  12/01/17    Authorization Type  BCBS PPO     PT Start Time  1510    PT Stop Time  1553    PT Time Calculation (min)  43 min    Activity Tolerance  Patient tolerated treatment well;Patient limited by fatigue;No increased pain    Behavior During Therapy  WFL for tasks assessed/performed       Past Medical History:  Diagnosis Date  . Arthritis   . Asthma   . Bipolar 1 disorder (HCC)   . COPD (chronic obstructive pulmonary disease) (HCC)   . COPD (chronic obstructive pulmonary disease) (HCC)   . Heart attack (HCC)   . Manic depression (HCC)   . Panic attacks     Past Surgical History:  Procedure Laterality Date  . CESAREAN SECTION    . cyst removed from wrist    . TONSILLECTOMY    . TUBAL LIGATION      There were no vitals filed for this visit.  Subjective Assessment - 11/22/17 1510    Subjective  Pt states that she continues to complete her HEP she is walking without her cane now     Pertinent History  OA,     Limitations  Lifting;Standing;House hold activities;Walking;Sitting    How long can you sit comfortably?  45 minutes     How long can you stand comfortably?  has not tried but thinks that she can stand for about an hour.     How long can you walk comfortably?  Able to walk for 15 minutes at a time.  Walking without her cane now     Patient Stated Goals  less pain, get back to work, walk without an assistive device.     Currently in Pain?  No/denies    Pain Onset  More than a month ago                       [facility] Adult PT  Treatment/Exercise - 11/22/17 0001      Balance Poses: Yoga   Warrior I  2 reps;30 seconds    Warrior II  2 reps;30 seconds      Exercises   Exercises  Lumbar      Lumbar Exercises: Standing   Heel Raises  15 reps    Functional Squats  15 reps    Lifting  From 12";10 reps    Forward Lunge  10 reps    Side Lunge  10 reps    Scapular Retraction  Strengthening;Both;10 reps    Row  Strengthening;10 reps;Theraband    Shoulder Extension  Strengthening;Both;10 reps    Other Standing Lumbar Exercises  3 D hip excursion x 3       Lumbar Exercises: Seated   Sit to Stand  20 reps      Lumbar Exercises: Quadruped   Opposite Arm/Leg Raise  Right arm/Left leg;Left arm/Right leg;10 reps             PT Education - 11/22/17 1549  Education Details  lifting; new HEP     Person(s) Educated  Patient    Methods  Explanation    Comprehension  Verbalized understanding;Returned demonstration       PT Short Term Goals - 11/04/17 0848      PT SHORT TERM GOAL #1   Title  Pt to be able to demonstrate good body mechanics with bed mobility as well as lifting.     Time  2    Period  Weeks    Status  Achieved      PT SHORT TERM GOAL #2   Title  PT to states that she has not had any radicular sx past her knee to demonstrate decreased nerve irritation.     Time  2    Period  Weeks    Status  On-going      PT SHORT TERM GOAL #3   Title  Pt pain level to be no grater than a 5/10 to allow pt to walk without a cane     Time  2    Period  Weeks    Status  On-going        PT Long Term Goals - 11/04/17 0848      PT LONG TERM GOAL #1   Title  Pt to back pain to be no greater than a 2/10 and to state that she is not experiencing any radicular sx to be able to walk for 40 minutes to complete shopping tasks.     Time  4    Period  Weeks    Status  On-going      PT LONG TERM GOAL #2   Title  Pt to RTW    Time  4    Period  Weeks    Status  On-going            Plan - 11/22/17  1550    Clinical Impression Statement  Introduced and instructed  pt to theraband postural exercises, side lunging and Warrior i and II exercises.  Pt HEP updated. Pt returns to MD tomorrow with therapy reassessement on Thursday.  Anticipate discharge from therapy.     Rehab Potential  Good    PT Frequency  2x / week    PT Duration  4 weeks    PT Treatment/Interventions  ADLs/Self Care Home Management;Therapeutic exercise;Patient/family education;Manual techniques    PT Next Visit Plan  Reassess and probable discharge.     PT Home Exercise Plan  ab set, sit to stand, functional squat, quadriped piriformis and hamstring stretch.        Patient will benefit from skilled therapeutic intervention in order to improve the following deficits and impairments:  Decreased activity tolerance, Decreased strength, Difficulty walking, Pain, Improper body mechanics  Visit Diagnosis: Radiculopathy, lumbar region     Problem List Patient Active Problem List   Diagnosis Date Noted  . Chest pain 04/03/2013  . Acute chest pain 04/03/2013  . Sciatica 07/02/2009  . BACK PAIN, CHRONIC 07/02/2009  Virgina Organynthia Analena Gama, PT CLT [phone] 11/22/2017, 3:59 PM  [facility] [address] [place], [place], [address] Phone: [phone]   Fax:  [phone]  Name: Nichole [name] MRN: [id number] Date of Birth: 01/20/1971

## 2017-11-22 NOTE — Patient Instructions (Addendum)
Forward Lunge    Standing with feet shoulder width apart and stomach tight, step forward with left leg. Repeat _10___ times per set. Do __1__ sets per session. Do 2____ sessions per day.  http://orth.exer.us/1146   Copyright  VHI. All rights reserved.  Side Lunge    Stand with knees slightly bent, stomach tight. Step to side with right leg. Repeat 10____ times per set. Do __1__ sets per session. Do ___2_ sessions per day.  http://orth.exer.us/1148   Copyright  VHI. All rights reserved.  Sitting to Standing    With straight back, tighten stomach, place right leg back under chair, lean slightly forward and stand. Repeat __10__ times per set. Do _1___ sets per session. Do 2____ sessions per day.  http://orth.exer.us/1140   Copyright  VHI. All rights reserved.

## 2017-11-23 ENCOUNTER — Encounter: Payer: Self-pay | Admitting: Orthopaedic Surgery

## 2017-11-23 ENCOUNTER — Ambulatory Visit: Payer: BLUE CROSS/BLUE SHIELD | Admitting: Orthopaedic Surgery

## 2017-11-23 VITALS — BP 152/110 | HR 92 | Temp 98.7°F | Ht 67.0 in | Wt 165.0 lb

## 2017-11-23 DIAGNOSIS — M5441 Lumbago with sciatica, right side: Secondary | ICD-10-CM

## 2017-11-23 DIAGNOSIS — G8929 Other chronic pain: Secondary | ICD-10-CM

## 2017-11-23 NOTE — Patient Instructions (Signed)
Return to work Monday 

## 2017-11-23 NOTE — Progress Notes (Signed)
CC:  My back is much better  She has been to PT.  She finishes up this week.  She is having much less pain.  NV intact. Exam normal.  Back is not hurting.  Encounter Diagnosis  Name Primary?  . Chronic right-sided low back pain with right-sided sciatica Yes   Finish up PT.  Return to work Monday.  I will see as needed.  Call if any problem.  Precautions discussed.   Electronically Signed Darreld McleanWayne Evon Lopezperez, MD 7/24/201910:55 AM

## 2017-11-24 ENCOUNTER — Ambulatory Visit (HOSPITAL_COMMUNITY): Payer: BLUE CROSS/BLUE SHIELD | Admitting: Physical Therapy

## 2017-11-24 ENCOUNTER — Encounter (HOSPITAL_COMMUNITY): Payer: Self-pay | Admitting: Physical Therapy

## 2017-11-24 DIAGNOSIS — M5416 Radiculopathy, lumbar region: Secondary | ICD-10-CM | POA: Diagnosis not present

## 2017-11-24 NOTE — Patient Instructions (Signed)
  BODY MECHANICS - OVER HEAD LIFTING Start by standing close to the object with feet spread apart. Bend at the knees and hips and NOT at your spine. Hold the object close to your body as you use your legs muscles to stand back up lifting the object. Walk over to the surface you want to set the object on and raise it up over head with a "one-hand-under and one-hand-over" technique as shown. Set it down and DO NOT extend at the spine. Also, be sure NOT to twist your spine but to pivot your feet so that your feet are pointed forward to where you want to set the object. Slide the object on the shelf to off load your body. 

## 2017-11-24 NOTE — Therapy (Signed)
[facility] [address] [place], [place], [address] Phone: [phone]   Fax:  [phone]  Physical Therapy Treatment / Re-assessment / Discharge Summary  Patient Details  Name: Nichole [name] MRN: [id number] Date of Birth: 05/21/1970 Referring Provider: Sanjuana Kava, MD   Encounter Date: 11/24/2017   Progress Note Reporting Period 11/01/17 to 11/24/17  See note below for Objective Data and Assessment of Progress/Goals.       PT End of Session - 11/24/17 1622    Visit Number  8    Number of Visits  8    Date for PT Re-Evaluation  12/01/17    Authorization Type  BCBS PPO     PT Start Time  4975    PT Stop Time  1613    PT Time Calculation (min)  30 min    Activity Tolerance  Patient tolerated treatment well;Patient limited by fatigue;No increased pain    Behavior During Therapy  WFL for tasks assessed/performed       Past Medical History:  Diagnosis Date  . Arthritis   . Asthma   . Bipolar 1 disorder ([place])   . COPD (chronic obstructive pulmonary disease) ([place])   . COPD (chronic obstructive pulmonary disease) ([place])   . Heart attack ([place])   . Manic depression ([place])   . Panic attacks     Past Surgical History:  Procedure Laterality Date  . CESAREAN SECTION    . cyst removed from wrist    . TONSILLECTOMY    . TUBAL LIGATION      There were no vitals filed for this visit.  Subjective Assessment - 11/24/17 1549    Subjective  Patient stated she has been doing the exercises she got last session. She stated she hasn't had any pain, just a little soreness in her thighs.     Pertinent History  OA,     Limitations  Lifting;Standing;House hold activities;Walking;Sitting    How long can you sit comfortably?  1 hour    How long can you stand comfortably?  Several hours    How long can you walk comfortably?  1 hour without the cane    Patient Stated Goals  less pain, get back to work, walk without an assistive device.      Currently in Pain?  No/denies         [facility] PT Assessment - 11/24/17 0001      Assessment   Medical Diagnosis  Rt radiculopathy    Referring Provider  Sanjuana Kava, MD      Cognition   Overall Cognitive Status  Within Functional Limits for tasks assessed      Observation/Other Assessments   Observations  Lifting box from floor to hip height x 2 reptitions with verbal cues to keep box near body, with big white box.    Focus on Therapeutic Outcomes (FOTO)   94% (6% limited)      Single Leg Stance   Comments  SLS Lt. 25.85 seconds; SLS Rt 25 seconds      Sit to Stand   Comments  5xSTS 9.89 seconds      AROM   Lumbar Flexion  Fingers 6 inches from floor      Strength   Right Hip Flexion  5/5    Right Hip Extension  5/5    Right Hip ABduction  5/5    Left Hip Flexion  5/5    Left Hip Extension  5/5  Left Hip ABduction  5/5    Right Knee Flexion  5/5    Right Knee Extension  5/5    Left Knee Flexion  5/5    Left Knee Extension  5/5    Right Ankle Dorsiflexion  5/5    Left Ankle Dorsiflexion  5/5      Flexibility   Hamstrings  RT: 165; LT 170                             PT Short Term Goals - 11/24/17 1623      PT SHORT TERM GOAL #1   Title  Pt to be able to demonstrate good body mechanics with bed mobility as well as lifting.     Time  2    Period  Weeks    Status  Achieved      PT SHORT TERM GOAL #2   Title  PT to states that she has not had any radicular sx past her knee to demonstrate decreased nerve irritation.     Baseline  11/24/17: Patient denied any pain.     Time  2    Period  Weeks    Status  Achieved      PT SHORT TERM GOAL #3   Title  Pt pain level to be no grater than a 5/10 to allow pt to walk without a cane     Baseline  11/24/17: Patient denied any pain.     Time  2    Period  Weeks    Status  Achieved        PT Long Term Goals - 11/24/17 1624      PT LONG TERM GOAL #1   Title  Pt to back pain to be no greater  than a 2/10 and to state that she is not experiencing any radicular sx to be able to walk for 40 minutes to complete shopping tasks.     Baseline  11/24/17: Patient reported no pain.     Time  4    Period  Weeks    Status  Achieved      PT LONG TERM GOAL #2   Title  Pt to RTW    Baseline  11/24/17: Patient is to return to work next Monday.     Time  4    Period  Weeks    Status  Partially Met            Plan - 11/24/17 1632    Clinical Impression Statement  This session performed a re-assessment of patient's progress towards goals. Patient achieved 3 out of 3 short term goals. Patient achieved 1 out of 2 long term goals. Patient partially met 1 out of 2 long term goals as she plans to return to work next Monday, but has not yet gone. The remainder of the session, therapist reviewed with the patient on proper lifting technique overhead and provided patient with a handout. Also, educated patient on benefits of attending local gym and provided patient information about it. Patient reported being ready for discharge. Patient is being discharged at this time as she has met the majority of her goals and is planning to return to work. Patient was educated on how to contact the clinic with any future questions or concerns.      Rehab Potential  Good    PT Frequency  2x / week    PT Duration  4 weeks  PT Treatment/Interventions  ADLs/Self Care Home Management;Therapeutic exercise;Patient/family education;Manual techniques    PT Next Visit Plan  Patient discharged    PT Home Exercise Plan  ab set, sit to stand, functional squat, quadriped piriformis and hamstring stretch.        Patient will benefit from skilled therapeutic intervention in order to improve the following deficits and impairments:  Decreased activity tolerance, Decreased strength, Difficulty walking, Pain, Improper body mechanics  Visit Diagnosis: Radiculopathy, lumbar region     Problem List Patient Active Problem List    Diagnosis Date Noted  . Chest pain 04/03/2013  . Acute chest pain 04/03/2013  . Sciatica 07/02/2009  . BACK PAIN, CHRONIC 07/02/2009   PHYSICAL THERAPY DISCHARGE SUMMARY  Visits from Start of Care: 8  Current functional level related to goals / functional outcomes: See above   Remaining deficits: See above   Education / Equipment: See above Plan: Patient agrees to discharge.  Patient goals were partially met. Patient is being discharged due to meeting the stated rehab goals.  ?????    Patient has met the majority of her rehab goals and denies pain at this time. The only goal she has not met is returning to work, which she plans to do next Monday.            Clarene Critchley PT, [name] 4:35 PM, 11/24/17 [place], [place], [address] Phone: [phone]   Fax:  [phone]  Name: Nichole [name] MRN: [id number] Date of Birth: January 25, 1971

## 2018-04-05 ENCOUNTER — Encounter: Payer: Self-pay | Admitting: Orthopedic Surgery

## 2018-04-05 ENCOUNTER — Encounter: Payer: Self-pay | Admitting: Orthopaedic Surgery

## 2018-04-05 ENCOUNTER — Ambulatory Visit: Payer: BLUE CROSS/BLUE SHIELD | Admitting: Orthopaedic Surgery

## 2018-04-05 VITALS — BP 151/101 | HR 102 | Ht 67.0 in | Wt 162.0 lb

## 2018-04-05 DIAGNOSIS — F17210 Nicotine dependence, cigarettes, uncomplicated: Secondary | ICD-10-CM

## 2018-04-05 DIAGNOSIS — G8929 Other chronic pain: Secondary | ICD-10-CM

## 2018-04-05 DIAGNOSIS — M5441 Lumbago with sciatica, right side: Secondary | ICD-10-CM

## 2018-04-05 MED ORDER — CYCLOBENZAPRINE HCL 5 MG PO TABS: 5.0000 mg | ORAL_TABLET | Freq: Three times a day (TID) | ORAL | 1 refills | Status: DC | PRN

## 2018-04-05 MED ORDER — HYDROCODONE-ACETAMINOPHEN 5-325 MG PO TABS: ORAL_TABLET | ORAL | 0 refills | Status: DC

## 2018-04-05 NOTE — Patient Instructions (Addendum)
Steps to Quit Smoking Smoking tobacco can be bad for your health. It can also affect almost every organ in your body. Smoking puts you and people around you at risk for many serious long-lasting (chronic) diseases. Quitting smoking is hard, but it is one of the best things that you can do for your health. It is never too late to quit. What are the benefits of quitting smoking? When you quit smoking, you lower your risk for getting serious diseases and conditions. They can include:  Lung cancer or lung disease.  Heart disease.  Stroke.  Heart attack.  Not being able to have children (infertility).  Weak bones (osteoporosis) and broken bones (fractures).  If you have coughing, wheezing, and shortness of breath, those symptoms may get better when you quit. You may also get sick less often. If you are pregnant, quitting smoking can help to lower your chances of having a baby of low birth weight. What can I do to help me quit smoking? Talk with your doctor about what can help you quit smoking. Some things you can do (strategies) include:  Quitting smoking totally, instead of slowly cutting back how much you smoke over a period of time.  Going to in-person counseling. You are more likely to quit if you go to many counseling sessions.  Using resources and support systems, such as: ? Online chats with a counselor. ? Phone quitlines. ? Printed self-help materials. ? Support groups or group counseling. ? Text messaging programs. ? Mobile phone apps or applications.  Taking medicines. Some of these medicines may have nicotine in them. If you are pregnant or breastfeeding, do not take any medicines to quit smoking unless your doctor says it is okay. Talk with your doctor about counseling or other things that can help you.  Talk with your doctor about using more than one strategy at the same time, such as taking medicines while you are also going to in-person counseling. This can help make  quitting easier. What things can I do to make it easier to quit? Quitting smoking might feel very hard at first, but there is a lot that you can do to make it easier. Take these steps:  Talk to your family and friends. Ask them to support and encourage you.  Call phone quitlines, reach out to support groups, or work with a counselor.  Ask people who smoke to not smoke around you.  Avoid places that make you want (trigger) to smoke, such as: ? Bars. ? Parties. ? Smoke-break areas at work.  Spend time with people who do not smoke.  Lower the stress in your life. Stress can make you want to smoke. Try these things to help your stress: ? Getting regular exercise. ? Deep-breathing exercises. ? Yoga. ? Meditating. ? Doing a body scan. To do this, close your eyes, focus on one area of your body at a time from head to toe, and notice which parts of your body are tense. Try to relax the muscles in those areas.  Download or buy apps on your mobile phone or tablet that can help you stick to your quit plan. There are many free apps, such as QuitGuide from the CDC (Centers for Disease Control and Prevention). You can find more support from smokefree.gov and other websites.  This information is not intended to replace advice given to you by your health care provider. Make sure you discuss any questions you have with your health care provider. Document Released: 02/13/2009 Document   Revised: 12/16/2015 Document Reviewed: 09/03/2014 Elsevier Interactive Patient Education  2018 Elsevier Inc.   OUT OF WORK 

## 2018-04-05 NOTE — Progress Notes (Signed)
Patient ON:Nichole [name]:Nichole [name], female DOB:02/22/1971, 47 y.o. UXL:[id number]  Chief Complaint  Patient presents with  . Back Pain    has gotten worse asking ofr work restrictions/ light duty     HPI  [name] is a 47 y.o. female who has acute lower back pain on top of her chronic back pain.  She has been working and moving objects more at work.  She has no radiation of pain. She has no weakness.  She has no falls.  She has run out of her Flexeril.  The cold weather makes this worse.   Body mass index is 25.37 kg/m.  ROS  Review of Systems  Constitutional: Positive for activity change.  Respiratory: Positive for shortness of breath. Negative for cough.   Endocrine: Positive for cold intolerance.  Musculoskeletal: Positive for arthralgias, back pain, gait problem and myalgias.  Allergic/Immunologic: Positive for environmental allergies.  All other systems reviewed and are negative.   All other systems reviewed and are negative.  The following is a summary of the past history medically, past history surgically, known current medicines, social history and family history.  This information is gathered electronically by the computer from prior information and documentation.  I review this each visit and have found including this information at this point in the chart is beneficial and informative.    Past Medical History:  Diagnosis Date  . Arthritis   . Asthma   . Bipolar 1 disorder (HCC)   . COPD (chronic obstructive pulmonary disease) (HCC)   . COPD (chronic obstructive pulmonary disease) (HCC)   . Heart attack (HCC)   . Manic depression (HCC)   . Panic attacks     Past Surgical History:  Procedure Laterality Date  . CESAREAN SECTION    . cyst removed from wrist    . TONSILLECTOMY    . TUBAL LIGATION      Family History  Problem Relation Age of Onset  . Heart disease Mother   . Mental illness Mother   . Hypertension Mother   . Lung disease Father   . Heart  disease Father   . Cancer Brother     Social History Social History   Tobacco Use  . Smoking status: Current Every Day Smoker    Packs/day: 1.00    Types: Cigarettes  . Smokeless tobacco: Never Used  Substance Use Topics  . Alcohol use: Yes    Comment: occ  . Drug use: No    Allergies  Allergen Reactions  . Bee Venom Anaphylaxis and Hives  . Percocet [Oxycodone-Acetaminophen]     headache    Current Outpatient Medications  Medication Sig Dispense Refill  . cyclobenzaprine (FLEXERIL) 5 MG tablet Take 1 tablet (5 mg total) by mouth 3 (three) times daily as needed for muscle spasms. 30 tablet 1  . albuterol (PROVENTIL HFA;VENTOLIN HFA) 108 (90 BASE) MCG/ACT inhaler Inhale 2 puffs into the lungs every 6 (six) hours as needed for wheezing or shortness of breath.    . Aspirin-Salicylamide-Caffeine (BC HEADACHE POWDER PO) Take 1 packet by mouth 4 (four) times daily as needed (headache).    Marland Kitchen. atenolol (TENORMIN) 25 MG tablet Take 25 mg by mouth daily.    Marland Kitchen. EPINEPHrine (EPIPEN) 0.3 mg/0.3 mL IJ SOAJ injection Inject 0.3 mLs (0.3 mg total) into the muscle once. (Patient not taking: Reported on 04/05/2018) 1 Device 0  . famotidine (PEPCID) 20 MG tablet Take 1 tablet (20 mg total) by mouth 2 (two) times daily. (  Patient not taking: Reported on 04/05/2018) 30 tablet 0  . HYDROcodone-acetaminophen (NORCO/VICODIN) 5-325 MG tablet One tablet every four hours as needed for acute pain. 30 tablet 0  . ibuprofen (ADVIL,MOTRIN) 600 MG tablet Take 1 tablet (600 mg total) by mouth every 6 (six) hours as needed. (Patient not taking: Reported on 04/05/2018) 40 tablet 2   No current facility-administered medications for this visit.      Physical Exam  Blood pressure (!) 151/101, pulse (!) 102, height 5\' 7"  (1.702 m), weight 162 lb (73.5 kg).  Constitutional: overall normal hygiene, normal nutrition, well developed, normal grooming, normal body habitus. Assistive device:none  Musculoskeletal: gait  and station Limp none, muscle tone and strength are normal, no tremors or atrophy is present.  .  Neurological: coordination overall normal.  Deep tendon reflex/nerve stretch intact.  Sensation normal.  Cranial nerves II-XII intact.   Skin:   Normal overall no scars, lesions, ulcers or rashes. No psoriasis.  Psychiatric: Alert and oriented x 3.  Recent memory intact, remote memory unclear.  Normal mood and affect. Well groomed.  Good eye contact.  Cardiovascular: overall no swelling, no varicosities, no edema bilaterally, normal temperatures of the legs and arms, no clubbing, cyanosis and good capillary refill.  Lymphatic: palpation is normal.  Spine/Pelvis examination:  Inspection:  Overall, sacoiliac joint benign and hips nontender; without crepitus or defects.   Thoracic spine inspection: Alignment normal without kyphosis present   Lumbar spine inspection:  Alignment  with normal lumbar lordosis, without scoliosis apparent.   Thoracic spine palpation:  with tenderness of spinal processes   Lumbar spine palpation: with tenderness of lumbar area; with tightness of lumbar muscles    Range of Motion:   Lumbar flexion, forward flexion is 25 with pain or tenderness    Lumbar extension is 5 with pain or tenderness   Left lateral bend is Normal  with pain or tenderness   Right lateral bend is Normal with pain or tenderness   Straight leg raising is Normal   Strength & tone: Normal   Stability overall normal stability   All other systems reviewed and are negative   The patient has been educated about the nature of the problem(s) and counseled on treatment options.  The patient appeared to understand what I have discussed and is in agreement with it.  Encounter Diagnoses  Name Primary?  . 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 Cigarette nicotine dependence without complication     PLAN Call if any problems.  Precautions discussed.  Continue current  medications.   Return to clinic Next Tuesday, OUT OF WORK  I have reviewed the [place] Controlled Substance Reporting System web site prior to prescribing narcotic medicine for this patient.     Electronically Signed Darreld Mclean, MD 12/4/201910:57 AM

## 2018-04-11 ENCOUNTER — Ambulatory Visit: Payer: BLUE CROSS/BLUE SHIELD | Admitting: Orthopaedic Surgery

## 2018-04-11 ENCOUNTER — Encounter: Payer: Self-pay | Admitting: Orthopaedic Surgery

## 2018-04-11 VITALS — BP 140/96 | HR 122 | Wt 167.0 lb

## 2018-04-11 DIAGNOSIS — G8929 Other chronic pain: Secondary | ICD-10-CM

## 2018-04-11 DIAGNOSIS — M5441 Lumbago with sciatica, right side: Secondary | ICD-10-CM | POA: Diagnosis not present

## 2018-04-11 DIAGNOSIS — F17210 Nicotine dependence, cigarettes, uncomplicated: Secondary | ICD-10-CM

## 2018-04-11 MED ORDER — HYDROCODONE-ACETAMINOPHEN 5-325 MG PO TABS: ORAL_TABLET | ORAL | 0 refills | Status: DC

## 2018-04-11 NOTE — Progress Notes (Signed)
Patient Nichole [name]:Alfredo B Henes, female DOB:09/14/1970, 47 y.o. OZH:[id number]  Chief Complaint  Patient presents with  . Back Pain    HPI  [name] is a 47 y.o. female who has acute back pain on top of chronic lower back pain. She is improved.  She has pain but it is less.  She has been taking the medicine. She has no paresthesias or new trauma.  She can return to work with no excessive walking or pulling/pushing anything over 20 pounds.   Body mass index is 26.16 kg/m.  ROS  Review of Systems  Constitutional: Positive for activity change.  Respiratory: Positive for shortness of breath. Negative for cough.   Endocrine: Positive for cold intolerance.  Musculoskeletal: Positive for arthralgias, back pain, gait problem and myalgias.  Allergic/Immunologic: Positive for environmental allergies.  All other systems reviewed and are negative.   All other systems reviewed and are negative.  The following is a summary of the past history medically, past history surgically, known current medicines, social history and family history.  This information is gathered electronically by the computer from prior information and documentation.  I review this each visit and have found including this information at this point in the chart is beneficial and informative.    Past Medical History:  Diagnosis Date  . Arthritis   . Asthma   . Bipolar 1 disorder (HCC)   . COPD (chronic obstructive pulmonary disease) (HCC)   . COPD (chronic obstructive pulmonary disease) (HCC)   . Heart attack (HCC)   . Manic depression (HCC)   . Panic attacks     Past Surgical History:  Procedure Laterality Date  . CESAREAN SECTION    . cyst removed from wrist    . TONSILLECTOMY    . TUBAL LIGATION      Family History  Problem Relation Age of Onset  . Heart disease Mother   . Mental illness Mother   . Hypertension Mother   . Lung disease Father   . Heart disease Father   . Cancer Brother     Social  History Social History   Tobacco Use  . Smoking status: Current Every Day Smoker    Packs/day: 1.00    Types: Cigarettes  . Smokeless tobacco: Never Used  Substance Use Topics  . Alcohol use: Yes    Comment: occ  . Drug use: No    Allergies  Allergen Reactions  . Bee Venom Anaphylaxis and Hives  . Percocet [Oxycodone-Acetaminophen]     headache    Current Outpatient Medications  Medication Sig Dispense Refill  . cyclobenzaprine (FLEXERIL) 5 MG tablet Take 1 tablet (5 mg total) by mouth 3 (three) times daily as needed for muscle spasms. 30 tablet 1  . HYDROcodone-acetaminophen (NORCO/VICODIN) 5-325 MG tablet One tablet every four hours as needed for acute pain. 30 tablet 0  . albuterol (PROVENTIL HFA;VENTOLIN HFA) 108 (90 BASE) MCG/ACT inhaler Inhale 2 puffs into the lungs every 6 (six) hours as needed for wheezing or shortness of breath.    . Aspirin-Salicylamide-Caffeine (BC HEADACHE POWDER PO) Take 1 packet by mouth 4 (four) times daily as needed (headache).    Marland Kitchen. atenolol (TENORMIN) 25 MG tablet Take 25 mg by mouth daily.    Marland Kitchen. EPINEPHrine (EPIPEN) 0.3 mg/0.3 mL IJ SOAJ injection Inject 0.3 mLs (0.3 mg total) into the muscle once. (Patient not taking: Reported on 04/05/2018) 1 Device 0  . famotidine (PEPCID) 20 MG tablet Take 1 tablet (20 mg total) by  mouth 2 (two) times daily. (Patient not taking: Reported on 04/05/2018) 30 tablet 0  . ibuprofen (ADVIL,MOTRIN) 600 MG tablet Take 1 tablet (600 mg total) by mouth every 6 (six) hours as needed. (Patient not taking: Reported on 04/05/2018) 40 tablet 2   No current facility-administered medications for this visit.      Physical Exam  Blood pressure (!) 140/96, pulse (!) 122, weight 167 lb (75.8 kg).  Constitutional: overall normal hygiene, normal nutrition, well developed, normal grooming, normal body habitus. Assistive device:none  Musculoskeletal: gait and station Limp none, muscle tone and strength are normal, no tremors or  atrophy is present.  .  Neurological: coordination overall normal.  Deep tendon reflex/nerve stretch intact.  Sensation normal.  Cranial nerves II-XII intact.   Skin:   Normal overall no scars, lesions, ulcers or rashes. No psoriasis.  Psychiatric: Alert and oriented x 3.  Recent memory intact, remote memory unclear.  Normal mood and affect. Well groomed.  Good eye contact.  Cardiovascular: overall no swelling, no varicosities, no edema bilaterally, normal temperatures of the legs and arms, no clubbing, cyanosis and good capillary refill.  Lymphatic: palpation is normal.  Spine/Pelvis examination:  Inspection:  Overall, sacoiliac joint benign and hips nontender; without crepitus or defects.   Thoracic spine inspection: Alignment normal without kyphosis present   Lumbar spine inspection:  Alignment  with normal lumbar lordosis, without scoliosis apparent.   Thoracic spine palpation:  without tenderness of spinal processes   Lumbar spine palpation: without tenderness of lumbar area; without tightness of lumbar muscles    Range of Motion:   Lumbar flexion, forward flexion is normal without pain or tenderness    Lumbar extension is full without pain or tenderness   Left lateral bend is normal without pain or tenderness   Right lateral bend is normal without pain or tenderness   Straight leg raising is normal  Strength & tone: normal   Stability overall normal stability  All other systems reviewed and are negative   The patient has been educated about the nature of the problem(s) and counseled on treatment options.  The patient appeared to understand what I have discussed and is in agreement with it.  Encounter Diagnoses  Name Primary?  . 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 Cigarette nicotine dependence without complication     PLAN Call if any problems.  Precautions discussed.  Continue current medications.   Return to clinic 1  month   Return to work with restrictions.  I have reviewed the [place] Controlled Substance Reporting System web site prior to prescribing narcotic medicine for this patient.     Electronically Signed Darreld Mclean, MD 12/10/20199:27 AM

## 2018-04-11 NOTE — Patient Instructions (Addendum)
Steps to Quit Smoking Smoking tobacco can be bad for your health. It can also affect almost every organ in your body. Smoking puts you and people around you at risk for many serious long-lasting (chronic) diseases. Quitting smoking is hard, but it is one of the best things that you can do for your health. It is never too late to quit. What are the benefits of quitting smoking? When you quit smoking, you lower your risk for getting serious diseases and conditions. They can include:  Lung cancer or lung disease.  Heart disease.  Stroke.  Heart attack.  Not being able to have children (infertility).  Weak bones (osteoporosis) and broken bones (fractures).  If you have coughing, wheezing, and shortness of breath, those symptoms may get better when you quit. You may also get sick less often. If you are pregnant, quitting smoking can help to lower your chances of having a baby of low birth weight. What can I do to help me quit smoking? Talk with your doctor about what can help you quit smoking. Some things you can do (strategies) include:  Quitting smoking totally, instead of slowly cutting back how much you smoke over a period of time.  Going to in-person counseling. You are more likely to quit if you go to many counseling sessions.  Using resources and support systems, such as: ? Online chats with a counselor. ? Phone quitlines. ? Printed self-help materials. ? Support groups or group counseling. ? Text messaging programs. ? Mobile phone apps or applications.  Taking medicines. Some of these medicines may have nicotine in them. If you are pregnant or breastfeeding, do not take any medicines to quit smoking unless your doctor says it is okay. Talk with your doctor about counseling or other things that can help you.  Talk with your doctor about using more than one strategy at the same time, such as taking medicines while you are also going to in-person counseling. This can help make  quitting easier. What things can I do to make it easier to quit? Quitting smoking might feel very hard at first, but there is a lot that you can do to make it easier. Take these steps:  Talk to your family and friends. Ask them to support and encourage you.  Call phone quitlines, reach out to support groups, or work with a counselor.  Ask people who smoke to not smoke around you.  Avoid places that make you want (trigger) to smoke, such as: ? Bars. ? Parties. ? Smoke-break areas at work.  Spend time with people who do not smoke.  Lower the stress in your life. Stress can make you want to smoke. Try these things to help your stress: ? Getting regular exercise. ? Deep-breathing exercises. ? Yoga. ? Meditating. ? Doing a body scan. To do this, close your eyes, focus on one area of your body at a time from head to toe, and notice which parts of your body are tense. Try to relax the muscles in those areas.  Download or buy apps on your mobile phone or tablet that can help you stick to your quit plan. There are many free apps, such as QuitGuide from the CDC (Centers for Disease Control and Prevention). You can find more support from smokefree.gov and other websites.  This information is not intended to replace advice given to you by your health care provider. Make sure you discuss any questions you have with your health care provider. Document Released: 02/13/2009 Document   Revised: 12/16/2015 Document Reviewed: 09/03/2014 Elsevier Interactive Patient Education  [employer]2018 Elsevier Inc.   Work restrictions: No excessive walking or pulling/pushing anything over 20 pounds.

## 2018-05-09 ENCOUNTER — Encounter: Payer: Self-pay | Admitting: Orthopaedic Surgery

## 2018-05-09 ENCOUNTER — Ambulatory Visit: Payer: BLUE CROSS/BLUE SHIELD | Admitting: Orthopaedic Surgery

## 2018-05-09 VITALS — BP 148/88 | HR 96 | Ht 67.0 in | Wt 162.0 lb

## 2018-05-09 DIAGNOSIS — G8929 Other chronic pain: Secondary | ICD-10-CM

## 2018-05-09 DIAGNOSIS — F17210 Nicotine dependence, cigarettes, uncomplicated: Secondary | ICD-10-CM

## 2018-05-09 DIAGNOSIS — M5441 Lumbago with sciatica, right side: Secondary | ICD-10-CM

## 2018-05-09 MED ORDER — CYCLOBENZAPRINE HCL 5 MG PO TABS: 5.0000 mg | ORAL_TABLET | Freq: Three times a day (TID) | ORAL | 1 refills | Status: DC | PRN

## 2018-05-09 NOTE — Progress Notes (Signed)
Patient Nichole [name], female DOB:15-May-1970, 48 y.o. DTH:[id number]  Chief Complaint  Patient presents with  . Back Pain    HPI  Nichole [name] is a 48 y.o. female who has chronic lower back pain.  She has more pain on cold days like today.  We have rain also.  She has no new trauma.  She is taking her medicine. She has no weakness. She is taking her medicine.     Body mass index is 25.37 kg/m.  ROS  Review of Systems  Constitutional: Positive for activity change.  Respiratory: Positive for shortness of breath. Negative for cough.   Endocrine: Positive for cold intolerance.  Musculoskeletal: Positive for arthralgias, back pain, gait problem and myalgias.  Allergic/Immunologic: Positive for environmental allergies.  All other systems reviewed and are negative.   All other systems reviewed and are negative.  The following is a summary of the past history medically, past history surgically, known current medicines, social history and family history.  This information is gathered electronically by the computer from prior information and documentation.  I review this each visit and have found including this information at this point in the chart is beneficial and informative.    Past Medical History:  Diagnosis Date  . Arthritis   . Asthma   . Bipolar 1 disorder (HCC)   . COPD (chronic obstructive pulmonary disease) (HCC)   . COPD (chronic obstructive pulmonary disease) (HCC)   . Heart attack (HCC)   . Manic depression (HCC)   . Panic attacks     Past Surgical History:  Procedure Laterality Date  . CESAREAN SECTION    . cyst removed from wrist    . TONSILLECTOMY    . TUBAL LIGATION      Family History  Problem Relation Age of Onset  . Heart disease Mother   . Mental illness Mother   . Hypertension Mother   . Lung disease Father   . Heart disease Father   . Cancer Brother     Social History Social History   Tobacco Use  . Smoking status: Current Every Day  Smoker    Packs/day: 1.00    Types: Cigarettes  . Smokeless tobacco: Never Used  Substance Use Topics  . Alcohol use: Yes    Comment: occ  . Drug use: No    Allergies  Allergen Reactions  . Bee Venom Anaphylaxis and Hives  . Percocet [Oxycodone-Acetaminophen]     headache    Current Outpatient Medications  Medication Sig Dispense Refill  . albuterol (PROVENTIL HFA;VENTOLIN HFA) 108 (90 BASE) MCG/ACT inhaler Inhale 2 puffs into the lungs every 6 (six) hours as needed for wheezing or shortness of breath.    . Aspirin-Salicylamide-Caffeine (BC HEADACHE POWDER PO) Take 1 packet by mouth 4 (four) times daily as needed (headache).    Marland Kitchen atenolol (TENORMIN) 25 MG tablet Take 25 mg by mouth daily.    . cyclobenzaprine (FLEXERIL) 5 MG tablet Take 1 tablet (5 mg total) by mouth 3 (three) times daily as needed for muscle spasms. 30 tablet 1  . EPINEPHrine (EPIPEN) 0.3 mg/0.3 mL IJ SOAJ injection Inject 0.3 mLs (0.3 mg total) into the muscle once. (Patient not taking: Reported on 04/05/2018) 1 Device 0  . famotidine (PEPCID) 20 MG tablet Take 1 tablet (20 mg total) by mouth 2 (two) times daily. (Patient not taking: Reported on 04/05/2018) 30 tablet 0  . HYDROcodone-acetaminophen (NORCO/VICODIN) 5-325 MG tablet One tablet every four hours as needed for  acute pain. 30 tablet 0  . ibuprofen (ADVIL,MOTRIN) 600 MG tablet Take 1 tablet (600 mg total) by mouth every 6 (six) hours as needed. (Patient not taking: Reported on 04/05/2018) 40 tablet 2   No current facility-administered medications for this visit.      Physical Exam  Blood pressure (!) 148/88, pulse 96, height 5\' 7"  (1.702 m), weight 162 lb (73.5 kg).  Constitutional: overall normal hygiene, normal nutrition, well developed, normal grooming, normal body habitus. Assistive device:none  Musculoskeletal: gait and station Limp none, muscle tone and strength are normal, no tremors or atrophy is present.  .  Neurological: coordination overall  normal.  Deep tendon reflex/nerve stretch intact.  Sensation normal.  Cranial nerves II-XII intact.   Skin:   Normal overall no scars, lesions, ulcers or rashes. No psoriasis.  Psychiatric: Alert and oriented x 3.  Recent memory intact, remote memory unclear.  Normal mood and affect. Well groomed.  Good eye contact.  Cardiovascular: overall no swelling, no varicosities, no edema bilaterally, normal temperatures of the legs and arms, no clubbing, cyanosis and good capillary refill.  Lymphatic: palpation is normal.  Spine/Pelvis examination:  Inspection:  Overall, sacoiliac joint benign and hips nontender; without crepitus or defects.   Thoracic spine inspection: Alignment normal without kyphosis present   Lumbar spine inspection:  Alignment  with normal lumbar lordosis, without scoliosis apparent.   Thoracic spine palpation:  without tenderness of spinal processes   Lumbar spine palpation: without tenderness of lumbar area; without tightness of lumbar muscles    Range of Motion:   Lumbar flexion, forward flexion is normal without pain or tenderness    Lumbar extension is full without pain or tenderness   Left lateral bend is normal without pain or tenderness   Right lateral bend is normal without pain or tenderness   Straight leg raising is normal  Strength & tone: normal   Stability overall normal stability  All other systems reviewed and are negative   The patient has been educated about the nature of the problem(s) and counseled on treatment options.  The patient appeared to understand what I have discussed and is in agreement with it.  Encounter Diagnoses  Name Primary?  . 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 Cigarette nicotine dependence without complication     PLAN Call if any problems.  Precautions discussed.  Continue current medications.   Return to clinic 1 month   Electronically Signed Darreld Mclean, MD 1/7/20208:59 AM

## 2018-05-09 NOTE — Patient Instructions (Signed)
Steps to Quit Smoking    Smoking tobacco can be bad for your health. It can also affect almost every organ in your body. Smoking puts you and people around you at risk for many serious long-lasting (chronic) diseases. Quitting smoking is hard, but it is one of the best things that you can do for your health. It is never too late to quit.  What are the benefits of quitting smoking?  When you quit smoking, you lower your risk for getting serious diseases and conditions. They can include:  · Lung cancer or lung disease.  · Heart disease.  · Stroke.  · Heart attack.  · Not being able to have children (infertility).  · Weak bones (osteoporosis) and broken bones (fractures).  If you have coughing, wheezing, and shortness of breath, those symptoms may get better when you quit. You may also get sick less often. If you are pregnant, quitting smoking can help to lower your chances of having a baby of low birth weight.  What can I do to help me quit smoking?  Talk with your doctor about what can help you quit smoking. Some things you can do (strategies) include:  · Quitting smoking totally, instead of slowly cutting back how much you smoke over a period of time.  · Going to in-person counseling. You are more likely to quit if you go to many counseling sessions.  · Using resources and support systems, such as:  ? Online chats with a counselor.  ? Phone quitlines.  ? Printed self-help materials.  ? Support groups or group counseling.  ? Text messaging programs.  ? Mobile phone apps or applications.  · Taking medicines. Some of these medicines may have nicotine in them. If you are pregnant or breastfeeding, do not take any medicines to quit smoking unless your doctor says it is okay. Talk with your doctor about counseling or other things that can help you.  Talk with your doctor about using more than one strategy at the same time, such as taking medicines while you are also going to in-person counseling. This can help make  quitting easier.  What things can I do to make it easier to quit?  Quitting smoking might feel very hard at first, but there is a lot that you can do to make it easier. Take these steps:  · Talk to your family and friends. Ask them to support and encourage you.  · Call phone quitlines, reach out to support groups, or work with a counselor.  · Ask people who smoke to not smoke around you.  · Avoid places that make you want (trigger) to smoke, such as:  ? Bars.  ? Parties.  ? Smoke-break areas at work.  · Spend time with people who do not smoke.  · Lower the stress in your life. Stress can make you want to smoke. Try these things to help your stress:  ? Getting regular exercise.  ? Deep-breathing exercises.  ? Yoga.  ? Meditating.  ? Doing a body scan. To do this, close your eyes, focus on one area of your body at a time from head to toe, and notice which parts of your body are tense. Try to relax the muscles in those areas.  · Download or buy apps on your mobile phone or tablet that can help you stick to your quit plan. There are many free apps, such as QuitGuide from the CDC (Centers for Disease Control and Prevention). You can find more   support from smokefree.gov and other websites.  This information is not intended to replace advice given to you by your health care provider. Make sure you discuss any questions you have with your health care provider.  Document Released: 02/13/2009 Document Revised: 12/16/2015 Document Reviewed: 09/03/2014  Elsevier Interactive Patient Education © 2019 [employer] Inc.

## 2018-06-06 ENCOUNTER — Encounter: Payer: Self-pay | Admitting: Orthopaedic Surgery

## 2018-06-06 ENCOUNTER — Ambulatory Visit: Payer: BLUE CROSS/BLUE SHIELD | Admitting: Orthopaedic Surgery

## 2018-06-06 VITALS — BP 131/90 | HR 80 | Ht 67.0 in | Wt 166.0 lb

## 2018-06-06 DIAGNOSIS — M5441 Lumbago with sciatica, right side: Secondary | ICD-10-CM | POA: Diagnosis not present

## 2018-06-06 DIAGNOSIS — G8929 Other chronic pain: Secondary | ICD-10-CM

## 2018-06-06 DIAGNOSIS — F17210 Nicotine dependence, cigarettes, uncomplicated: Secondary | ICD-10-CM | POA: Diagnosis not present

## 2018-06-06 MED ORDER — CYCLOBENZAPRINE HCL 5 MG PO TABS: 5.0000 mg | ORAL_TABLET | Freq: Three times a day (TID) | ORAL | 1 refills | Status: DC | PRN

## 2018-06-06 NOTE — Progress Notes (Signed)
Patient QB:HALPFX B Nichole [name], female DOB:Apr 03, 1971, 48 y.o. TKW:[id number]  Chief Complaint  Patient presents with  . Back Pain    HPI  Nichole [name] is a 48 y.o. female who has continued lower back pain with right sided sciatica.  She is not better.  She is taking Flexeril that helps at night. She has no new trauma. She has no weakness. She is tired of hurting.  I will get MRI of the lumbar spine.   Body mass index is 26 kg/m.  ROS  Review of Systems  Constitutional: Positive for activity change.  Respiratory: Positive for shortness of breath. Negative for cough.   Endocrine: Positive for cold intolerance.  Musculoskeletal: Positive for arthralgias, back pain, gait problem and myalgias.  Allergic/Immunologic: Positive for environmental allergies.  All other systems reviewed and are negative.   All other systems reviewed and are negative.  The following is a summary of the past history medically, past history surgically, known current medicines, social history and family history.  This information is gathered electronically by the computer from prior information and documentation.  I review this each visit and have found including this information at this point in the chart is beneficial and informative.    Past Medical History:  Diagnosis Date  . Arthritis   . Asthma   . Bipolar 1 disorder (HCC)   . COPD (chronic obstructive pulmonary disease) (HCC)   . COPD (chronic obstructive pulmonary disease) (HCC)   . Heart attack (HCC)   . Manic depression (HCC)   . Panic attacks     Past Surgical History:  Procedure Laterality Date  . CESAREAN SECTION    . cyst removed from wrist    . TONSILLECTOMY    . TUBAL LIGATION      Family History  Problem Relation Age of Onset  . Heart disease Mother   . Mental illness Mother   . Hypertension Mother   . Lung disease Father   . Heart disease Father   . Cancer Brother     Social History Social History   Tobacco Use  .  Smoking status: Current Every Day Smoker    Packs/day: 1.00    Types: Cigarettes  . Smokeless tobacco: Never Used  Substance Use Topics  . Alcohol use: Yes    Comment: occ  . Drug use: No    Allergies  Allergen Reactions  . Bee Venom Anaphylaxis and Hives  . Percocet [Oxycodone-Acetaminophen]     headache    Current Outpatient Medications  Medication Sig Dispense Refill  . Aspirin-Salicylamide-Caffeine (BC HEADACHE POWDER PO) Take 1 packet by mouth 4 (four) times daily as needed (headache).    . cyclobenzaprine (FLEXERIL) 5 MG tablet Take 1 tablet (5 mg total) by mouth 3 (three) times daily as needed for muscle spasms. 30 tablet 1  . HYDROcodone-acetaminophen (NORCO/VICODIN) 5-325 MG tablet One tablet every four hours as needed for acute pain. 30 tablet 0  . albuterol (PROVENTIL HFA;VENTOLIN HFA) 108 (90 BASE) MCG/ACT inhaler Inhale 2 puffs into the lungs every 6 (six) hours as needed for wheezing or shortness of breath.    Marland Kitchen atenolol (TENORMIN) 25 MG tablet Take 25 mg by mouth daily.    Marland Kitchen EPINEPHrine (EPIPEN) 0.3 mg/0.3 mL IJ SOAJ injection Inject 0.3 mLs (0.3 mg total) into the muscle once. (Patient not taking: Reported on 04/05/2018) 1 Device 0  . famotidine (PEPCID) 20 MG tablet Take 1 tablet (20 mg total) by mouth 2 (two) times daily. (Patient  not taking: Reported on 04/05/2018) 30 tablet 0  . ibuprofen (ADVIL,MOTRIN) 600 MG tablet Take 1 tablet (600 mg total) by mouth every 6 (six) hours as needed. (Patient not taking: Reported on 04/05/2018) 40 tablet 2   No current facility-administered medications for this visit.      Physical Exam  Blood pressure 131/90, pulse 80, height 5\' 7"  (1.702 m), weight 166 lb (75.3 kg).  Constitutional: overall normal hygiene, normal nutrition, well developed, normal grooming, normal body habitus. Assistive device:none  Musculoskeletal: gait and station Limp none, muscle tone and strength are normal, no tremors or atrophy is present.  .   Neurological: coordination overall normal.  Deep tendon reflex/nerve stretch intact.  Sensation normal.  Cranial nerves II-XII intact.   Skin:   Normal overall no scars, lesions, ulcers or rashes. No psoriasis.  Psychiatric: Alert and oriented x 3.  Recent memory intact, remote memory unclear.  Normal mood and affect. Well groomed.  Good eye contact.  Cardiovascular: overall no swelling, no varicosities, no edema bilaterally, normal temperatures of the legs and arms, no clubbing, cyanosis and good capillary refill.  Lymphatic: palpation is normal.  Spine/Pelvis examination:  Inspection:  Overall, sacoiliac joint benign and hips nontender; without crepitus or defects.   Thoracic spine inspection: Alignment normal without kyphosis present   Lumbar spine inspection:  Alignment  with normal lumbar lordosis, without scoliosis apparent.   Thoracic spine palpation:  without tenderness of spinal processes   Lumbar spine palpation: without tenderness of lumbar area; without tightness of lumbar muscles    Range of Motion:   Lumbar flexion, forward flexion is normal without pain or tenderness    Lumbar extension is full without pain or tenderness   Left lateral bend is normal without pain or tenderness   Right lateral bend is normal without pain or tenderness   Straight leg raising is normal  Strength & tone: normal   Stability overall normal stability  All other systems reviewed and are negative   The patient has been educated about the nature of the problem(s) and counseled on treatment options.  The patient appeared to understand what I have discussed and is in agreement with it.  Encounter Diagnoses  Name Primary?  . 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 Cigarette nicotine dependence without complication     PLAN Call if any problems.  Precautions discussed.  Continue current medications.   Return to clinic MRi lumbar spine.   Electronically  Signed Darreld McleanWayne Lisset Ketchem, MD 2/4/20208:57 AM

## 2018-06-06 NOTE — Patient Instructions (Signed)
Steps to Quit Smoking    Smoking tobacco can be bad for your health. It can also affect almost every organ in your body. Smoking puts you and people around you at risk for many serious long-lasting (chronic) diseases. Quitting smoking is hard, but it is one of the best things that you can do for your health. It is never too late to quit.  What are the benefits of quitting smoking?  When you quit smoking, you lower your risk for getting serious diseases and conditions. They can include:  · Lung cancer or lung disease.  · Heart disease.  · Stroke.  · Heart attack.  · Not being able to have children (infertility).  · Weak bones (osteoporosis) and broken bones (fractures).  If you have coughing, wheezing, and shortness of breath, those symptoms may get better when you quit. You may also get sick less often. If you are pregnant, quitting smoking can help to lower your chances of having a baby of low birth weight.  What can I do to help me quit smoking?  Talk with your doctor about what can help you quit smoking. Some things you can do (strategies) include:  · Quitting smoking totally, instead of slowly cutting back how much you smoke over a period of time.  · Going to in-person counseling. You are more likely to quit if you go to many counseling sessions.  · Using resources and support systems, such as:  ? Online chats with a counselor.  ? Phone quitlines.  ? Printed self-help materials.  ? Support groups or group counseling.  ? Text messaging programs.  ? Mobile phone apps or applications.  · Taking medicines. Some of these medicines may have nicotine in them. If you are pregnant or breastfeeding, do not take any medicines to quit smoking unless your doctor says it is okay. Talk with your doctor about counseling or other things that can help you.  Talk with your doctor about using more than one strategy at the same time, such as taking medicines while you are also going to in-person counseling. This can help make  quitting easier.  What things can I do to make it easier to quit?  Quitting smoking might feel very hard at first, but there is a lot that you can do to make it easier. Take these steps:  · Talk to your family and friends. Ask them to support and encourage you.  · Call phone quitlines, reach out to support groups, or work with a counselor.  · Ask people who smoke to not smoke around you.  · Avoid places that make you want (trigger) to smoke, such as:  ? Bars.  ? Parties.  ? Smoke-break areas at work.  · Spend time with people who do not smoke.  · Lower the stress in your life. Stress can make you want to smoke. Try these things to help your stress:  ? Getting regular exercise.  ? Deep-breathing exercises.  ? Yoga.  ? Meditating.  ? Doing a body scan. To do this, close your eyes, focus on one area of your body at a time from head to toe, and notice which parts of your body are tense. Try to relax the muscles in those areas.  · Download or buy apps on your mobile phone or tablet that can help you stick to your quit plan. There are many free apps, such as QuitGuide from the CDC (Centers for Disease Control and Prevention). You can find more   support from smokefree.gov and other websites.  This information is not intended to replace advice given to you by your health care provider. Make sure you discuss any questions you have with your health care provider.  Document Released: 02/13/2009 Document Revised: 12/16/2015 Document Reviewed: 09/03/2014  Elsevier Interactive Patient Education © 2019 [employer] Inc.

## 2018-06-06 NOTE — Addendum Note (Signed)
Addended by: Earnstine Regal on: 06/06/2018 10:45 AM   Modules accepted: Orders

## 2018-06-14 ENCOUNTER — Ambulatory Visit (HOSPITAL_COMMUNITY): Payer: BLUE CROSS/BLUE SHIELD

## 2018-06-20 ENCOUNTER — Ambulatory Visit (HOSPITAL_COMMUNITY)
Admission: RE | Admit: 2018-06-20 | Discharge: 2018-06-20 | Disposition: A | Discharge status: Home / Self Care | Payer: BLUE CROSS/BLUE SHIELD | Source: Ambulatory Visit | Attending: Orthopaedic Surgery | Admitting: Orthopaedic Surgery

## 2018-06-20 DIAGNOSIS — M5441 Lumbago with sciatica, right side: Secondary | ICD-10-CM | POA: Insufficient documentation

## 2018-06-20 DIAGNOSIS — G8929 Other chronic pain: Secondary | ICD-10-CM

## 2018-08-01 ENCOUNTER — Other Ambulatory Visit: Payer: Self-pay | Admitting: Orthopaedic Surgery

## 2018-08-16 ENCOUNTER — Other Ambulatory Visit: Payer: Self-pay | Admitting: Orthopaedic Surgery

## 2018-08-31 ENCOUNTER — Other Ambulatory Visit: Payer: Self-pay | Admitting: Orthopaedic Surgery

## 2018-09-20 ENCOUNTER — Other Ambulatory Visit: Payer: Self-pay | Admitting: Orthopaedic Surgery

## 2018-09-27 ENCOUNTER — Telehealth: Payer: Self-pay | Admitting: Orthopaedic Surgery

## 2018-09-27 NOTE — Telephone Encounter (Signed)
Cyclobenzaprine 5 mg  Qty  30 Tablets  PATIENT USES [facility] IN [place]

## 2018-09-28 MED ORDER — CYCLOBENZAPRINE HCL 5 MG PO TABS: 5.0000 mg | ORAL_TABLET | Freq: Three times a day (TID) | ORAL | 0 refills | Status: DC | PRN

## 2018-10-13 ENCOUNTER — Other Ambulatory Visit: Payer: Self-pay | Admitting: Orthopaedic Surgery

## 2018-10-20 ENCOUNTER — Telehealth: Payer: Self-pay | Admitting: Orthopaedic Surgery

## 2018-10-20 NOTE — Telephone Encounter (Signed)
Cyclobenzaprine 5 mg  Qty 30 Tablets  PATIENT USES [place]  [facility]  Last visit was 06/06/18

## 2018-10-24 MED ORDER — CYCLOBENZAPRINE HCL 5 MG PO TABS: 5.0000 mg | ORAL_TABLET | Freq: Three times a day (TID) | ORAL | 0 refills | Status: DC | PRN

## 2018-11-02 ENCOUNTER — Other Ambulatory Visit: Payer: Self-pay

## 2018-11-02 ENCOUNTER — Ambulatory Visit: Payer: BC Managed Care – PPO | Admitting: Orthopaedic Surgery

## 2018-11-02 ENCOUNTER — Encounter: Payer: Self-pay | Admitting: Orthopaedic Surgery

## 2018-11-02 VITALS — BP 126/94 | HR 86 | Temp 97.7°F | Ht 67.0 in | Wt 162.0 lb

## 2018-11-02 DIAGNOSIS — G8929 Other chronic pain: Secondary | ICD-10-CM | POA: Diagnosis not present

## 2018-11-02 DIAGNOSIS — F17210 Nicotine dependence, cigarettes, uncomplicated: Secondary | ICD-10-CM

## 2018-11-02 DIAGNOSIS — M5441 Lumbago with sciatica, right side: Secondary | ICD-10-CM

## 2018-11-02 MED ORDER — CYCLOBENZAPRINE HCL 5 MG PO TABS: 5.0000 mg | ORAL_TABLET | Freq: Three times a day (TID) | ORAL | 3 refills | Status: DC | PRN

## 2018-11-02 MED ORDER — HYDROCODONE-ACETAMINOPHEN 5-325 MG PO TABS: ORAL_TABLET | ORAL | 0 refills | Status: DC

## 2018-11-02 NOTE — Progress Notes (Signed)
Patient ZO:XWRUEA:Nichole [name], female DOB:02/26/1971, 48 y.o. [id number]  Chief Complaint  Patient presents with  . Back Pain    HPI  Nichole [name] is a 48 y.o. female who has chronic lower back pain. She had a MRI which was negative.  She continues to have lower back pani with right sided sciatica.  She has no weakness.  She is taking her Flexeril which helps. She has no new trauma.   Body mass index is 25.37 kg/m.  ROS  Review of Systems  Constitutional: Positive for activity change.  Respiratory: Positive for shortness of breath. Negative for cough.   Endocrine: Positive for cold intolerance.  Musculoskeletal: Positive for arthralgias, back pain, gait problem and myalgias.  Allergic/Immunologic: Positive for environmental allergies.  All other systems reviewed and are negative.   All other systems reviewed and are negative.  The following is a summary of the past history medically, past history surgically, known current medicines, social history and family history.  This information is gathered electronically by the computer from prior information and documentation.  I review this each visit and have found including this information at this point in the chart is beneficial and informative.    Past Medical History:  Diagnosis Date  . Arthritis   . Asthma   . Bipolar 1 disorder (HCC)   . COPD (chronic obstructive pulmonary disease) (HCC)   . COPD (chronic obstructive pulmonary disease) (HCC)   . Heart attack (HCC)   . Manic depression (HCC)   . Panic attacks     Past Surgical History:  Procedure Laterality Date  . CESAREAN SECTION    . cyst removed from wrist    . TONSILLECTOMY    . TUBAL LIGATION      Family History  Problem Relation Age of Onset  . Heart disease Mother   . Mental illness Mother   . Hypertension Mother   . Lung disease Father   . Heart disease Father   . Cancer Brother     Social History Social History   Tobacco Use  . Smoking  status: Current Every Day Smoker    Packs/day: 1.00    Types: Cigarettes  . Smokeless tobacco: Never Used  Substance Use Topics  . Alcohol use: Yes    Comment: occ  . Drug use: No    Allergies  Allergen Reactions  . Bee Venom Anaphylaxis and Hives  . Percocet [Oxycodone-Acetaminophen]     headache    Current Outpatient Medications  Medication Sig Dispense Refill  . albuterol (PROVENTIL HFA;VENTOLIN HFA) 108 (90 BASE) MCG/ACT inhaler Inhale 2 puffs into the lungs every 6 (six) hours as needed for wheezing or shortness of breath.    . Aspirin-Salicylamide-Caffeine (BC HEADACHE POWDER PO) Take 1 packet by mouth 4 (four) times daily as needed (headache).    Marland Kitchen. atenolol (TENORMIN) 25 MG tablet Take 25 mg by mouth daily.    . cyclobenzaprine (FLEXERIL) 5 MG tablet Take 1 tablet (5 mg total) by mouth 3 (three) times daily as needed. for muscle spams 30 tablet 3  . EPINEPHrine (EPIPEN) 0.3 mg/0.3 mL IJ SOAJ injection Inject 0.3 mLs (0.3 mg total) into the muscle once. (Patient not taking: Reported on 04/05/2018) 1 Device 0  . famotidine (PEPCID) 20 MG tablet Take 1 tablet (20 mg total) by mouth 2 (two) times daily. (Patient not taking: Reported on 04/05/2018) 30 tablet 0  . HYDROcodone-acetaminophen (NORCO/VICODIN) 5-325 MG tablet One tablet every four hours as needed for acute  pain. 30 tablet 0  . ibuprofen (ADVIL,MOTRIN) 600 MG tablet Take 1 tablet (600 mg total) by mouth every 6 (six) hours as needed. (Patient not taking: Reported on 04/05/2018) 40 tablet 2   No current facility-administered medications for this visit.      Physical Exam  Blood pressure (!) 126/94, pulse 86, temperature 97.7 F (36.5 C), height 5\' 7"  (1.702 m), weight 162 lb (73.5 kg).  Constitutional: overall normal hygiene, normal nutrition, well developed, normal grooming, normal body habitus. Assistive device:none  Musculoskeletal: gait and station Limp none, muscle tone and strength are normal, no tremors or  atrophy is present.  .  Neurological: coordination overall normal.  Deep tendon reflex/nerve stretch intact.  Sensation normal.  Cranial nerves II-XII intact.   Skin:   Normal overall no scars, lesions, ulcers or rashes. No psoriasis.  Psychiatric: Alert and oriented x 3.  Recent memory intact, remote memory unclear.  Normal mood and affect. Well groomed.  Good eye contact.  Cardiovascular: overall no swelling, no varicosities, no edema bilaterally, normal temperatures of the legs and arms, no clubbing, cyanosis and good capillary refill.  Lymphatic: palpation is normal.  Spine/Pelvis examination:  Inspection:  Overall, sacoiliac joint benign and hips nontender; without crepitus or defects.   Thoracic spine inspection: Alignment normal without kyphosis present   Lumbar spine inspection:  Alignment  with normal lumbar lordosis, without scoliosis apparent.   Thoracic spine palpation:  without tenderness of spinal processes   Lumbar spine palpation: without tenderness of lumbar area; without tightness of lumbar muscles    Range of Motion:   Lumbar flexion, forward flexion is normal without pain or tenderness    Lumbar extension is full without pain or tenderness   Left lateral bend is normal without pain or tenderness   Right lateral bend is normal without pain or tenderness   Straight leg raising is normal  Strength & tone: normal   Stability overall normal stability  All other systems reviewed and are negative   The patient has been educated about the nature of the problem(s) and counseled on treatment options.  The patient appeared to understand what I have discussed and is in agreement with it.  Encounter Diagnoses  Name Primary?  . 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 Cigarette nicotine dependence without complication     PLAN Call if any problems.  Precautions discussed.  Continue current medications.   Return to clinic 6 weeks   I  have given pain medicine also.  I have reviewed the [place] web site prior to prescribing narcotic medicine for this patient.   Electronically Signed Sanjuana Kava, MD 7/2/20208:36 AM

## 2018-12-14 ENCOUNTER — Ambulatory Visit (INDEPENDENT_AMBULATORY_CARE_PROVIDER_SITE_OTHER): Payer: BC Managed Care – PPO | Admitting: Orthopaedic Surgery

## 2018-12-14 ENCOUNTER — Other Ambulatory Visit: Payer: Self-pay

## 2018-12-14 ENCOUNTER — Encounter: Payer: Self-pay | Admitting: Orthopaedic Surgery

## 2018-12-14 VITALS — BP 156/95 | HR 102 | Ht 68.0 in | Wt 165.5 lb

## 2018-12-14 DIAGNOSIS — G8929 Other chronic pain: Secondary | ICD-10-CM | POA: Diagnosis not present

## 2018-12-14 DIAGNOSIS — M5441 Lumbago with sciatica, right side: Secondary | ICD-10-CM | POA: Diagnosis not present

## 2018-12-14 DIAGNOSIS — F17210 Nicotine dependence, cigarettes, uncomplicated: Secondary | ICD-10-CM

## 2018-12-14 MED ORDER — HYDROCODONE-ACETAMINOPHEN 5-325 MG PO TABS: ORAL_TABLET | ORAL | 0 refills | Status: DC

## 2018-12-14 NOTE — Patient Instructions (Signed)

## 2018-12-14 NOTE — Progress Notes (Signed)
[name], female DOB:02-Jul-1970, 48 y.o. [id number]  Chief Complaint  Patient presents with  . Back Pain    Hurting/L Hip kept me up last night    HPI  Nichole [name] is a 48 y.o. female who has chronic lower back pain.  She has had negative x-rays and MRI.  She continues to hurt.  She has no new trauma, no weakness.  She has been to PT and does her exercises every day.   Body mass index is 25.16 kg/m.  ROS  Review of Systems  Constitutional: Positive for activity change.  Respiratory: Positive for shortness of breath. Negative for cough.   Endocrine: Positive for cold intolerance.  Musculoskeletal: Positive for arthralgias, back pain, gait problem and myalgias.  Allergic/Immunologic: Positive for environmental allergies.  All other systems reviewed and are negative.   All other systems reviewed and are negative.  The following is a summary of the past history medically, past history surgically, known current medicines, social history and family history.  This information is gathered electronically by the computer from prior information and documentation.  I review this each visit and have found including this information at this point in the chart is beneficial and informative.    Past Medical History:  Diagnosis Date  . Arthritis   . Asthma   . Bipolar 1 disorder ([place])   . COPD (chronic obstructive pulmonary disease) ([place])   . COPD (chronic obstructive pulmonary disease) ([place])   . Heart attack ([place])   . Manic depression ([place])   . Panic attacks     Past Surgical History:  Procedure Laterality Date  . CESAREAN SECTION    . cyst removed from wrist    . TONSILLECTOMY    . TUBAL LIGATION      Family History  Problem Relation Age of Onset  . Heart disease Mother   . Mental illness Mother   . Hypertension Mother   . Lung disease Father   . Heart disease Father   . Cancer Brother     Social History Social History   Tobacco Use  . Smoking  status: Current Every Day Smoker    Packs/day: 1.00    Types: Cigarettes  . Smokeless tobacco: Never Used  Substance Use Topics  . Alcohol use: Yes    Comment: occ  . Drug use: No    Allergies  Allergen Reactions  . Bee Venom Anaphylaxis and Hives  . Percocet [Oxycodone-Acetaminophen]     headache    Current Outpatient Medications  Medication Sig Dispense Refill  . albuterol (PROVENTIL HFA;VENTOLIN HFA) 108 (90 BASE) MCG/ACT inhaler Inhale 2 puffs into the lungs every 6 (six) hours as needed for wheezing or shortness of breath.    . Aspirin-Salicylamide-Caffeine (BC HEADACHE POWDER PO) Take 1 packet by mouth 4 (four) times daily as needed (headache).    Marland Kitchen atenolol (TENORMIN) 25 MG tablet Take 25 mg by mouth daily.    . cyclobenzaprine (FLEXERIL) 5 MG tablet Take 1 tablet (5 mg total) by mouth 3 (three) times daily as needed. for muscle spams 30 tablet 3  . EPINEPHrine (EPIPEN) 0.3 mg/0.3 mL IJ SOAJ injection Inject 0.3 mLs (0.3 mg total) into the muscle once. (Patient not taking: Reported on 04/05/2018) 1 Device 0  . famotidine (PEPCID) 20 MG tablet Take 1 tablet (20 mg total) by mouth 2 (two) times daily. (Patient not taking: Reported on 04/05/2018) 30 tablet 0  . HYDROcodone-acetaminophen (NORCO/VICODIN) 5-325 MG tablet One tablet every  four hours as needed for acute pain. 30 tablet 0  . ibuprofen (ADVIL,MOTRIN) 600 MG tablet Take 1 tablet (600 mg total) by mouth every 6 (six) hours as needed. (Patient not taking: Reported on 04/05/2018) 40 tablet 2   No current facility-administered medications for this visit.      Physical Exam  Blood pressure (!) 156/95, pulse (!) 102, height 5\' 8"  (1.727 m), weight 165 lb 8 oz (75.1 kg).  Constitutional: overall normal hygiene, normal nutrition, well developed, normal grooming, normal body habitus. Assistive device:none  Musculoskeletal: gait and station Limp none, muscle tone and strength are normal, no tremors or atrophy is present.  .   Neurological: coordination overall normal.  Deep tendon reflex/nerve stretch intact.  Sensation normal.  Cranial nerves II-XII intact.   Skin:   Normal overall no scars, lesions, ulcers or rashes. No psoriasis.  Psychiatric: Alert and oriented x 3.  Recent memory intact, remote memory unclear.  Normal mood and affect. Well groomed.  Good eye contact.  Cardiovascular: overall no swelling, no varicosities, no edema bilaterally, normal temperatures of the legs and arms, no clubbing, cyanosis and good capillary refill.  Lymphatic: palpation is normal.  Spine/Pelvis examination:  Inspection:  Overall, sacoiliac joint benign and hips nontender; without crepitus or defects.   Thoracic spine inspection: Alignment normal without kyphosis present   Lumbar spine inspection:  Alignment  with normal lumbar lordosis, without scoliosis apparent.   Thoracic spine palpation:  without tenderness of spinal processes   Lumbar spine palpation: without tenderness of lumbar area; without tightness of lumbar muscles    Range of Motion:   Lumbar flexion, forward flexion is normal without pain or tenderness    Lumbar extension is full without pain or tenderness   Left lateral bend is normal without pain or tenderness   Right lateral bend is normal without pain or tenderness   Straight leg raising is normal  Strength & tone: normal   Stability overall normal stability  All other systems reviewed and are negative   The patient has been educated about the nature of the problem(s) and counseled on treatment options.  The patient appeared to understand what I have discussed and is in agreement with it.  Encounter Diagnoses  Name Primary?  . 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 Cigarette nicotine dependence without complication     PLAN Call if any problems.  Precautions discussed.  Continue current medications.   Return to clinic 2 months   I have reviewed the [facility]  [place] Cont[place] Substance Reporting System web site prior to prescribing narcotic medicine for this patient.   Electronically Signed Darreld McleanWayne Camilah Spillman, MD 8/13/20208:51 AM

## 2019-01-02 ENCOUNTER — Other Ambulatory Visit: Payer: Self-pay | Admitting: Orthopaedic Surgery

## 2019-01-09 ENCOUNTER — Telehealth: Payer: Self-pay | Admitting: Radiology

## 2019-01-09 NOTE — Telephone Encounter (Signed)
No more

## 2019-01-09 NOTE — Telephone Encounter (Signed)
error 

## 2019-01-09 NOTE — Telephone Encounter (Signed)
Patient called and LM to request/follow up on this refill.

## 2019-03-15 ENCOUNTER — Ambulatory Visit: Payer: BC Managed Care – PPO | Admitting: Orthopaedic Surgery

## 2019-03-22 ENCOUNTER — Other Ambulatory Visit: Payer: Self-pay

## 2019-03-22 ENCOUNTER — Ambulatory Visit: Payer: BC Managed Care – PPO | Admitting: Orthopaedic Surgery

## 2019-03-22 ENCOUNTER — Encounter: Payer: Self-pay | Admitting: Orthopaedic Surgery

## 2019-03-22 VITALS — BP 146/128 | HR 102 | Temp 98.1°F | Ht 67.5 in | Wt 166.4 lb

## 2019-03-22 DIAGNOSIS — F17210 Nicotine dependence, cigarettes, uncomplicated: Secondary | ICD-10-CM | POA: Diagnosis not present

## 2019-03-22 DIAGNOSIS — M5441 Lumbago with sciatica, right side: Secondary | ICD-10-CM | POA: Diagnosis not present

## 2019-03-22 DIAGNOSIS — G8929 Other chronic pain: Secondary | ICD-10-CM | POA: Diagnosis not present

## 2019-03-22 MED ORDER — NAPROXEN 500 MG PO TABS: 500.0000 mg | ORAL_TABLET | Freq: Two times a day (BID) | ORAL | 5 refills | Status: DC

## 2019-03-22 NOTE — Progress Notes (Signed)
Patient IP:Nichole [name], female DOB:[birth year], 48 y.o. LZJ:[id number]  Chief Complaint  Patient presents with  . Back Pain    It is hurting bad    HPI  Nichole [name] is a 48 y.o. female who has chronic lower back pain.  It is worse with the cold weather.  She has had negative MRI and has been to PT.  She is taking Goody Powders.  I will change to Naprosyn.  She is to stop the [employer]. She is doing her exercises, she walks a lot.  She has no weakness or numbness.   Body mass index is 25.67 kg/m.  ROS  Review of Systems  Constitutional: Positive for activity change.  Respiratory: Positive for shortness of breath. Negative for cough.   Endocrine: Positive for cold intolerance.  Musculoskeletal: Positive for arthralgias, back pain, gait problem and myalgias.  Allergic/Immunologic: Positive for environmental allergies.  All other systems reviewed and are negative.   All other systems reviewed and are negative.  The following is a summary of the past history medically, past history surgically, known current medicines, social history and family history.  This information is gathered electronically by the computer from prior information and documentation.  I review this each visit and have found including this information at this point in the chart is beneficial and informative.    Past Medical History:  Diagnosis Date  . Arthritis   . Asthma   . Bipolar 1 disorder ([place])   . COPD (chronic obstructive pulmonary disease) ([place])   . COPD (chronic obstructive pulmonary disease) ([place])   . Heart attack ([place])   . Manic depression ([place])   . Panic attacks     Past Surgical History:  Procedure Laterality Date  . CESAREAN SECTION    . cyst removed from wrist    . TONSILLECTOMY    . TUBAL LIGATION      Family History  Problem Relation Age of Onset  . Heart disease Mother   . Mental illness Mother   . Hypertension Mother   . Lung disease Father   . Heart disease Father    . Cancer Brother     Social History Social History   Tobacco Use  . Smoking status: Current Every Day Smoker    Packs/day: 1.00    Types: Cigarettes  . Smokeless tobacco: Never Used  Substance Use Topics  . Alcohol use: Yes    Comment: occ  . Drug use: No    Allergies  Allergen Reactions  . Bee Venom Anaphylaxis and Hives  . Percocet [Oxycodone-Acetaminophen]     headache    Current Outpatient Medications  Medication Sig Dispense Refill  . albuterol (PROVENTIL HFA;VENTOLIN HFA) 108 (90 BASE) MCG/ACT inhaler Inhale 2 puffs into the lungs every 6 (six) hours as needed for wheezing or shortness of breath.    . Aspirin-Salicylamide-Caffeine (BC HEADACHE POWDER PO) Take 1 packet by mouth 4 (four) times daily as needed (headache).    Marland Kitchen atenolol (TENORMIN) 25 MG tablet Take 25 mg by mouth daily.    . cyclobenzaprine (FLEXERIL) 5 MG tablet Take 1 tablet (5 mg total) by mouth 3 (three) times daily as needed. for muscle spams 30 tablet 3  . EPINEPHrine (EPIPEN) 0.3 mg/0.3 mL IJ SOAJ injection Inject 0.3 mLs (0.3 mg total) into the muscle once. (Patient not taking: Reported on 04/05/2018) 1 Device 0  . famotidine (PEPCID) 20 MG tablet Take 1 tablet (20 mg total) by mouth 2 (two) times daily. (  Patient not taking: Reported on 04/05/2018) 30 tablet 0  . HYDROcodone-acetaminophen (NORCO/VICODIN) 5-325 MG tablet One tablet every four hours as needed for acute pain. 30 tablet 0  . ibuprofen (ADVIL,MOTRIN) 600 MG tablet Take 1 tablet (600 mg total) by mouth every 6 (six) hours as needed. (Patient not taking: Reported on 04/05/2018) 40 tablet 2  . naproxen (NAPROSYN) 500 MG tablet Take 1 tablet (500 mg total) by mouth 2 (two) times daily with a meal. 60 tablet 5   No current facility-administered medications for this visit.      Physical Exam  Blood pressure (!) 146/128, pulse (!) 102, temperature 98.1 F (36.7 C), height 5' 7.5" (1.715 m), weight 166 lb 6 oz (75.5 kg).  Constitutional:  overall normal hygiene, normal nutrition, well developed, normal grooming, normal body habitus. Assistive device:none  Musculoskeletal: gait and station Limp none, muscle tone and strength are normal, no tremors or atrophy is present.  .  Neurological: coordination overall normal.  Deep tendon reflex/nerve stretch intact.  Sensation normal.  Cranial nerves II-XII intact.   Skin:   Normal overall no scars, lesions, ulcers or rashes. No psoriasis.  Psychiatric: Alert and oriented x 3.  Recent memory intact, remote memory unclear.  Normal mood and affect. Well groomed.  Good eye contact.  Cardiovascular: overall no swelling, no varicosities, no edema bilaterally, normal temperatures of the legs and arms, no clubbing, cyanosis and good capillary refill.  Lymphatic: palpation is normal.  Spine/Pelvis examination:  Inspection:  Overall, sacoiliac joint benign and hips nontender; without crepitus or defects.   Thoracic spine inspection: Alignment normal without kyphosis present   Lumbar spine inspection:  Alignment  with normal lumbar lordosis, without scoliosis apparent.   Thoracic spine palpation:  without tenderness of spinal processes   Lumbar spine palpation: without tenderness of lumbar area; without tightness of lumbar muscles    Range of Motion:   Lumbar flexion, forward flexion is normal without pain or tenderness    Lumbar extension is full without pain or tenderness   Left lateral bend is normal without pain or tenderness   Right lateral bend is normal without pain or tenderness   Straight leg raising is normal  Strength & tone: normal   Stability overall normal stability  All other systems reviewed and are negative   The patient has been educated about the nature of the problem(s) and counseled on treatment options.  The patient appeared to understand what I have discussed and is in agreement with it.  Encounter Diagnoses  Name Primary?  . Chronic right-sided low back  pain with right-sided sciatica Yes  . Cigarette nicotine dependence without complication     PLAN Call if any problems.  Precautions discussed.  Begin Naprosyn.  Return to clinic 3 months   Electronically Signed Darreld Mclean, MD 11/19/20208:10 AM

## 2019-03-22 NOTE — Patient Instructions (Signed)

## 2019-06-20 ENCOUNTER — Other Ambulatory Visit: Payer: Self-pay

## 2019-06-20 ENCOUNTER — Encounter: Payer: Self-pay | Admitting: Orthopaedic Surgery

## 2019-06-20 ENCOUNTER — Ambulatory Visit: Payer: BC Managed Care – PPO | Admitting: Orthopaedic Surgery

## 2019-06-20 VITALS — Temp 98.6°F | Ht 68.0 in | Wt 168.1 lb

## 2019-06-20 DIAGNOSIS — M5441 Lumbago with sciatica, right side: Secondary | ICD-10-CM

## 2019-06-20 DIAGNOSIS — G8929 Other chronic pain: Secondary | ICD-10-CM

## 2019-06-20 DIAGNOSIS — F17210 Nicotine dependence, cigarettes, uncomplicated: Secondary | ICD-10-CM

## 2019-06-20 MED ORDER — HYDROCODONE-ACETAMINOPHEN 5-325 MG PO TABS: ORAL_TABLET | ORAL | 0 refills | Status: DC

## 2019-06-20 NOTE — Patient Instructions (Signed)

## 2019-06-20 NOTE — Progress Notes (Signed)
Patient FT:DDUKGU B Wangerin, female DOB:12-23-1970, 49 y.o. RKY:[id number]  Chief Complaint  Patient presents with  . Back Pain    hurting with pain going into both hips    HPI  ELTHA TINGLEY is a 49 y.o. female who has chronic lower back pain and some hip pain but the pain radiates to the hips.  She has no trauma, no weakness, no numbness.  She said the Naprosyn did not help.  She wants to go back to the [employer].  I told her she could but they could upset stomach more and cause more problems.  She wants to resume it.  She is doing her exercises.  She has more pain with the cold weather.   Body mass index is 25.56 kg/m.  ROS  Review of Systems  Constitutional: Positive for activity change.  Respiratory: Positive for shortness of breath. Negative for cough.   Endocrine: Positive for cold intolerance.  Musculoskeletal: Positive for arthralgias, back pain, gait problem and myalgias.  Allergic/Immunologic: Positive for environmental allergies.  All other systems reviewed and are negative.   All other systems reviewed and are negative.  The following is a summary of the past history medically, past history surgically, known current medicines, social history and family history.  This information is gathered electronically by the computer from prior information and documentation.  I review this each visit and have found including this information at this point in the chart is beneficial and informative.    Past Medical History:  Diagnosis Date  . Arthritis   . Asthma   . Bipolar 1 disorder ([facility])   . COPD (chronic obstructive pulmonary disease) ([facility])   . COPD (chronic obstructive pulmonary disease) ([facility])   . Heart attack ([facility])   . Manic depression ([facility])   . Panic attacks     Past Surgical History:  Procedure Laterality Date  . CESAREAN SECTION    . cyst removed from wrist    . TONSILLECTOMY    . TUBAL LIGATION      Family History  Problem Relation Age of Onset  .  Heart disease Mother   . Mental illness Mother   . Hypertension Mother   . Lung disease Father   . Heart disease Father   . Cancer Brother     Social History Social History   Tobacco Use  . Smoking status: Current Every Day Smoker    Packs/day: 1.00    Types: Cigarettes  . Smokeless tobacco: Never Used  Substance Use Topics  . Alcohol use: Yes    Comment: occ  . Drug use: No    Allergies  Allergen Reactions  . Bee Venom Anaphylaxis and Hives  . Percocet [Oxycodone-Acetaminophen]     headache    Current Outpatient Medications  Medication Sig Dispense Refill  . albuterol (PROVENTIL HFA;VENTOLIN HFA) 108 (90 BASE) MCG/ACT inhaler Inhale 2 puffs into the lungs every 6 (six) hours as needed for wheezing or shortness of breath.    . Aspirin-Salicylamide-Caffeine (BC HEADACHE POWDER PO) Take 1 packet by mouth 4 (four) times daily as needed (headache).    Marland Kitchen atenolol (TENORMIN) 25 MG tablet Take 25 mg by mouth daily.    . cyclobenzaprine (FLEXERIL) 5 MG tablet Take 1 tablet (5 mg total) by mouth 3 (three) times daily as needed. for muscle spams 30 tablet 3  . EPINEPHrine (EPIPEN) 0.3 mg/0.3 mL IJ SOAJ injection Inject 0.3 mLs (0.3 mg total) into the muscle once. (Patient not taking: Reported on  04/05/2018) 1 Device 0  . famotidine (PEPCID) 20 MG tablet Take 1 tablet (20 mg total) by mouth 2 (two) times daily. (Patient not taking: Reported on 04/05/2018) 30 tablet 0  . HYDROcodone-acetaminophen (NORCO/VICODIN) 5-325 MG tablet One tablet every four hours as needed for acute pain.  Limit of five days per [place] [facility]. 30 tablet 0  . ibuprofen (ADVIL,MOTRIN) 600 MG tablet Take 1 tablet (600 mg total) by mouth every 6 (six) hours as needed. (Patient not taking: Reported on 04/05/2018) 40 tablet 2  . naproxen (NAPROSYN) 500 MG tablet Take 1 tablet (500 mg total) by mouth 2 (two) times daily with a meal. 60 tablet 5   No current facility-administered medications for this visit.      Physical Exam  Temperature 98.6 F (37 C), height 5\' 8"  (1.727 m), weight 168 lb 2 oz (76.3 kg).  Constitutional: overall normal hygiene, normal nutrition, well developed, normal grooming, normal body habitus. Assistive device:none  Musculoskeletal: gait and station Limp none, muscle tone and strength are normal, no tremors or atrophy is present.  .  Neurological: coordination overall normal.  Deep tendon reflex/nerve stretch intact.  Sensation normal.  Cranial nerves II-XII intact.   Skin:   Normal overall no scars, lesions, ulcers or rashes. No psoriasis.  Psychiatric: Alert and oriented x 3.  Recent memory intact, remote memory unclear.  Normal mood and affect. Well groomed.  Good eye contact.  Cardiovascular: overall no swelling, no varicosities, no edema bilaterally, normal temperatures of the legs and arms, no clubbing, cyanosis and good capillary refill.  Lymphatic: palpation is normal.  Spine/Pelvis examination:  Inspection:  Overall, sacoiliac joint benign and hips nontender; without crepitus or defects.   Thoracic spine inspection: Alignment normal without kyphosis present   Lumbar spine inspection:  Alignment  with normal lumbar lordosis, without scoliosis apparent.   Thoracic spine palpation:  without tenderness of spinal processes   Lumbar spine palpation: without tenderness of lumbar area; without tightness of lumbar muscles    Range of Motion:   Lumbar flexion, forward flexion is normal without pain or tenderness    Lumbar extension is full without pain or tenderness   Left lateral bend is normal without pain or tenderness   Right lateral bend is normal without pain or tenderness   Straight leg raising is normal  Strength & tone: normal   Stability overall normal stability  All other systems reviewed and are negative   The patient has been educated about the nature of the problem(s) and counseled on treatment options.  The patient appeared to  understand what I have discussed and is in agreement with it.  Encounter Diagnoses  Name Primary?  . 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 Cigarette nicotine dependence without complication     PLAN Call if any problems.  Precautions discussed.  Continue current medications.   Return to clinic 3 months   I have reviewed the [place] web site prior to prescribing narcotic medicine for this patient.   Electronically Signed Sanjuana Kava, MD 2/17/20211:55 PM

## 2019-06-21 ENCOUNTER — Ambulatory Visit: Payer: BC Managed Care – PPO | Admitting: Orthopaedic Surgery

## 2019-07-29 IMAGING — DX DG LUMBAR SPINE COMPLETE 4+V
5 series · 5 of 5 positions shown · non-contrast
Comparison: None.

CLINICAL DATA: Right back pain radiating to right hip for 4 days.

EXAM:
LUMBAR SPINE - COMPLETE 4+ VIEW

[l-spine ap]
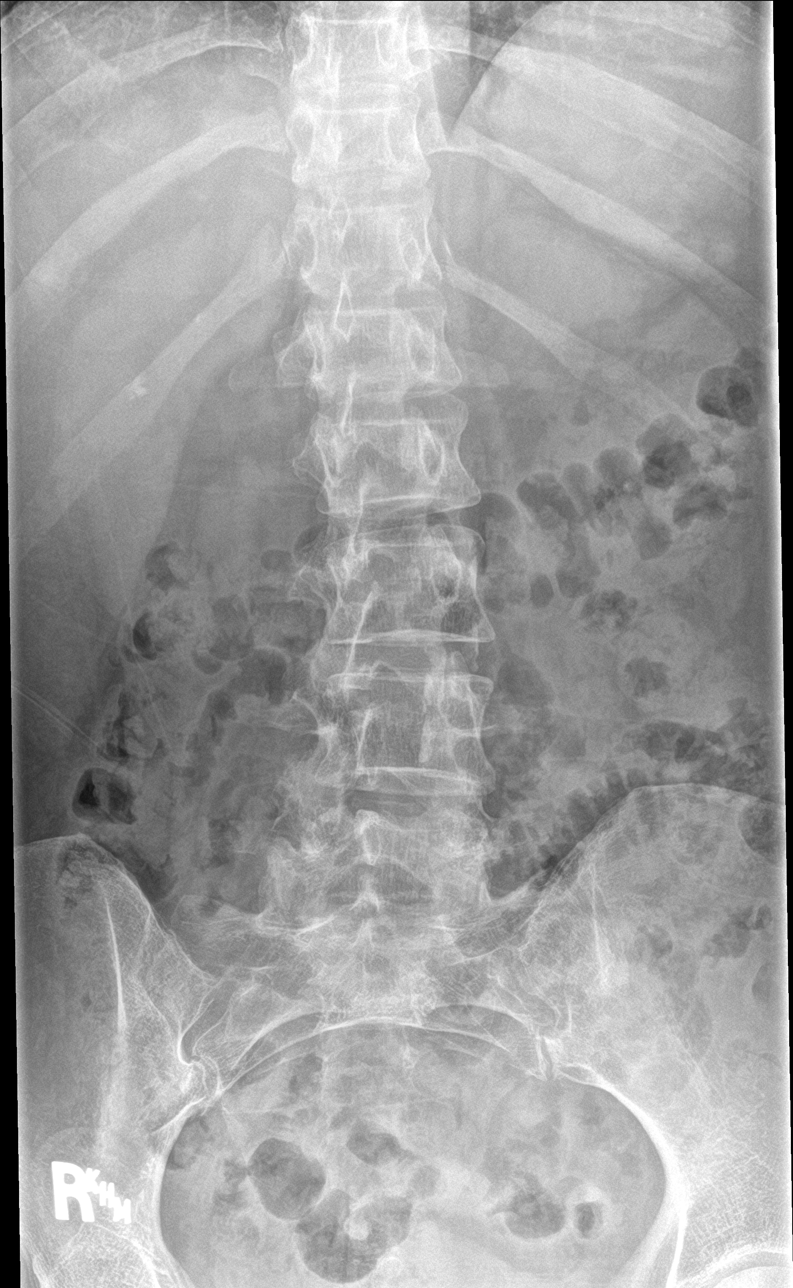

[l-spine obl (1 of 2)]
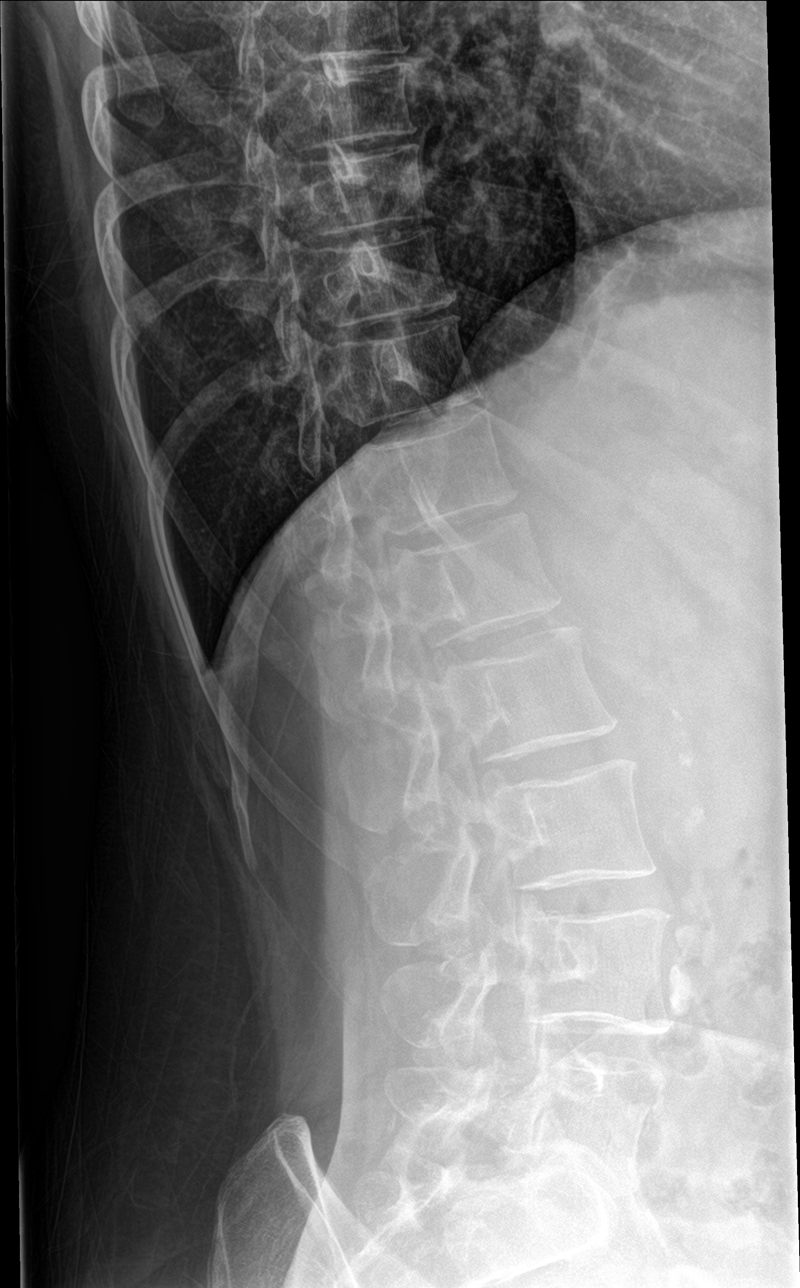

[l-spine obl (2 of 2)]
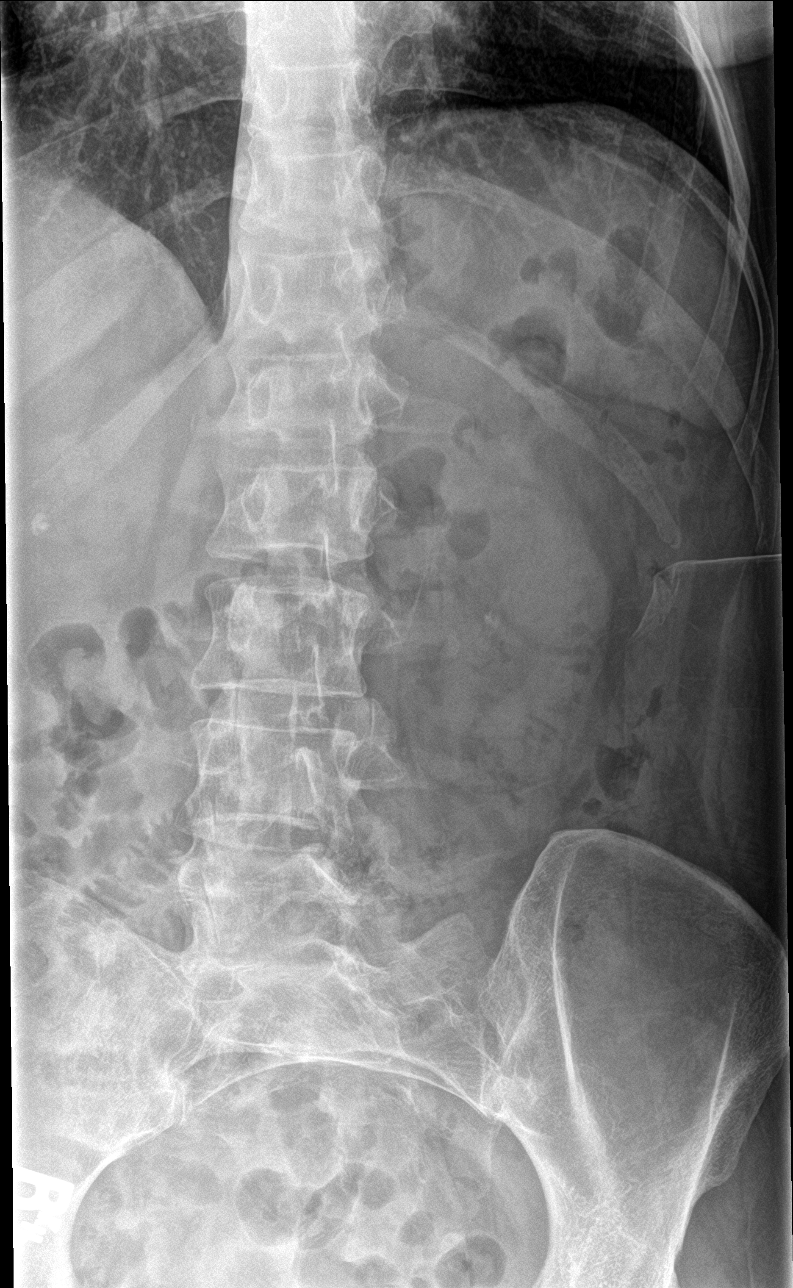

[l-spine lat]
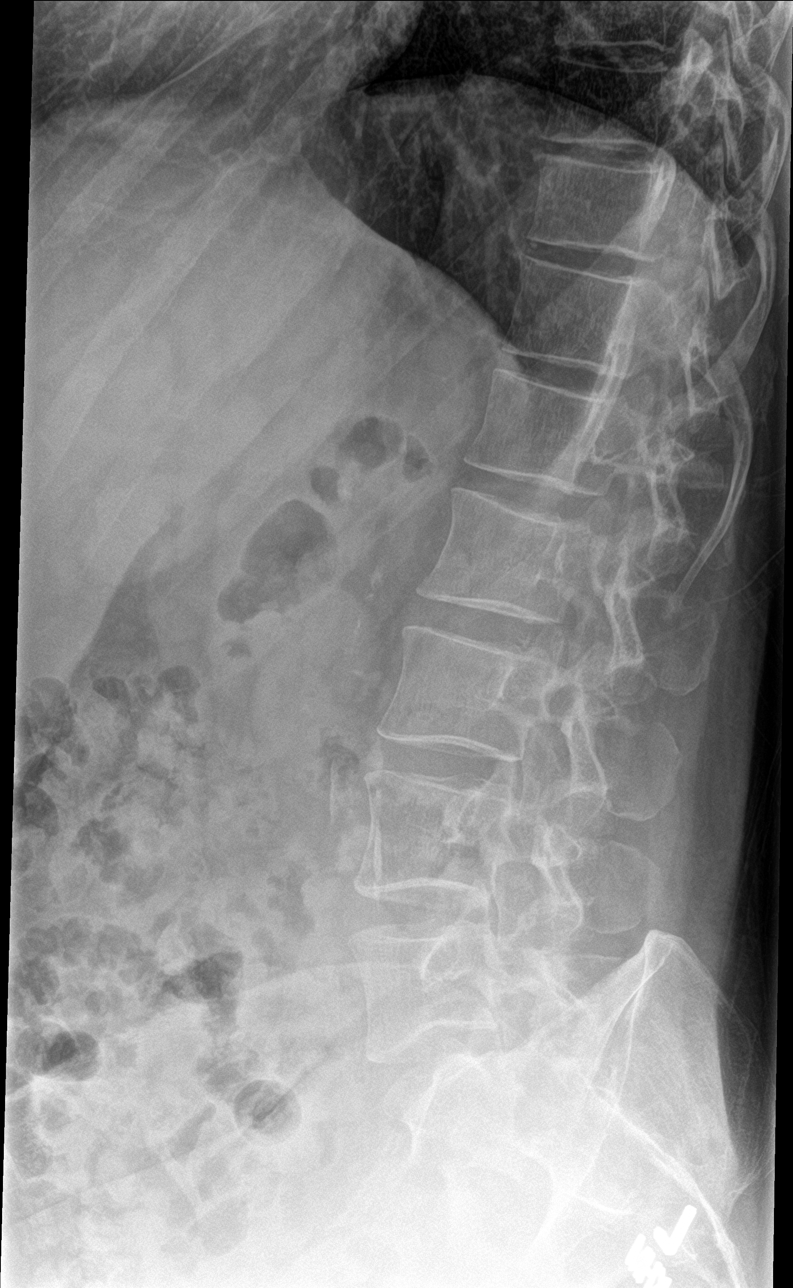

[l-spine spot]
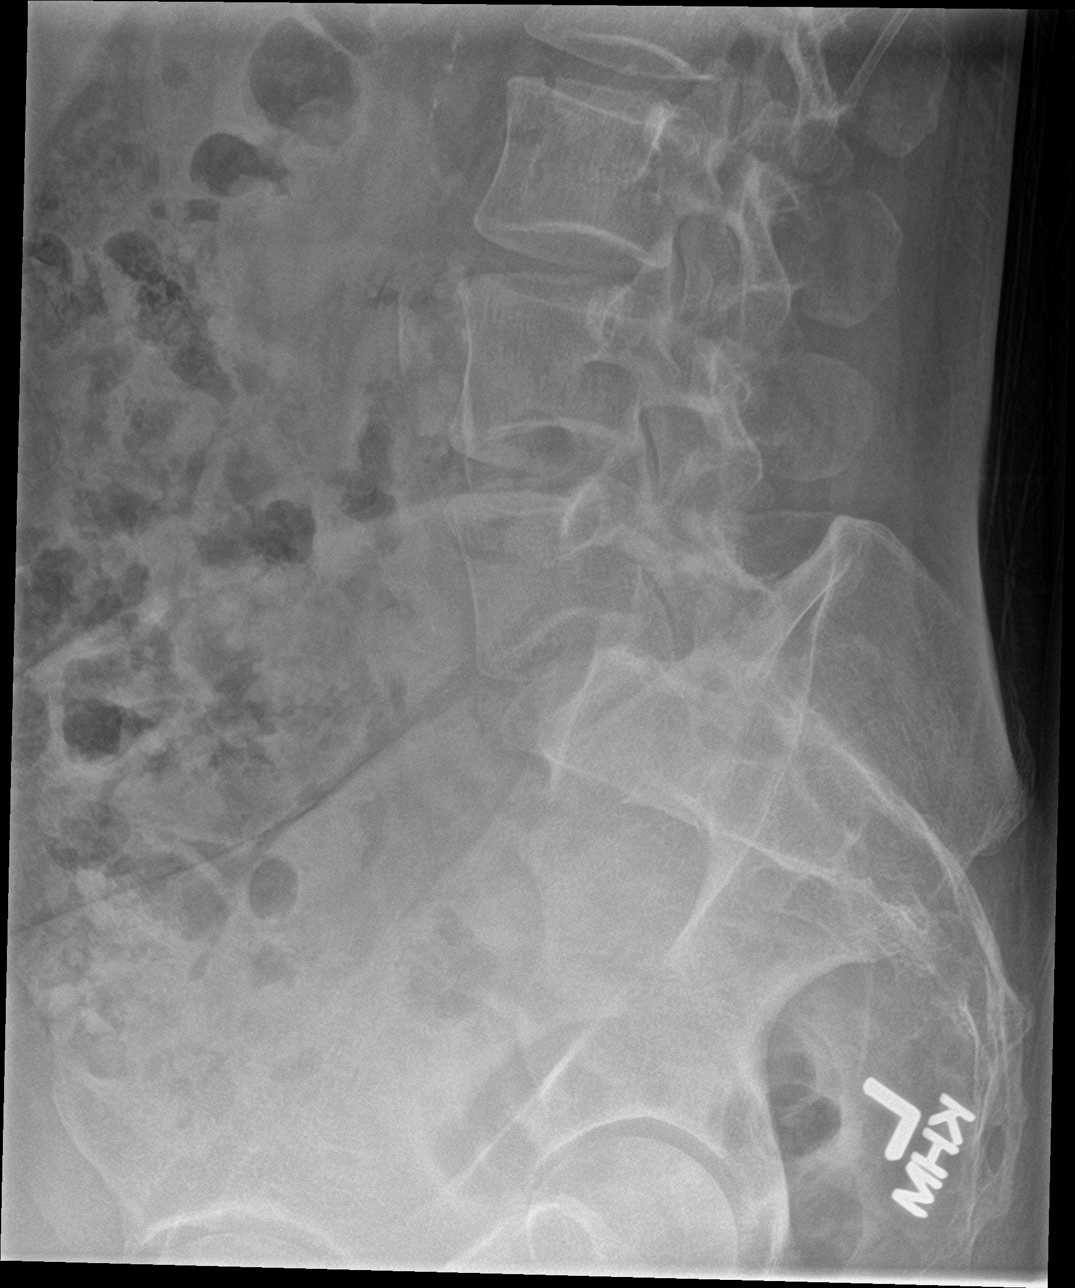

[5 of 5 positions shown; findings below may reference images not displayed]

FINDINGS: There is no evidence of lumbar spine fracture. Alignment is normal.
Intervertebral disc spaces are maintained. No significant facet
arthropathy or other bone lesions. Minimal lumbar levoscoliosis
noted.
IMPRESSION: Minimal lumbar levoscoliosis.  Otherwise negative.

## 2019-07-29 IMAGING — DX DG HIP (WITH OR WITHOUT PELVIS) 2-3V*R*
3 series · 3 of 3 positions shown · non-contrast
Comparison: None.

CLINICAL DATA: Right hip pain.

EXAM:
DG HIP (WITH OR WITHOUT PELVIS) 2-3V RIGHT

[pelvis ap]
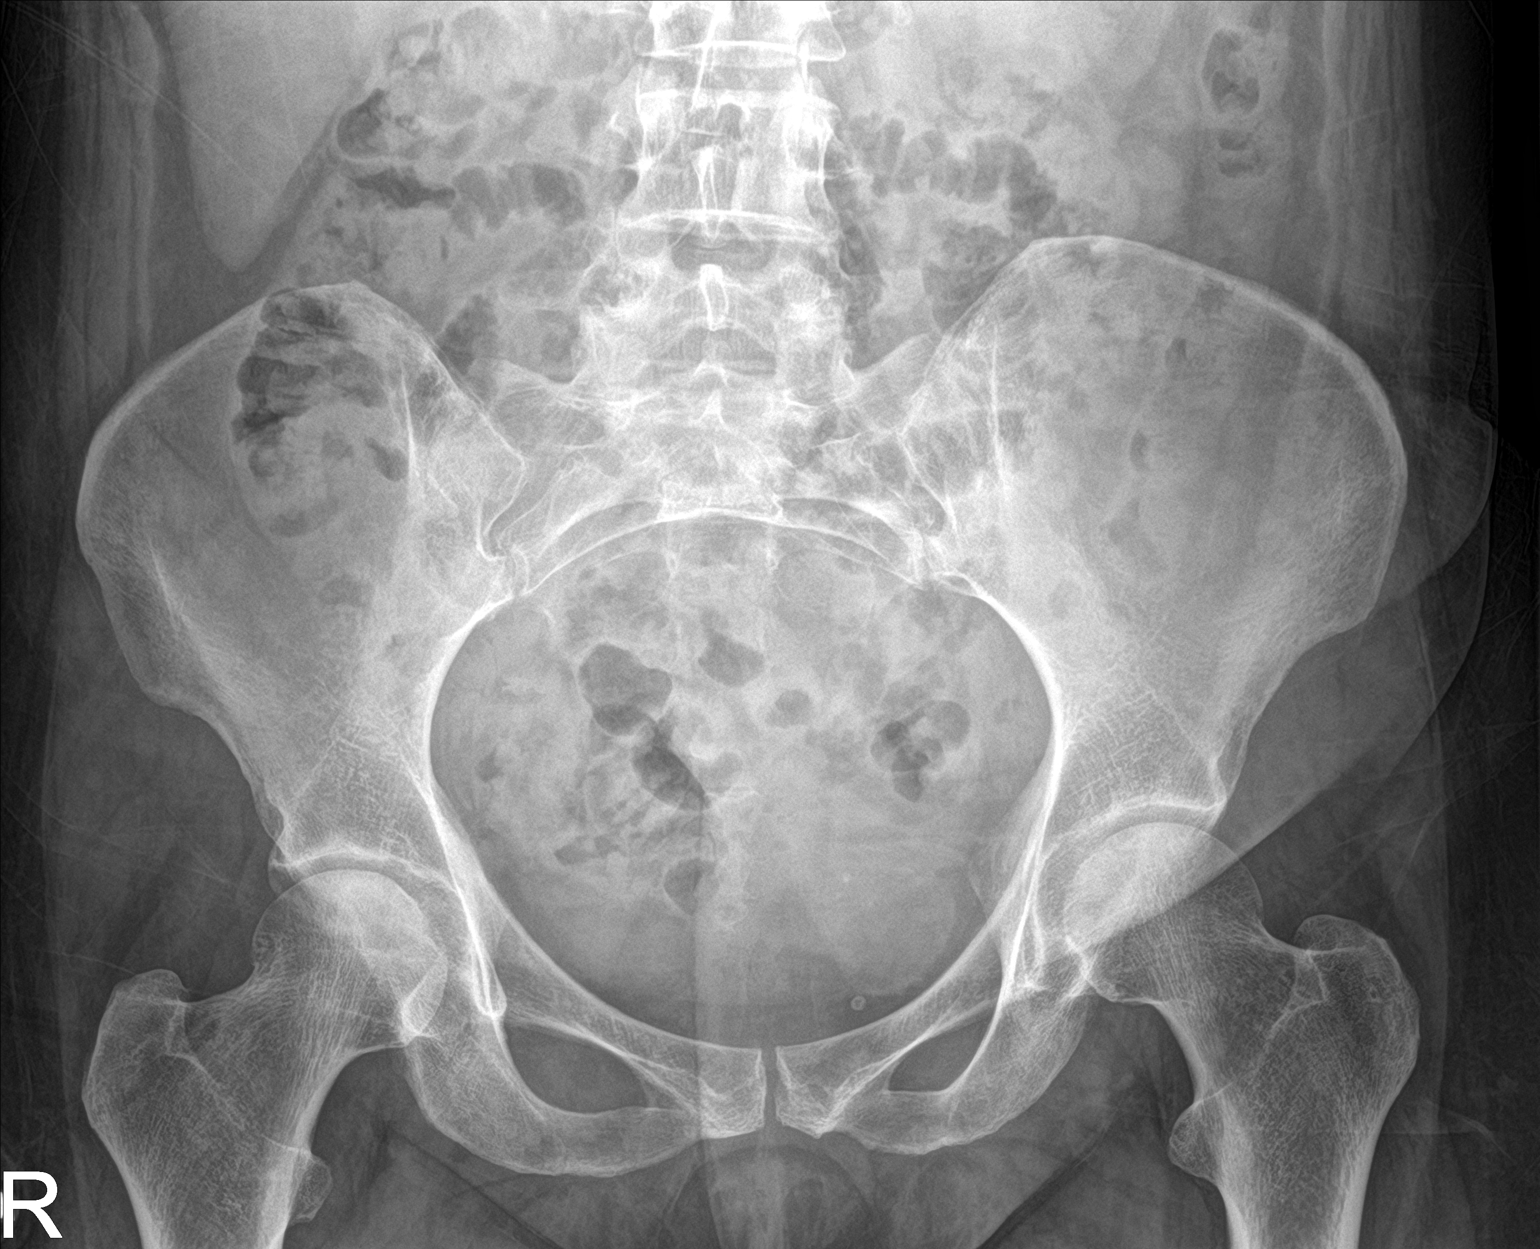

[hip ap]
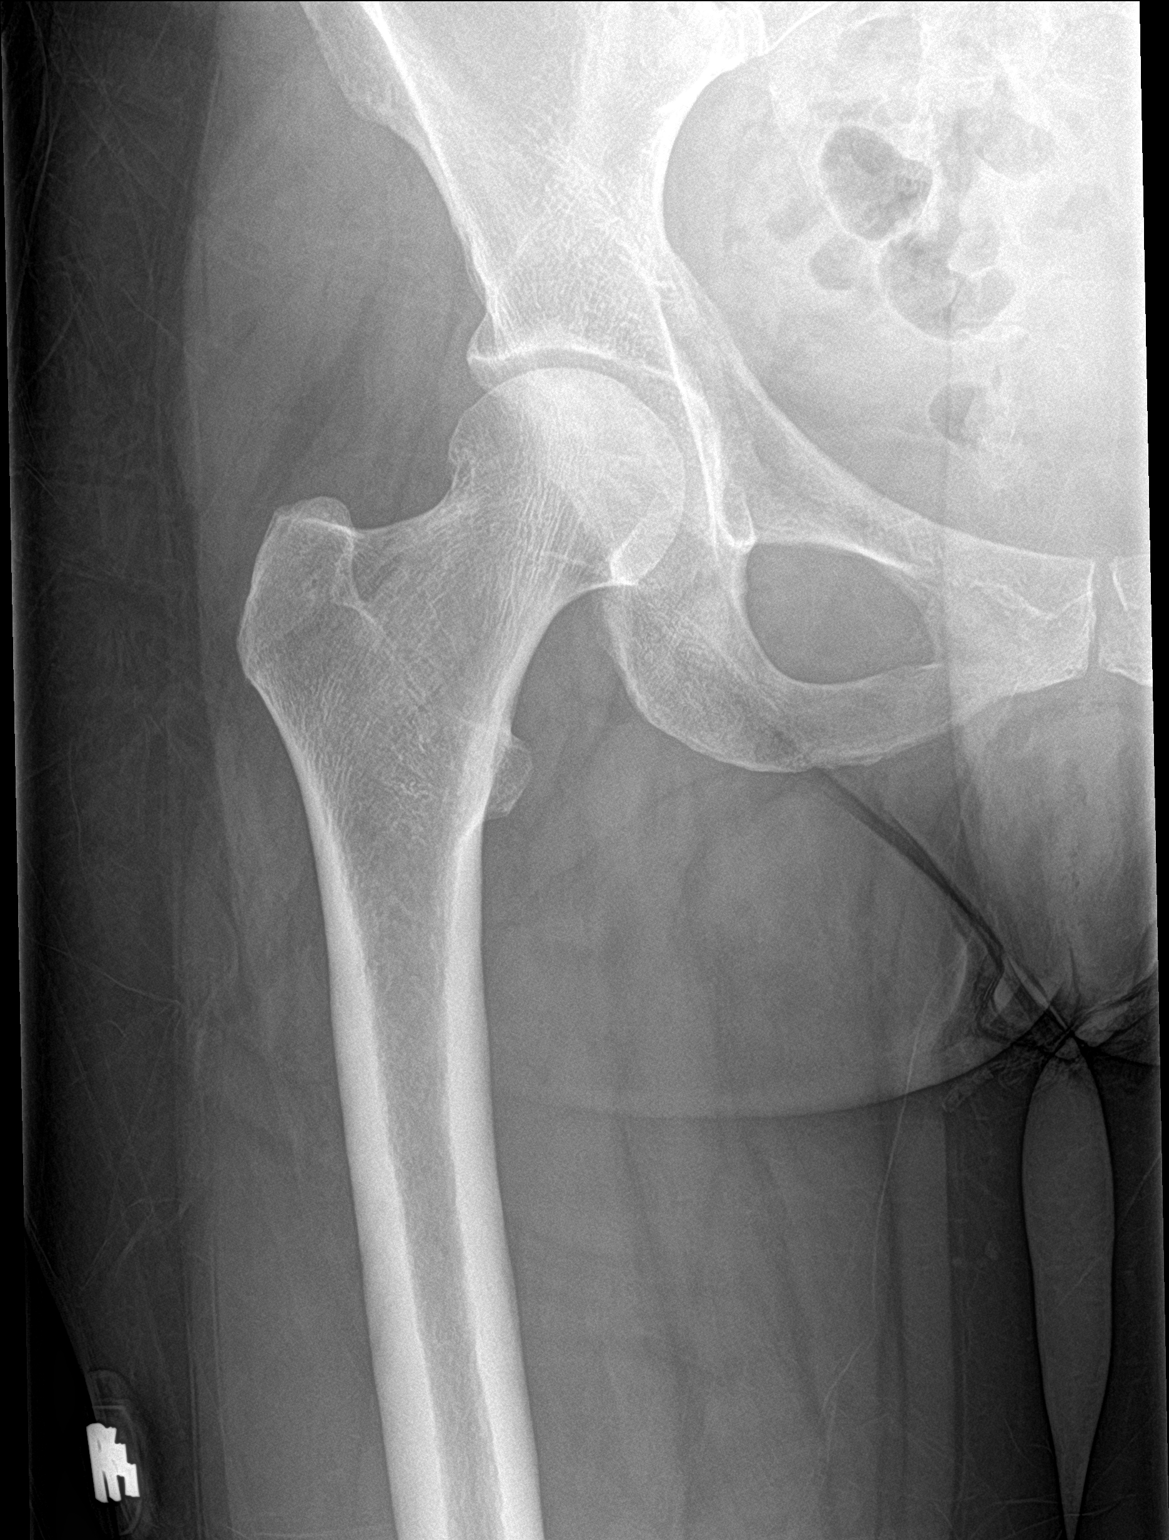

[hip lat]
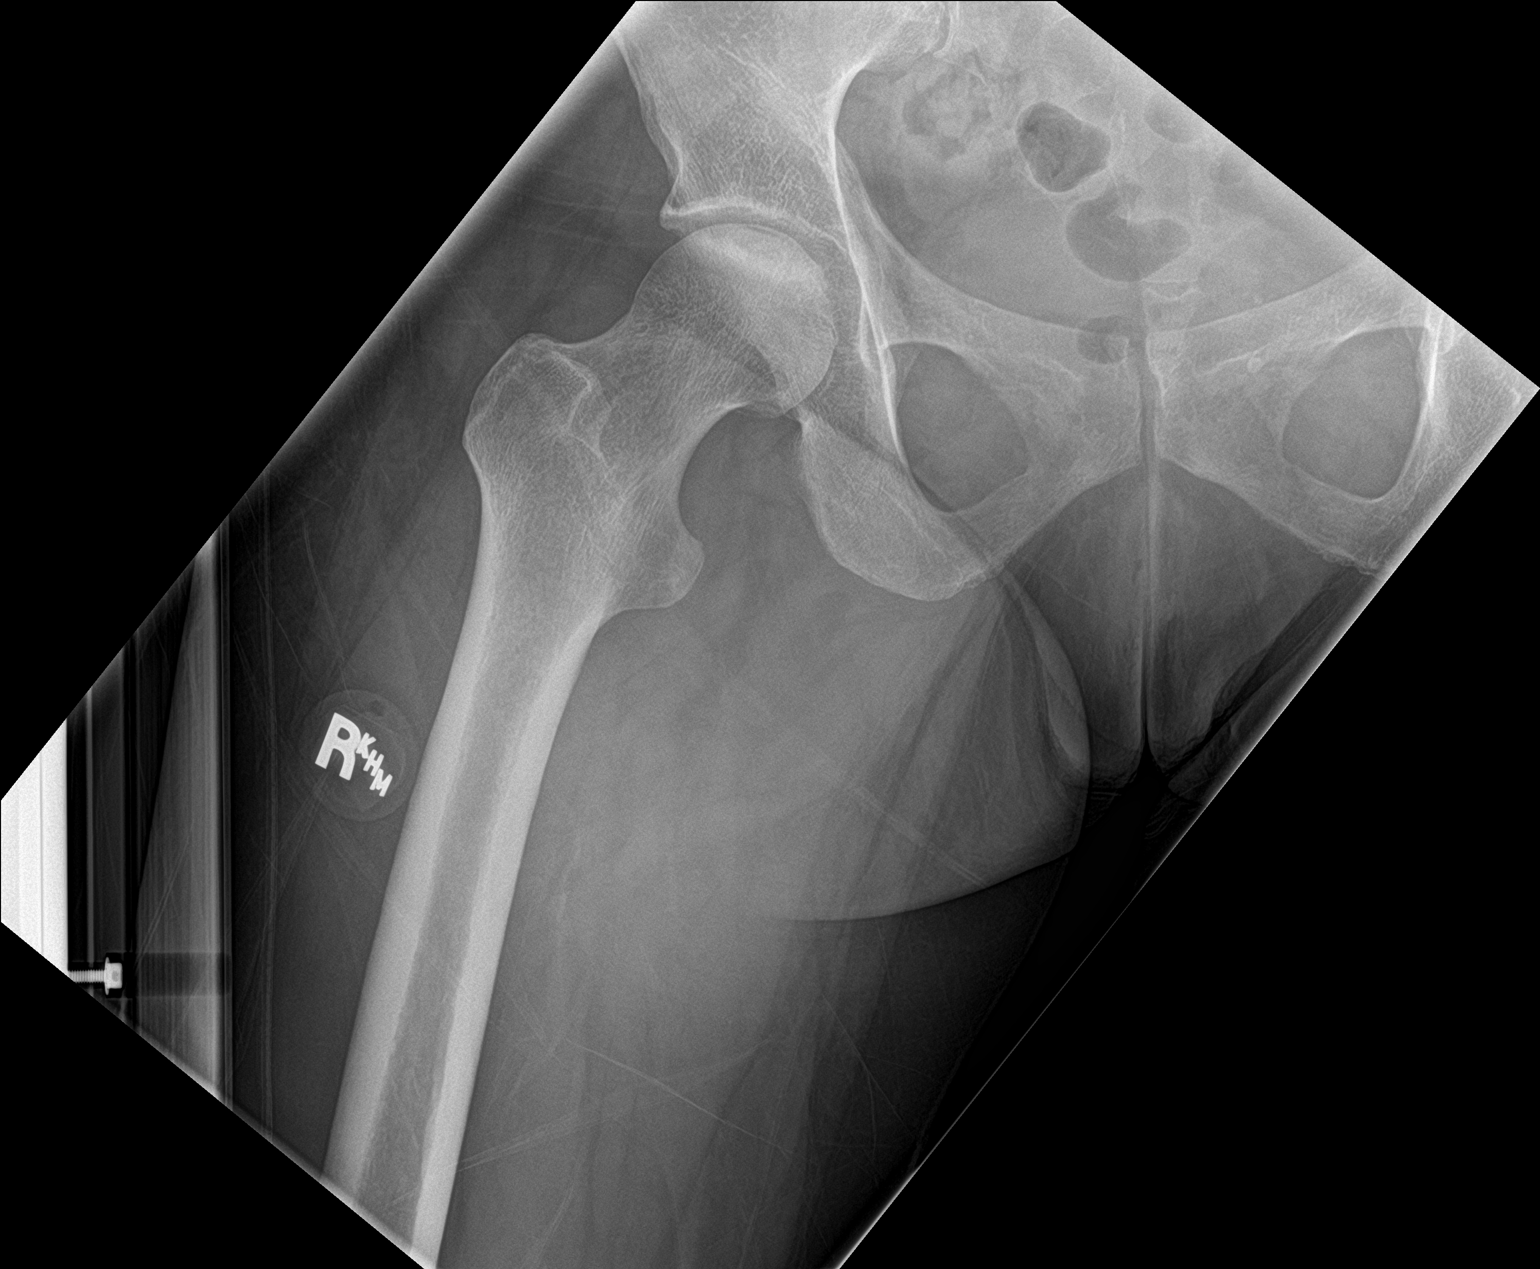

[3 of 3 positions shown; findings below may reference images not displayed]

FINDINGS: There is no evidence of hip fracture or dislocation. There is no
evidence of arthropathy or other focal bone abnormality.
IMPRESSION: Negative.

## 2019-07-31 ENCOUNTER — Emergency Department (HOSPITAL_COMMUNITY): Payer: BC Managed Care – PPO

## 2019-07-31 ENCOUNTER — Emergency Department (HOSPITAL_COMMUNITY)
Admission: EM | Admit: 2019-07-31 | Discharge: 2019-07-31 | Disposition: A | Discharge status: Home / Self Care | Payer: BC Managed Care – PPO | Attending: Emergency Medicine | Admitting: Emergency Medicine

## 2019-07-31 ENCOUNTER — Other Ambulatory Visit: Payer: Self-pay

## 2019-07-31 ENCOUNTER — Encounter (HOSPITAL_COMMUNITY): Payer: Self-pay | Admitting: Emergency Medicine

## 2019-07-31 DIAGNOSIS — Z79899 Other long term (current) drug therapy: Secondary | ICD-10-CM | POA: Diagnosis not present

## 2019-07-31 DIAGNOSIS — F17210 Nicotine dependence, cigarettes, uncomplicated: Secondary | ICD-10-CM | POA: Diagnosis not present

## 2019-07-31 DIAGNOSIS — R519 Headache, unspecified: Secondary | ICD-10-CM | POA: Diagnosis present

## 2019-07-31 DIAGNOSIS — J45909 Unspecified asthma, uncomplicated: Secondary | ICD-10-CM | POA: Insufficient documentation

## 2019-07-31 DIAGNOSIS — J449 Chronic obstructive pulmonary disease, unspecified: Secondary | ICD-10-CM | POA: Diagnosis not present

## 2019-07-31 MED ORDER — METOCLOPRAMIDE HCL 5 MG/ML IJ SOLN
10.0000 mg | Freq: Once | INTRAMUSCULAR | Status: AC
  Administered 2019-07-31: 10 mg via INTRAMUSCULAR
  Filled 2019-07-31: qty 2

## 2019-07-31 MED ORDER — DIPHENHYDRAMINE HCL 50 MG/ML IJ SOLN
25.0000 mg | Freq: Once | INTRAMUSCULAR | Status: AC
  Administered 2019-07-31: 25 mg via INTRAMUSCULAR
  Filled 2019-07-31: qty 1

## 2019-07-31 NOTE — Discharge Instructions (Addendum)
Use Tylenol Motrin as needed for pain.  Follow-up with a primary care doctor for further care and treatment as needed.

## 2019-07-31 NOTE — ED Provider Notes (Signed)
7:45 AM-checkout from Dr. Lajean Saver to evaluate head CT and status of headache post treatment.  Medications  metoCLOPramide (REGLAN) injection 10 mg (10 mg Intramuscular Given 07/31/19 0756)  diphenhydrAMINE (BENADRYL) injection 25 mg (25 mg Intramuscular Given 07/31/19 0754)     Patient Vitals for the past 24 hrs:  BP Temp Temp src Pulse Resp SpO2 Height Weight  07/31/19 0758 (!) 146/102 97.6 F (36.4 C) Oral 96 16 98 % -- --  07/31/19 0551 -- -- -- -- -- -- 5\' 8"  (1.727 m) 70.8 kg    8:56 AM Reevaluation with update and discussion. After initial assessment and treatment, an updated evaluation reveals she states her headache is better.  She has been trying to find a PCP but so far has not been able to find one "in my network."  Findings discussed with the patient and all questions were answered.   Medical Decision Making: Headache Nonspecific, possibly tension related.  No evidence for acute intracranial abnormality.  Reassuring clinical evaluation and response to medication.  No indication for further ED evaluation or treatment.  Nichole [name] was evaluated in Emergency Department on 07/31/2019 for the symptoms described in the history of present illness. She was evaluated in the context of the global COVID-19 pandemic, which necessitated consideration that the patient might be at risk for infection with the SARS-CoV-2 virus that causes COVID-19. Institutional protocols and algorithms that pertain to the evaluation of patients at risk for COVID-19 are in a state of rapid change based on information released by regulatory bodies including the CDC and federal and state organizations. These policies and algorithms were followed during the patient's care in the ED.   CRITICAL CARE-no Performed by: 08/02/2019  Nursing Notes Reviewed/ Care Coordinated Applicable Imaging Reviewed Interpretation of Laboratory Data incorporated into ED treatment  The patient appears reasonably  screened and/or stabilized for discharge and I doubt any other medical condition or other [facility] requiring further screening, evaluation, or treatment in the ED at this time prior to discharge.  Plan: Home Medications-OTC analgesia of choice, current medications continued; Home Treatments-rest, fluids; return here if the recommended treatment, does not improve the symptoms; Recommended follow up-PCP of choice, as needed    [facility], MD 07/31/19 [phone]

## 2019-07-31 NOTE — ED Provider Notes (Signed)
[facility] EMERGENCY DEPARTMENT Provider Note   CSN: [id number] Arrival date & time: 07/31/19  0531     History Chief Complaint  Patient presents with  . Headache    Nichole [name] is a 49 y.o. female.  Patient with a history of COPD, bipolar disorder, "migraine headaches" as well as chronic pain presenting with a diffuse headache onset 3 days ago.  She describes pain progressively worsening.  Started with a "stiffness" in her neck and gradually involved her entire head worse in the left side.  Denies thunderclap onset.  Denies fever.  Does have photophobia or phonophobia.  Headache is similar to previous migraines.  Took BC powders at home without relief.  She is not prescribed any headache medications.  She is never seen a neurologist.  Denies any focal weakness, numbness or tingling.  States she was told at other hospitals that she could not have any migraine treatment because she is a smoker.  Denies any blood thinner use.  Denies any blurred vision or double vision worse than baseline.  She denies any difficulty breathing or difficulty swallowing.  No difficulty speaking. She did have 2 episodes of nausea and vomiting this morning.  The history is provided by the patient and a relative.  Headache Associated symptoms: nausea, photophobia and vomiting   Associated symptoms: no congestion, no dizziness, no fever, no myalgias and no weakness        Past Medical History:  Diagnosis Date  . Arthritis   . Asthma   . Bipolar 1 disorder ([facility])   . COPD (chronic obstructive pulmonary disease) ([facility])   . COPD (chronic obstructive pulmonary disease) ([facility])   . Heart attack ([facility])   . Manic depression ([facility])   . Panic attacks     Patient Active Problem List   Diagnosis Date Noted  . Chest pain 04/03/2013  . Acute chest pain 04/03/2013  . Sciatica 07/02/2009  . BACK PAIN, CHRONIC 07/02/2009    Past Surgical History:  Procedure Laterality Date  . CESAREAN SECTION    . cyst removed  from wrist    . TONSILLECTOMY    . TUBAL LIGATION       OB History   No obstetric history on file.     Family History  Problem Relation Age of Onset  . Heart disease Mother   . Mental illness Mother   . Hypertension Mother   . Lung disease Father   . Heart disease Father   . Cancer Brother     Social History   Tobacco Use  . Smoking status: Current Every Day Smoker    Packs/day: 1.00    Types: Cigarettes  . Smokeless tobacco: Never Used  Substance Use Topics  . Alcohol use: Yes    Comment: occ  . Drug use: No    Home Medications Prior to Admission medications   Medication Sig Start Date End Date Taking? Authorizing Provider  albuterol (PROVENTIL HFA;VENTOLIN HFA) 108 (90 BASE) MCG/ACT inhaler Inhale 2 puffs into the lungs every 6 (six) hours as needed for wheezing or shortness of breath.    [provider]  Aspirin-Salicylamide-Caffeine (BC HEADACHE POWDER PO) Take 1 packet by mouth 4 (four) times daily as needed (headache).    [provider]  atenolol (TENORMIN) 25 MG tablet Take 25 mg by mouth daily.    [provider]  cyclobenzaprine (FLEXERIL) 5 MG tablet Take 1 tablet (5 mg total) by mouth 3 (three) times daily as needed. for muscle  spams 11/02/18   Darreld Mclean, MD  HYDROcodone-acetaminophen (NORCO/VICODIN) 5-325 MG tablet One tablet every four hours as needed for acute pain.  Limit of five days per [place] [facility]. 06/20/19   Darreld Mclean, MD    Allergies    Bee venom and Percocet [oxycodone-acetaminophen]  Review of Systems   Review of Systems  Constitutional: Positive for activity change. Negative for appetite change and fever.  HENT: Negative for congestion and rhinorrhea.   Eyes: Positive for photophobia. Negative for visual disturbance.  Respiratory: Negative for apnea, chest tightness and shortness of breath.   Cardiovascular: Negative for chest pain.  Gastrointestinal: Positive for nausea and vomiting.  Genitourinary:  Negative for dysuria.  Musculoskeletal: Negative for myalgias.  Neurological: Positive for headaches. Negative for dizziness, weakness and light-headedness.   all other systems are negative except as noted in the HPI and PMH.    Physical Exam Updated Vital Signs Ht 5\' 8"  (1.727 m)   Wt 70.8 kg   BMI 23.72 kg/m   Physical Exam Vitals and nursing note reviewed.  Constitutional:      General: She is not in acute distress.    Appearance: Normal appearance. She is well-developed and normal weight. She is not ill-appearing.     Comments: Disheveled.  HENT:     Head: Normocephalic and atraumatic.     Mouth/Throat:     Pharynx: No oropharyngeal exudate.     Comments: No frontal or maxillary sinus tenderness Eyes:     Conjunctiva/sclera: Conjunctivae normal.     Pupils: Pupils are equal, round, and reactive to light.  Neck:     Comments: No meningismus. Cardiovascular:     Rate and Rhythm: Normal rate and regular rhythm.     Heart sounds: Normal heart sounds. No murmur.  Pulmonary:     Effort: Pulmonary effort is normal. No respiratory distress.     Breath sounds: Normal breath sounds.  Abdominal:     Palpations: Abdomen is soft.     Tenderness: There is no abdominal tenderness. There is no guarding or rebound.  Musculoskeletal:        General: No tenderness. Normal range of motion.     Cervical back: Normal range of motion and neck supple.  Skin:    General: Skin is warm.  Neurological:     General: No focal deficit present.     Mental Status: She is alert and oriented to person, place, and time. Mental status is at baseline.     Cranial Nerves: No cranial nerve deficit.     Motor: No abnormal muscle tone.     Coordination: Coordination normal.     Comments: No ataxia on finger to nose bilaterally. No pronator drift. 5/5 strength throughout. CN 2-12 intact.Equal grip strength. Sensation intact.   Psychiatric:        Behavior: Behavior normal.     ED Results / Procedures  / Treatments   Labs (all labs ordered are listed, but only abnormal results are displayed) Labs Reviewed - No data to display  EKG None  Radiology No results found.  Procedures Procedures (including critical care time)  Medications Ordered in ED Medications  metoCLOPramide (REGLAN) injection 10 mg (has no administration in time range)  diphenhydrAMINE (BENADRYL) injection 25 mg (has no administration in time range)    ED Course  I have reviewed the triage vital signs and the nursing notes.  Pertinent labs & imaging results that were available during my care of the patient were reviewed  by me and considered in my medical decision making (see chart for details).    MDM Rules/Calculators/A&P                     Headache x3 days with nausea vomiting, photophobia phonophobia similar to previous migraines.  Neurologically intact.  Denies thunderclap onset.  No fever.  Low suspicion for subarachnoid hemorrhage, meningitis, temporal arteritis.  Patient will be given Benadryl as well as Reglan.  She has no previous imaging of her brain so CT scan will be obtained.  Anticipate giving Toradol once her CT scan is resulted.  Medications and CT scan pending at shift change.  Dr. Eulis Foster to assume care.  Patient will be given referrals to PCP as well as neurology.   Final Clinical Impression(s) / ED Diagnoses Final diagnoses:  Bad headache    Rx / DC Orders ED Discharge Orders    None       Kiosha Buchan, Annie Main, MD 07/31/19 [phone]

## 2019-07-31 NOTE — ED Triage Notes (Signed)
Pt c/o headache x 3 days. Pt states she has been taking tension headache meds.

## 2019-09-20 ENCOUNTER — Other Ambulatory Visit: Payer: Self-pay

## 2019-09-20 ENCOUNTER — Ambulatory Visit (INDEPENDENT_AMBULATORY_CARE_PROVIDER_SITE_OTHER): Payer: BC Managed Care – PPO | Admitting: Orthopaedic Surgery

## 2019-09-20 ENCOUNTER — Encounter: Payer: Self-pay | Admitting: Orthopaedic Surgery

## 2019-09-20 VITALS — BP 119/87 | HR 113 | Ht 68.0 in | Wt 161.0 lb

## 2019-09-20 DIAGNOSIS — G8929 Other chronic pain: Secondary | ICD-10-CM | POA: Diagnosis not present

## 2019-09-20 DIAGNOSIS — M5441 Lumbago with sciatica, right side: Secondary | ICD-10-CM

## 2019-09-20 DIAGNOSIS — F17210 Nicotine dependence, cigarettes, uncomplicated: Secondary | ICD-10-CM

## 2019-09-20 MED ORDER — HYDROCODONE-ACETAMINOPHEN 5-325 MG PO TABS: ORAL_TABLET | ORAL | 0 refills | Status: DC

## 2019-09-20 NOTE — Patient Instructions (Signed)
Steps to Quit Smoking Smoking tobacco is the leading cause of preventable death. It can affect almost every organ in the body. Smoking puts you and people around you at risk for many serious, long-lasting (chronic) diseases. Quitting smoking can be hard, but it is one of the best things that you can do for your health. It is never too late to quit. How do I get ready to quit? When you decide to quit smoking, make a plan to help you succeed. Before you quit:  Pick a date to quit. Set a date within the next 2 weeks to give you time to prepare.  Write down the reasons why you are quitting. Keep this list in places where you will see it often.  Tell your family, friends, and co-workers that you are quitting. Their support is important.  Talk with your doctor about the choices that may help you quit.  Find out if your health insurance will pay for these treatments.  Know the people, places, things, and activities that make you want to smoke (triggers). Avoid them. What first steps can I take to quit smoking?  Throw away all cigarettes at home, at work, and in your car.  Throw away the things that you use when you smoke, such as ashtrays and lighters.  Clean your car. Make sure to empty the ashtray.  Clean your home, including curtains and carpets. What can I do to help me quit smoking? Talk with your doctor about taking medicines and seeing a counselor at the same time. You are more likely to succeed when you do both.  If you are pregnant or breastfeeding, talk with your doctor about counseling or other ways to quit smoking. Do not take medicine to help you quit smoking unless your doctor tells you to do so. To quit smoking: Quit right away  Quit smoking totally, instead of slowly cutting back on how much you smoke over a period of time.  Go to counseling. You are more likely to quit if you go to counseling sessions regularly. Take medicine You may take medicines to help you quit. Some  medicines need a prescription, and some you can buy over-the-counter. Some medicines may contain a drug called nicotine to replace the nicotine in cigarettes. Medicines may:  Help you to stop having the desire to smoke (cravings).  Help to stop the problems that come when you stop smoking (withdrawal symptoms). Your doctor may ask you to use:  Nicotine patches, gum, or lozenges.  Nicotine inhalers or sprays.  Non-nicotine medicine that is taken by mouth. Find resources Find resources and other ways to help you quit smoking and remain smoke-free after you quit. These resources are most helpful when you use them often. They include:  Online chats with a counselor.  Phone quitlines.  Printed self-help materials.  Support groups or group counseling.  Text messaging programs.  Mobile phone apps. Use apps on your mobile phone or tablet that can help you stick to your quit plan. There are many free apps for mobile phones and tablets as well as websites. Examples include Quit Guide from the CDC and smokefree.gov  What things can I do to make it easier to quit?   Talk to your family and friends. Ask them to support and encourage you.  Call a phone quitline (1-800-QUIT-NOW), reach out to support groups, or work with a counselor.  Ask people who smoke to not smoke around you.  Avoid places that make you want to smoke,   such as: ? Bars. ? Parties. ? Smoke-break areas at work.  Spend time with people who do not smoke.  Lower the stress in your life. Stress can make you want to smoke. Try these things to help your stress: ? Getting regular exercise. ? Doing deep-breathing exercises. ? Doing yoga. ? Meditating. ? Doing a body scan. To do this, close your eyes, focus on one area of your body at a time from head to toe. Notice which parts of your body are tense. Try to relax the muscles in those areas. How will I feel when I quit smoking? Day 1 to 3 weeks Within the first 24 hours,  you may start to have some problems that come from quitting tobacco. These problems are very bad 2-3 days after you quit, but they do not often last for more than 2-3 weeks. You may get these symptoms:  Mood swings.  Feeling restless, nervous, angry, or annoyed.  Trouble concentrating.  Dizziness.  Strong desire for high-sugar foods and nicotine.  Weight gain.  Trouble pooping (constipation).  Feeling like you may vomit (nausea).  Coughing or a sore throat.  Changes in how the medicines that you take for other issues work in your body.  Depression.  Trouble sleeping (insomnia). Week 3 and afterward After the first 2-3 weeks of quitting, you may start to notice more positive results, such as:  Better sense of smell and taste.  Less coughing and sore throat.  Slower heart rate.  Lower blood pressure.  Clearer skin.  Better breathing.  Fewer sick days. Quitting smoking can be hard. Do not give up if you fail the first time. Some people need to try a few times before they succeed. Do your best to stick to your quit plan, and talk with your doctor if you have any questions or concerns. Summary  Smoking tobacco is the leading cause of preventable death. Quitting smoking can be hard, but it is one of the best things that you can do for your health.  When you decide to quit smoking, make a plan to help you succeed.  Quit smoking right away, not slowly over a period of time.  When you start quitting, seek help from your doctor, family, or friends. This information is not intended to replace advice given to you by your health care provider. Make sure you discuss any questions you have with your health care provider. Document Revised: 01/12/2019 Document Reviewed: 07/08/2018 Elsevier Patient Education  2020 [employer] Inc.  

## 2019-09-20 NOTE — Progress Notes (Signed)
Patient Nichole [name], female DOB:06-20-1970, 49 y.o. IBB:[id number]  Chief Complaint  Patient presents with  . Back Pain    HPI  Nichole [name] is a 49 y.o. female who has lower back pain. She has more pain with activity. She has good and bad days. She has no weakness. She has no new trauma.  She is being active and doing her exercises.   Body mass index is 24.48 kg/m.  ROS  Review of Systems  Constitutional: Positive for activity change.  Respiratory: Positive for shortness of breath. Negative for cough.   Endocrine: Positive for cold intolerance.  Musculoskeletal: Positive for arthralgias, back pain, gait problem and myalgias.  Allergic/Immunologic: Positive for environmental allergies.  All other systems reviewed and are negative.   All other systems reviewed and are negative.  The following is a summary of the past history medically, past history surgically, known current medicines, social history and family history.  This information is gathered electronically by the computer from prior information and documentation.  I review this each visit and have found including this information at this point in the chart is beneficial and informative.    Past Medical History:  Diagnosis Date  . Arthritis   . Asthma   . Bipolar 1 disorder (HCC)   . COPD (chronic obstructive pulmonary disease) (HCC)   . COPD (chronic obstructive pulmonary disease) (HCC)   . Heart attack (HCC)   . Manic depression (HCC)   . Panic attacks     Past Surgical History:  Procedure Laterality Date  . CESAREAN SECTION    . cyst removed from wrist    . TONSILLECTOMY    . TUBAL LIGATION      Family History  Problem Relation Age of Onset  . Heart disease Mother   . Mental illness Mother   . Hypertension Mother   . Lung disease Father   . Heart disease Father   . Cancer Brother     Social History Social History   Tobacco Use  . Smoking status: Current Every Day Smoker    Packs/day:  1.00    Types: Cigarettes  . Smokeless tobacco: Never Used  Substance Use Topics  . Alcohol use: Yes    Comment: occ  . Drug use: No    Allergies  Allergen Reactions  . Bee Venom Anaphylaxis and Hives  . Percocet [Oxycodone-Acetaminophen]     headache    Current Outpatient Medications  Medication Sig Dispense Refill  . albuterol (PROVENTIL HFA;VENTOLIN HFA) 108 (90 BASE) MCG/ACT inhaler Inhale 2 puffs into the lungs every 6 (six) hours as needed for wheezing or shortness of breath.    . Aspirin-Salicylamide-Caffeine (BC HEADACHE POWDER PO) Take 1 packet by mouth 4 (four) times daily as needed (headache).    Marland Kitchen atenolol (TENORMIN) 25 MG tablet Take 25 mg by mouth daily.    . cyclobenzaprine (FLEXERIL) 5 MG tablet Take 1 tablet (5 mg total) by mouth 3 (three) times daily as needed. for muscle spams (Patient not taking: Reported on 09/20/2019) 30 tablet 3  . HYDROcodone-acetaminophen (NORCO/VICODIN) 5-325 MG tablet One tablet every four hours as needed for acute pain.  Limit of five days per [place] statue. 30 tablet 0   No current facility-administered medications for this visit.     Physical Exam  Blood pressure 119/87, pulse (!) 113, height 5\' 8"  (1.727 m), weight 161 lb (73 kg).  Constitutional: overall normal hygiene, normal nutrition, well developed, normal grooming, normal body habitus.  Assistive device:none  Musculoskeletal: gait and station Limp none, muscle tone and strength are normal, no tremors or atrophy is present.  .  Neurological: coordination overall normal.  Deep tendon reflex/nerve stretch intact.  Sensation normal.  Cranial nerves II-XII intact.   Skin:   Normal overall no scars, lesions, ulcers or rashes. No psoriasis.  Psychiatric: Alert and oriented x 3.  Recent memory intact, remote memory unclear.  Normal mood and affect. Well groomed.  Good eye contact.  Cardiovascular: overall no swelling, no varicosities, no edema bilaterally, normal temperatures of  the legs and arms, no clubbing, cyanosis and good capillary refill.  Spine/Pelvis examination:  Inspection:  Overall, sacoiliac joint benign and hips nontender; without crepitus or defects.   Thoracic spine inspection: Alignment normal without kyphosis present   Lumbar spine inspection:  Alignment  with normal lumbar lordosis, without scoliosis apparent.   Thoracic spine palpation:  without tenderness of spinal processes   Lumbar spine palpation: without tenderness of lumbar area; without tightness of lumbar muscles    Range of Motion:   Lumbar flexion, forward flexion is normal without pain or tenderness    Lumbar extension is full without pain or tenderness   Left lateral bend is normal without pain or tenderness   Right lateral bend is normal without pain or tenderness   Straight leg raising is normal  Strength & tone: normal   Stability overall normal stability  Lymphatic: palpation is normal.  All other systems reviewed and are negative   The patient has been educated about the nature of the problem(s) and counseled on treatment options.  The patient appeared to understand what I have discussed and is in agreement with it.  Encounter Diagnoses  Name Primary?  . 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 Cigarette nicotine dependence without complication     PLAN Call if any problems.  Precautions discussed.  Continue current medications.   Return to clinic 3 months   I have reviewed the [place] web site prior to prescribing narcotic medicine for this patient.   Electronically Signed Sanjuana Kava, MD 5/20/20218:28 AM

## 2019-12-27 ENCOUNTER — Ambulatory Visit: Payer: BC Managed Care – PPO | Admitting: Orthopaedic Surgery

## 2020-01-01 ENCOUNTER — Encounter: Payer: Self-pay | Admitting: Orthopaedic Surgery

## 2020-01-01 ENCOUNTER — Other Ambulatory Visit: Payer: Self-pay

## 2020-01-01 ENCOUNTER — Ambulatory Visit (INDEPENDENT_AMBULATORY_CARE_PROVIDER_SITE_OTHER): Payer: Self-pay | Admitting: Orthopaedic Surgery

## 2020-01-01 VITALS — BP 126/90 | HR 103 | Ht 68.0 in | Wt 168.0 lb

## 2020-01-01 DIAGNOSIS — G8929 Other chronic pain: Secondary | ICD-10-CM

## 2020-01-01 DIAGNOSIS — M5441 Lumbago with sciatica, right side: Secondary | ICD-10-CM

## 2020-01-01 DIAGNOSIS — F17210 Nicotine dependence, cigarettes, uncomplicated: Secondary | ICD-10-CM

## 2020-01-01 MED ORDER — CYCLOBENZAPRINE HCL 5 MG PO TABS: 5.0000 mg | ORAL_TABLET | Freq: Three times a day (TID) | ORAL | 3 refills | Status: DC | PRN

## 2020-01-01 NOTE — Progress Notes (Signed)
Patient PI:Nichole [name], female DOB:11/01/1970, 49 y.o. [id number]  Chief Complaint  Patient presents with   Back Pain    HPI  Nichole [name] is a 49 y.o. female who has chronic lower back pain.  She had more pain about two weeks ago, localized to the lower back.  She took her medicine and did her exercises.  She has no new trauma, no weakness.   Body mass index is 25.54 kg/m.  ROS  Review of Systems  Constitutional: Positive for activity change.  Respiratory: Positive for shortness of breath. Negative for cough.   Endocrine: Positive for cold intolerance.  Musculoskeletal: Positive for arthralgias, back pain, gait problem and myalgias.  Allergic/Immunologic: Positive for environmental allergies.  All other systems reviewed and are negative.   All other systems reviewed and are negative.  The following is a summary of the past history medically, past history surgically, known current medicines, social history and family history.  This information is gathered electronically by the computer from prior information and documentation.  I review this each visit and have found including this information at this point in the chart is beneficial and informative.    Past Medical History:  Diagnosis Date   Arthritis    Asthma    Bipolar 1 disorder (HCC)    COPD (chronic obstructive pulmonary disease) (HCC)    COPD (chronic obstructive pulmonary disease) (HCC)    Heart attack (HCC)    Manic depression (HCC)    Panic attacks     Past Surgical History:  Procedure Laterality Date   CESAREAN SECTION     cyst removed from wrist     TONSILLECTOMY     TUBAL LIGATION      Family History  Problem Relation Age of Onset   Heart disease Mother    Mental illness Mother    Hypertension Mother    Lung disease Father    Heart disease Father    Cancer Brother     Social History Social History   Tobacco Use   Smoking status: Current Every Day Smoker     Packs/day: 1.00    Types: Cigarettes   Smokeless tobacco: Never Used  Substance Use Topics   Alcohol use: Yes    Comment: occ   Drug use: No    Allergies  Allergen Reactions   Bee Venom Anaphylaxis and Hives   Percocet [Oxycodone-Acetaminophen]     headache    Current Outpatient Medications  Medication Sig Dispense Refill   albuterol (PROVENTIL HFA;VENTOLIN HFA) 108 (90 BASE) MCG/ACT inhaler Inhale 2 puffs into the lungs every 6 (six) hours as needed for wheezing or shortness of breath.     Aspirin-Salicylamide-Caffeine (BC HEADACHE POWDER PO) Take 1 packet by mouth 4 (four) times daily as needed (headache).     atenolol (TENORMIN) 25 MG tablet Take 25 mg by mouth daily.     cyclobenzaprine (FLEXERIL) 5 MG tablet Take 1 tablet (5 mg total) by mouth 3 (three) times daily as needed. for muscle spams 30 tablet 3   HYDROcodone-acetaminophen (NORCO/VICODIN) 5-325 MG tablet One tablet every four hours as needed for acute pain.  Limit of five days per [place]. (Patient not taking: Reported on 01/01/2020) 30 tablet 0   No current facility-administered medications for this visit.     Physical Exam  Blood pressure 126/90, pulse (!) 103, height 5\' 8"  (1.727 m), weight 168 lb (76.2 kg).  Constitutional: overall normal hygiene, normal nutrition, well developed, normal grooming, normal  body habitus. Assistive device:none  Musculoskeletal: gait and station Limp none, muscle tone and strength are normal, no tremors or atrophy is present.  .  Neurological: coordination overall normal.  Deep tendon reflex/nerve stretch intact.  Sensation normal.  Cranial nerves II-XII intact.   Skin:   Normal overall no scars, lesions, ulcers or rashes. No psoriasis.  Psychiatric: Alert and oriented x 3.  Recent memory intact, remote memory unclear.  Normal mood and affect. Well groomed.  Good eye contact.  Cardiovascular: overall no swelling, no varicosities, no edema bilaterally, normal  temperatures of the legs and arms, no clubbing, cyanosis and good capillary refill.  Lymphatic: palpation is normal.  Spine/Pelvis examination:  Inspection:  Overall, sacoiliac joint benign and hips nontender; without crepitus or defects.   Thoracic spine inspection: Alignment normal without kyphosis present   Lumbar spine inspection:  Alignment  with normal lumbar lordosis, without scoliosis apparent.   Thoracic spine palpation:  without tenderness of spinal processes   Lumbar spine palpation: without tenderness of lumbar area; without tightness of lumbar muscles    Range of Motion:   Lumbar flexion, forward flexion is normal without pain or tenderness    Lumbar extension is full without pain or tenderness   Left lateral bend is normal without pain or tenderness   Right lateral bend is normal without pain or tenderness   Straight leg raising is normal  Strength & tone: normal   Stability overall normal stability  All other systems reviewed and are negative   The patient has been educated about the nature of the problem(s) and counseled on treatment options.  The patient appeared to understand what I have discussed and is in agreement with it.  Encounter Diagnoses  Name Primary?   Chronic right-sided low back pain with right-sided sciatica Yes   Cigarette nicotine dependence without complication     PLAN Call if any problems.  Precautions discussed.  Continue current medications.   Return to clinic 3 months   I refilled her Flexeril.  Electronically Signed Darreld Mclean, MD 8/31/20218:17 AM

## 2020-01-01 NOTE — Patient Instructions (Signed)
Steps to Quit Smoking Smoking tobacco is the leading cause of preventable death. It can affect almost every organ in the body. Smoking puts you and people around you at risk for many serious, long-lasting (chronic) diseases. Quitting smoking can be hard, but it is one of the best things that you can do for your health. It is never too late to quit. How do I get ready to quit? When you decide to quit smoking, make a plan to help you succeed. Before you quit:  Pick a date to quit. Set a date within the next 2 weeks to give you time to prepare.  Write down the reasons why you are quitting. Keep this list in places where you will see it often.  Tell your family, friends, and co-workers that you are quitting. Their support is important.  Talk with your doctor about the choices that may help you quit.  Find out if your health insurance will pay for these treatments.  Know the people, places, things, and activities that make you want to smoke (triggers). Avoid them. What first steps can I take to quit smoking?  Throw away all cigarettes at home, at work, and in your car.  Throw away the things that you use when you smoke, such as ashtrays and lighters.  Clean your car. Make sure to empty the ashtray.  Clean your home, including curtains and carpets. What can I do to help me quit smoking? Talk with your doctor about taking medicines and seeing a counselor at the same time. You are more likely to succeed when you do both.  If you are pregnant or breastfeeding, talk with your doctor about counseling or other ways to quit smoking. Do not take medicine to help you quit smoking unless your doctor tells you to do so. To quit smoking: Quit right away  Quit smoking totally, instead of slowly cutting back on how much you smoke over a period of time.  Go to counseling. You are more likely to quit if you go to counseling sessions regularly. Take medicine You may take medicines to help you quit. Some  medicines need a prescription, and some you can buy over-the-counter. Some medicines may contain a drug called nicotine to replace the nicotine in cigarettes. Medicines may:  Help you to stop having the desire to smoke (cravings).  Help to stop the problems that come when you stop smoking (withdrawal symptoms). Your doctor may ask you to use:  Nicotine patches, gum, or lozenges.  Nicotine inhalers or sprays.  Non-nicotine medicine that is taken by mouth. Find resources Find resources and other ways to help you quit smoking and remain smoke-free after you quit. These resources are most helpful when you use them often. They include:  Online chats with a counselor.  Phone quitlines.  Printed self-help materials.  Support groups or group counseling.  Text messaging programs.  Mobile phone apps. Use apps on your mobile phone or tablet that can help you stick to your quit plan. There are many free apps for mobile phones and tablets as well as websites. Examples include Quit Guide from the CDC and smokefree.gov  What things can I do to make it easier to quit?   Talk to your family and friends. Ask them to support and encourage you.  Call a phone quitline (1-800-QUIT-NOW), reach out to support groups, or work with a counselor.  Ask people who smoke to not smoke around you.  Avoid places that make you want to smoke,   such as: ? Bars. ? Parties. ? Smoke-break areas at work.  Spend time with people who do not smoke.  Lower the stress in your life. Stress can make you want to smoke. Try these things to help your stress: ? Getting regular exercise. ? Doing deep-breathing exercises. ? Doing yoga. ? Meditating. ? Doing a body scan. To do this, close your eyes, focus on one area of your body at a time from head to toe. Notice which parts of your body are tense. Try to relax the muscles in those areas. How will I feel when I quit smoking? Day 1 to 3 weeks Within the first 24 hours,  you may start to have some problems that come from quitting tobacco. These problems are very bad 2-3 days after you quit, but they do not often last for more than 2-3 weeks. You may get these symptoms:  Mood swings.  Feeling restless, nervous, angry, or annoyed.  Trouble concentrating.  Dizziness.  Strong desire for high-sugar foods and nicotine.  Weight gain.  Trouble pooping (constipation).  Feeling like you may vomit (nausea).  Coughing or a sore throat.  Changes in how the medicines that you take for other issues work in your body.  Depression.  Trouble sleeping (insomnia). Week 3 and afterward After the first 2-3 weeks of quitting, you may start to notice more positive results, such as:  Better sense of smell and taste.  Less coughing and sore throat.  Slower heart rate.  Lower blood pressure.  Clearer skin.  Better breathing.  Fewer sick days. Quitting smoking can be hard. Do not give up if you fail the first time. Some people need to try a few times before they succeed. Do your best to stick to your quit plan, and talk with your doctor if you have any questions or concerns. Summary  Smoking tobacco is the leading cause of preventable death. Quitting smoking can be hard, but it is one of the best things that you can do for your health.  When you decide to quit smoking, make a plan to help you succeed.  Quit smoking right away, not slowly over a period of time.  When you start quitting, seek help from your doctor, family, or friends. This information is not intended to replace advice given to you by your health care provider. Make sure you discuss any questions you have with your health care provider. Document Revised: 01/12/2019 Document Reviewed: 07/08/2018 Elsevier Patient Education  2020 [employer] Inc.  

## 2020-01-13 ENCOUNTER — Other Ambulatory Visit: Payer: Self-pay

## 2020-01-13 ENCOUNTER — Emergency Department (HOSPITAL_COMMUNITY)
Admission: EM | Admit: 2020-01-13 | Discharge: 2020-01-13 | Disposition: A | Discharge status: Home / Self Care | Payer: Self-pay | Attending: Emergency Medicine | Admitting: Emergency Medicine

## 2020-01-13 DIAGNOSIS — Z5321 Procedure and treatment not carried out due to patient leaving prior to being seen by health care provider: Secondary | ICD-10-CM | POA: Insufficient documentation

## 2020-01-13 DIAGNOSIS — R1030 Lower abdominal pain, unspecified: Secondary | ICD-10-CM | POA: Insufficient documentation

## 2020-01-13 DIAGNOSIS — M545 Low back pain: Secondary | ICD-10-CM | POA: Insufficient documentation

## 2020-01-13 NOTE — ED Triage Notes (Signed)
Pt c/o bilateral lower back pain, lower abdominal pain that radiates to the sides.

## 2022-07-01 ENCOUNTER — Encounter: Payer: Self-pay | Admitting: Radiology

## 2022-12-29 ENCOUNTER — Encounter: Payer: Self-pay | Admitting: Family Medicine

## 2022-12-29 ENCOUNTER — Ambulatory Visit: Payer: Medicaid Other | Admitting: Family Medicine

## 2022-12-29 VITALS — BP 117/81 | HR 103 | Ht 68.0 in | Wt 175.0 lb

## 2022-12-29 DIAGNOSIS — F411 Generalized anxiety disorder: Secondary | ICD-10-CM | POA: Insufficient documentation

## 2022-12-29 DIAGNOSIS — E038 Other specified hypothyroidism: Secondary | ICD-10-CM

## 2022-12-29 DIAGNOSIS — E7849 Other hyperlipidemia: Secondary | ICD-10-CM

## 2022-12-29 DIAGNOSIS — F319 Bipolar disorder, unspecified: Secondary | ICD-10-CM | POA: Diagnosis not present

## 2022-12-29 DIAGNOSIS — Z1211 Encounter for screening for malignant neoplasm of colon: Secondary | ICD-10-CM

## 2022-12-29 DIAGNOSIS — F410 Panic disorder [episodic paroxysmal anxiety] without agoraphobia: Secondary | ICD-10-CM | POA: Insufficient documentation

## 2022-12-29 DIAGNOSIS — Z720 Tobacco use: Secondary | ICD-10-CM

## 2022-12-29 DIAGNOSIS — J4489 Other specified chronic obstructive pulmonary disease: Secondary | ICD-10-CM | POA: Diagnosis not present

## 2022-12-29 DIAGNOSIS — R1319 Other dysphagia: Secondary | ICD-10-CM

## 2022-12-29 DIAGNOSIS — G43009 Migraine without aura, not intractable, without status migrainosus: Secondary | ICD-10-CM

## 2022-12-29 DIAGNOSIS — Z114 Encounter for screening for human immunodeficiency virus [HIV]: Secondary | ICD-10-CM

## 2022-12-29 DIAGNOSIS — F3132 Bipolar disorder, current episode depressed, moderate: Secondary | ICD-10-CM | POA: Diagnosis not present

## 2022-12-29 DIAGNOSIS — G43909 Migraine, unspecified, not intractable, without status migrainosus: Secondary | ICD-10-CM | POA: Insufficient documentation

## 2022-12-29 DIAGNOSIS — F4310 Post-traumatic stress disorder, unspecified: Secondary | ICD-10-CM | POA: Insufficient documentation

## 2022-12-29 DIAGNOSIS — R7301 Impaired fasting glucose: Secondary | ICD-10-CM

## 2022-12-29 DIAGNOSIS — E559 Vitamin D deficiency, unspecified: Secondary | ICD-10-CM

## 2022-12-29 DIAGNOSIS — Z1231 Encounter for screening mammogram for malignant neoplasm of breast: Secondary | ICD-10-CM

## 2022-12-29 DIAGNOSIS — Z91030 Bee allergy status: Secondary | ICD-10-CM

## 2022-12-29 DIAGNOSIS — Z1159 Encounter for screening for other viral diseases: Secondary | ICD-10-CM

## 2022-12-29 DIAGNOSIS — R1310 Dysphagia, unspecified: Secondary | ICD-10-CM | POA: Insufficient documentation

## 2022-12-29 MED ORDER — BREZTRI AEROSPHERE 160-9-4.8 MCG/ACT IN AERO: 2.0000 | INHALATION_SPRAY | Freq: Two times a day (BID) | RESPIRATORY_TRACT | 11 refills | Status: DC

## 2022-12-29 MED ORDER — RIBOFLAVIN 400 MG PO CAPS: 1.0000 | ORAL_CAPSULE | Freq: Every day | ORAL | 2 refills | Status: DC

## 2022-12-29 MED ORDER — EPINEPHRINE 0.3 MG/0.3ML IJ SOAJ: 0.3000 mg | INTRAMUSCULAR | 0 refills | Status: AC | PRN

## 2022-12-29 MED ORDER — ALBUTEROL SULFATE HFA 108 (90 BASE) MCG/ACT IN AERS: 2.0000 | INHALATION_SPRAY | Freq: Four times a day (QID) | RESPIRATORY_TRACT | 0 refills | Status: DC | PRN

## 2022-12-29 MED ORDER — NURTEC 75 MG PO TBDP: 75.0000 mg | ORAL_TABLET | ORAL | 0 refills | Status: DC | PRN

## 2022-12-29 NOTE — Assessment & Plan Note (Addendum)
The patient presents with shortness of breath, cough, and chest tightness. She has been without her rescue inhaler for over a year. Today, I have refilled her rescue inhaler and initiated treatment with a maintenance inhaler, Breztri, for ongoing management. A sample of the Breztri inhaler has been provided. To help prevent oral thrush, I advised the patient to rinse her mouth with water and spit it out--do not swallow--after using the inhaler. Additionally, I strongly recommend smoking cessation to improve overall health and reduce the risk of serious conditions, including COPD.

## 2022-12-29 NOTE — Assessment & Plan Note (Signed)
Refill Epi-Pen 

## 2022-12-29 NOTE — Assessment & Plan Note (Addendum)
The patient reports experiencing excruciating headaches daily, with sensitivity to light and noise, and occasional nausea and vomiting. Given her history of a heart attack, we will refrain from prescribing triptans. Currently, she uses Goody powders for symptom relief. We will initiate treatment with Nurtec 75 mg for abortive care of the migraines and recommend Riboflavin 400 mg daily for preventive management. Additionally, lifestyle changes are advised to help prevent migraines. These include avoiding or quitting smoking, reducing or eliminating caffeine and alcohol intake, maintaining a regular eating and sleeping schedule, and engaging in regular exercise.

## 2022-12-29 NOTE — Assessment & Plan Note (Signed)
The patient's GAD-7 score is 18 and her PHQ-9 score is 15. 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 Given her symptoms, we will avoid starting an SSRI for monotherapy of her depression, as it could potentially induce mania or hypomania. Although Wellbutrin was considered, the patient reported worsening mood swings during previous use for smoking cessation. Therefore, we will hold off on initiating therapy until she has a follow-up appointment with psychiatry. I advised that if she experiences suicidal thoughts or ideation, she should seek immediate assistance at [facility] in [place] or visit the Emergency Department.

## 2022-12-29 NOTE — Assessment & Plan Note (Signed)
The patient has a chronic condition of difficulty swallowing that has worsened over the past few years, with a history of well-managed GERD using over-the-counter medications. Recently, she reports experiencing more frequent symptoms including difficulty swallowing both liquids and solids. She notes that food often feels as though it is stuck in her esophagus, prompting coughing and occasional vomiting. She denies any pain associated with swallowing and reports no weight loss but describes a sensation of a knot in her chest. To further evaluate her symptoms, a referral to gastroenterology will be made for a possible endoscopy to inspect the esophagus and stomach for abnormalities.

## 2022-12-29 NOTE — Assessment & Plan Note (Signed)
Smokes about 2 pack/day  Asked about quitting: confirms that she currently smokes cigarettes Advise to quit smoking: Educated about QUITTING to reduce the risk of cancer, cardio and cerebrovascular disease. Assess willingness: Unwilling to quit at this time, but is working on cutting back. Assist with counseling and pharmacotherapy: Counseled for 5 minutes and literature provided. Arrange for follow up: follow up in 3 months and continue to offer help.

## 2022-12-29 NOTE — Patient Instructions (Addendum)
I appreciate the opportunity to provide care to you today!    Follow up:  1 months for pap smear  Labs: please stop by the lab during the week to get your blood drawn (CBC, CMP, TSH, Lipid profile, HgA1c, Vit D)  Screening: HIV and Hep C  Please schedule mammogram  Chronic Obstructive Pulmonary Disease (COPD): -Medication: Please start using the Breztri inhaler by inhaling 2 puffs into the lungs twice daily. A refill for your albuterol rescue inhaler has been sent to your pharmacy. Mouth Care: To help prevent the development of oral thrush, rinse your mouth with water and spit it out after using the inhaler--do not swallow. -Smoking Cessation: I strongly recommend smoking cessation to improve your overall health and reduce the risk of serious conditions, including COPD and various other health issues.  Tobacco Use: I urge you to quit smoking as it significantly raises your risk of numerous severe health problems, such as cancer, COPD, high blood pressure, and oral health issues. Smoking also exacerbates throat irritation and persistent coughing. Quitting can markedly reduce these risks and enhance your quality of life.  Migraine Headaches: -Abortive Treatment: I have prescribed Nurtec 75 mg to be taken as needed. Please note that the maximum dosage within a 24-hour period is 75 mg. -Prophylactic Treatment: I have also prescribed Riboflavin 400 mg daily for migraine prevention. Lifestyle Changes: To help prevent migraines, consider the following lifestyle modifications: Avoid or stop smoking Reduce or avoid caffeine and alcohol Maintain a regular eating and sleeping schedule Engage in regular exercise  Bipolar I Disorder: A referral has been made to psychiatry for further management and treatment of Bipolar I disorder.  Dysphagia: -Referral: A referral to GI has been placed for a possible endoscopy to inspect the esophagus and stomach for abnormalities. -Dietary Modifications: I  recommend adjusting food and liquid textures for easier swallowing, using thickening agents as needed. Behavioral Adjustments: Encourage slow eating, thorough chewing, taking smaller bites, and being mindful during meals to reduce choking risks and improve swallowing. Bee Sting Allergy: A refill for your EpiPen has been sent to your pharmacy.   Referrals today-  GI, Psychiatry   Please continue to a heart-healthy diet and increase your physical activities. Try to exercise for at least five days a week.    It was a pleasure to see you and I look forward to continuing to work together on your health and well-being. Please do not hesitate to call the office if you need care or have questions about your care.  In case of emergency, please visit the Emergency Department for urgent care, or contact our clinic at [phone] to schedule an appointment. We're here to help you!   Have a wonderful day and week. With Gratitude, Gilmore Laroche MSN, FNP-BC

## 2022-12-29 NOTE — Progress Notes (Signed)
New Patient Office Visit  Subjective:  Patient ID: Nichole [name], female    DOB: 1970/09/25  Age: 52 y.o. MRN: [id number]  CC:  Chief Complaint  Patient presents with   Establish Care    New patient establishing care hasn't had a pcp in over 8 years. Pt reports difficulty swallowing drinks and food at times, getting worse. Has family hx of cancer has concerns about this for her.     Headache    Pt reports severe headaches daily goes through goody powder medication .    Medication Refill    Needs inhaler and epi pen rx updated.     HPI Nichole [name] is a 52 y.o. female with past medical history of bipolar 1, bee sting allergy, COPD with asthma, tobacco use and  migraine headaches presents for establishing care. For the details of today's visit, please refer to the assessment and plan.     Past Medical History:  Diagnosis Date   Arthritis    Asthma    Bipolar 1 disorder (HCC)    COPD (chronic obstructive pulmonary disease) (HCC)    COPD (chronic obstructive pulmonary disease) (HCC)    Heart attack (HCC)    Manic depression (HCC)    Panic attacks     Past Surgical History:  Procedure Laterality Date   CESAREAN SECTION     cyst removed from wrist     TONSILLECTOMY     TUBAL LIGATION      Family History  Problem Relation Age of Onset   Heart disease Mother    Mental illness Mother    Hypertension Mother    Lung disease Father    Heart disease Father    Cancer Brother     Social History   Socioeconomic History   Marital status: Married    Spouse name: Not on file   Number of children: Not on file   Years of education: Not on file   Highest education level: Not on file  Occupational History   Not on file  Tobacco Use   Smoking status: Every Day    Current packs/day: 1.00    Types: Cigarettes   Smokeless tobacco: Never  Substance and Sexual Activity   Alcohol use: Yes    Comment: occ   Drug use: No   Sexual activity: Yes  Other Topics Concern   Not  on file  Social History Narrative   Not on file   Social Determinants of Health   Financial Resource Strain: Not on file  Food Insecurity: Not on file  Transportation Needs: Not on file  Physical Activity: Not on file  Stress: Not on file  Social Connections: Not on file  Intimate Partner Violence: Not on file    ROS Review of Systems  Constitutional:  Negative for chills and fever.  Eyes:  Negative for visual disturbance.  Respiratory:  Positive for chest tightness and shortness of breath. Negative for wheezing.   Neurological:  Negative for dizziness and headaches.  Psychiatric/Behavioral:  Positive for suicidal ideas.     Objective:   Today's Vitals: BP 117/81   Pulse (!) 103   Ht 5\' 8"  (1.727 m)   Wt 175 lb (79.4 kg)   SpO2 94%   BMI 26.61 kg/m   Physical Exam HENT:     Head: Normocephalic.     Mouth/Throat:     Mouth: Mucous membranes are moist.  Cardiovascular:     Rate and Rhythm: Normal rate.  Heart sounds: Normal heart sounds.  Pulmonary:     Effort: Pulmonary effort is normal.     Breath sounds: Normal breath sounds.  Neurological:     Mental Status: She is alert.      Assessment & Plan:   Bipolar 1 disorder [facility]) Assessment & Plan: The patient was previously followed up at [facility] 8 years ago and was diagnosed with bipolar 1 condition prior to 2010. She was on Depakote but has not been on therapy for the past 8 years. She reports a recent worsening of depression and mood swings, and frequently experiences suicidal thoughts, although she assures that she will not attempt suicide again. Her last suicide attempt was in 1995. The patient expresses feelings of being better off dead but affirms that she would not harm herself. She also reports significant life stress and financial strain. I advised her to seek immediate assistance at [facility] in [place] or the Emergency Department if she experiences suicidal thoughts or ideation. A  referral to psychiatry has been made for further evaluation and support.    Orders: -     Ambulatory referral to Psychiatry  COPD with asthma Assessment & Plan: The patient presents with shortness of breath, cough, and chest tightness. She has been without her rescue inhaler for over a year. Today, I have refilled her rescue inhaler and initiated treatment with a maintenance inhaler, Breztri, for ongoing management. A sample of the Breztri inhaler has been provided. To help prevent oral thrush, I advised the patient to rinse her mouth with water and spit it out--do not swallow--after using the inhaler. Additionally, I strongly recommend smoking cessation to improve overall health and reduce the risk of serious conditions, including COPD.  Orders: -     Albuterol Sulfate HFA; Inhale 2 puffs into the lungs every 6 (six) hours as needed for wheezing or shortness of breath.  Dispense: 18 g; Refill: 0 -     Breztri Aerosphere; Inhale 2 puffs into the lungs 2 (two) times daily.  Dispense: 10.7 g; Refill: 11  Bipolar affective disorder, currently depressed, moderate (HCC) Assessment & Plan: The patient's GAD-7 score is 18 and her PHQ-9 score is 15. 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 Given her symptoms, we will avoid starting an SSRI for monotherapy of her depression, as it could potentially induce mania or hypomania. Although Wellbutrin was considered, the patient reported worsening mood swings during previous use for smoking cessation. Therefore, we will hold off on initiating therapy until she has a follow-up appointment with psychiatry. I advised that if she experiences suicidal thoughts or ideation, she should seek immediate assistance at [facility] in [place] or visit the Emergency Department.   Migraine without aura and without status migrainosus, not  intractable Assessment & Plan: The patient reports experiencing excruciating headaches daily, with sensitivity to light and noise, and occasional nausea and vomiting. Given her history of a heart attack, we will refrain from prescribing triptans. Currently, she uses Goody powders for symptom relief. We will initiate treatment with Nurtec 75 mg for abortive care of the migraines and recommend Riboflavin 400 mg daily for preventive management. Additionally, lifestyle changes are advised to help prevent migraines. These include avoiding or quitting smoking, reducing or eliminating caffeine and alcohol intake, maintaining a regular eating and sleeping schedule, and engaging in regular exercise.  Orders: -  Nurtec; Take 1 tablet (75 mg total) by mouth as needed.  Dispense: 16 tablet; Refill: 0 -     Riboflavin; Take 1 capsule (400 mg total) by mouth daily.  Dispense: 30 capsule; Refill: 2  Esophageal dysphagia Assessment & Plan: The patient has a chronic condition of difficulty swallowing that has worsened over the past few years, with a history of well-managed GERD using over-the-counter medications. Recently, she reports experiencing more frequent symptoms including difficulty swallowing both liquids and solids. She notes that food often feels as though it is stuck in her esophagus, prompting coughing and occasional vomiting. She denies any pain associated with swallowing and reports no weight loss but describes a sensation of a knot in her chest. To further evaluate her symptoms, a referral to gastroenterology will be made for a possible endoscopy to inspect the esophagus and stomach for abnormalities.  Orders: -     Ambulatory referral to Gastroenterology  Tobacco use Assessment & Plan: Smokes about 2 pack/day  Asked about quitting: confirms that she currently smokes cigarettes Advise to quit smoking: Educated about QUITTING to reduce the risk of cancer, cardio and cerebrovascular  disease. Assess willingness: Unwilling to quit at this time, but is working on cutting back. Assist with counseling and pharmacotherapy: Counseled for 5 minutes and literature provided. Arrange for follow up: follow up in 3 months and continue to offer help.    Bee sting allergy Assessment & Plan: Refill EpiPen  Orders: -     EPINEPHrine; Inject 0.3 mg into the muscle as needed for anaphylaxis.  Dispense: 1 each; Refill: 0  Breast cancer screening by mammogram -     3D Screening Mammogram, Left and Right  IFG (impaired fasting glucose) -     Hemoglobin A1c  Vitamin D deficiency -     VITAMIN D 25 Hydroxy (Vit-D Deficiency, Fractures)  Need for hepatitis C screening test -     Hepatitis C antibody  Encounter for screening for HIV -     HIV Antibody (routine testing w rflx)  Other specified hypothyroidism -     TSH + free T4  Other hyperlipidemia -     Lipid panel -     CMP14+EGFR -     CBC with Differential/Platelet  Colon cancer screening -     Cologuard     Follow-up: Return in about 1 month (around 01/29/2023) for pap smear.   Gilmore Laroche, FNP

## 2022-12-29 NOTE — Assessment & Plan Note (Addendum)
The patient was previously followed up at [facility] 8 years ago and was diagnosed with bipolar 1 condition prior to 2010. She was on Depakote but has not been on therapy for the past 8 years. She reports a recent worsening of depression and mood swings, and frequently experiences suicidal thoughts, although she assures that she will not attempt suicide again. Her last suicide attempt was in 1995. The patient expresses feelings of being better off dead but affirms that she would not harm herself. She also reports significant life stress and financial strain. I advised her to seek immediate assistance at [facility] in [place] or the Emergency Department if she experiences suicidal thoughts or ideation. A referral to psychiatry has been made for further evaluation and support.

## 2022-12-31 DIAGNOSIS — E559 Vitamin D deficiency, unspecified: Secondary | ICD-10-CM | POA: Diagnosis not present

## 2022-12-31 DIAGNOSIS — R7301 Impaired fasting glucose: Secondary | ICD-10-CM | POA: Diagnosis not present

## 2022-12-31 DIAGNOSIS — Z1159 Encounter for screening for other viral diseases: Secondary | ICD-10-CM | POA: Diagnosis not present

## 2022-12-31 DIAGNOSIS — Z114 Encounter for screening for human immunodeficiency virus [HIV]: Secondary | ICD-10-CM | POA: Diagnosis not present

## 2023-01-01 LAB — CMP14+EGFR
ALT: 11 IU/L (ref 0–32)
AST: 13 IU/L (ref 0–40)
Albumin: 4.5 g/dL (ref 3.9–4.9)
Alkaline Phosphatase: 94 IU/L (ref 44–121)
BUN/Creatinine Ratio: 28 — ABNORMAL HIGH (ref 9–23)
BUN: 18 mg/dL (ref 6–24)
Bilirubin Total: 0.2 mg/dL (ref 0.0–1.2)
CO2: 18 mmol/L — ABNORMAL LOW (ref 20–29)
Calcium: 9.9 mg/dL (ref 8.7–10.2)
Chloride: 104 mmol/L (ref 96–106)
Creatinine, Ser: 0.65 mg/dL (ref 0.57–1.00)
Globulin, Total: 2.6 g/dL (ref 1.5–4.5)
Glucose: 100 mg/dL — ABNORMAL HIGH (ref 70–99)
Potassium: 4.6 mmol/L (ref 3.5–5.2)
Sodium: 141 mmol/L (ref 134–144)
Total Protein: 7.1 g/dL (ref 6.0–8.5)
eGFR: 107 mL/min/{1.73_m2} (ref >59)

## 2023-01-01 LAB — LIPID PANEL
Chol/HDL Ratio: 7.5 ratio — ABNORMAL HIGH (ref 0.0–4.4)
Cholesterol, Total: 324 mg/dL — ABNORMAL HIGH (ref 100–199)
HDL: 43 mg/dL (ref >39)
LDL Chol Calc (NIH): 229 mg/dL — ABNORMAL HIGH (ref 0–99)
Triglycerides: 256 mg/dL — ABNORMAL HIGH (ref 0–149)
VLDL Cholesterol Cal: 52 mg/dL — ABNORMAL HIGH (ref 5–40)

## 2023-01-01 LAB — CBC WITH DIFFERENTIAL/PLATELET
Basophils Absolute: 0.1 10*3/uL (ref 0.0–0.2)
Basos: 1 %
EOS (ABSOLUTE): 0.4 10*3/uL (ref 0.0–0.4)
Eos: 4 %
Hematocrit: 44.5 % (ref 34.0–46.6)
Hemoglobin: 15.3 g/dL (ref 11.1–15.9)
Immature Grans (Abs): 0 10*3/uL (ref 0.0–0.1)
Immature Granulocytes: 0 %
Lymphocytes Absolute: 2.2 10*3/uL (ref 0.7–3.1)
Lymphs: 25 %
MCH: 31.2 pg (ref 26.6–33.0)
MCHC: 34.4 g/dL (ref 31.5–35.7)
MCV: 91 fL (ref 79–97)
Monocytes Absolute: 0.4 10*3/uL (ref 0.1–0.9)
Monocytes: 5 %
Neutrophils Absolute: 5.9 10*3/uL (ref 1.4–7.0)
Neutrophils: 65 %
Platelets: 385 10*3/uL (ref 150–450)
RBC: 4.9 x10E6/uL (ref 3.77–5.28)
RDW: 13.4 % (ref 11.7–15.4)
WBC: 8.9 10*3/uL (ref 3.4–10.8)

## 2023-01-01 LAB — HEPATITIS C ANTIBODY: Hep C Virus Ab: NONREACTIVE

## 2023-01-01 LAB — HEMOGLOBIN A1C
Est. average glucose Bld gHb Est-mCnc: 126 mg/dL
Hgb A1c MFr Bld: 6 % — ABNORMAL HIGH (ref 4.8–5.6)

## 2023-01-01 LAB — VITAMIN D 25 HYDROXY (VIT D DEFICIENCY, FRACTURES): Vit D, 25-Hydroxy: 25.2 ng/mL — ABNORMAL LOW (ref 30.0–100.0)

## 2023-01-01 LAB — TSH+FREE T4
Free T4: 0.83 ng/dL (ref 0.82–1.77)
TSH: 1.69 u[IU]/mL (ref 0.450–4.500)

## 2023-01-01 LAB — HIV ANTIBODY (ROUTINE TESTING W REFLEX): HIV Screen 4th Generation wRfx: NONREACTIVE

## 2023-01-02 ENCOUNTER — Other Ambulatory Visit: Payer: Self-pay | Admitting: Family Medicine

## 2023-01-02 DIAGNOSIS — E7849 Other hyperlipidemia: Secondary | ICD-10-CM

## 2023-01-02 DIAGNOSIS — E559 Vitamin D deficiency, unspecified: Secondary | ICD-10-CM

## 2023-01-02 MED ORDER — VITAMIN D (ERGOCALCIFEROL) 1.25 MG (50000 UNIT) PO CAPS: 50000.0000 [IU] | ORAL_CAPSULE | ORAL | 1 refills | Status: DC

## 2023-01-02 MED ORDER — ROSUVASTATIN CALCIUM 10 MG PO TABS: 10.0000 mg | ORAL_TABLET | Freq: Every day | ORAL | 3 refills | Status: DC

## 2023-01-02 NOTE — Progress Notes (Unsigned)
The ASCVD Risk score (Arnett DK, et al., 2019) failed to calculate for the following reasons:   The valid total cholesterol range is 130 to 320 mg/dL  

## 2023-01-02 NOTE — Progress Notes (Signed)
Please inform the patient that her vitamin D levels are low, and a prescription for a weekly vitamin D supplement has been sent to her pharmacy to begin taking. Her cholesterol levels are elevated, and I want her LDL cholesterol to be below 100. I recommend lifestyle changes, including reducing the intake of greasy, fatty, and starchy foods, along with increasing physical activity. To help lower her cholesterol levels, I have started her on rosuvastatin 10 mg daily, and the prescription has been sent to her pharmacy.

## 2023-01-05 ENCOUNTER — Ambulatory Visit (INDEPENDENT_AMBULATORY_CARE_PROVIDER_SITE_OTHER): Payer: Medicaid Other | Admitting: Gastroenterology

## 2023-01-05 ENCOUNTER — Encounter (INDEPENDENT_AMBULATORY_CARE_PROVIDER_SITE_OTHER): Payer: Self-pay | Admitting: Gastroenterology

## 2023-01-05 VITALS — BP 123/85 | HR 105 | Temp 97.9°F | Ht 68.0 in | Wt 174.1 lb

## 2023-01-05 DIAGNOSIS — R1310 Dysphagia, unspecified: Secondary | ICD-10-CM | POA: Diagnosis not present

## 2023-01-05 DIAGNOSIS — Z800 Family history of malignant neoplasm of digestive organs: Secondary | ICD-10-CM

## 2023-01-05 DIAGNOSIS — R1319 Other dysphagia: Secondary | ICD-10-CM

## 2023-01-05 DIAGNOSIS — F17210 Nicotine dependence, cigarettes, uncomplicated: Secondary | ICD-10-CM | POA: Diagnosis not present

## 2023-01-05 DIAGNOSIS — F17200 Nicotine dependence, unspecified, uncomplicated: Secondary | ICD-10-CM

## 2023-01-05 NOTE — Progress Notes (Signed)
Vista Lawman , M.D. Gastroenterology & Hepatology [facility] Gastroenterology [address] [place], [place] [address] Primary Care Physician: Gilmore Laroche, FNP [address] [place] [address]  Chief Complaint: Dysphagia  History of Present Illness:  Nichole [name] is a 52 y.o. female with bipolar , COPD with asthma, tobacco use and  migraine headaches who presents for evaluation of Dysphagia  Patient reports that for past 1 year she has solid and liquid food dysphagia and reports as if there is a knot in the middle of her chest.  She feels as if solid food is sitting in the middle of her chest and she would regurgitate food once in a while.  Patient denies weight loss.  Patient is a current diabetes tobacco smoker  Reports 1 soft bowel movement daily and denies constipation or hematochezia  She had a brother was diagnosed with colon cancer at age 3  The patient denies having any fever, chills, hematochezia, melena, hematemesis, abdominal distention, abdominal pain, diarrhea, jaundice, pruritus or weight loss.  Last WGN:FAOZ Last Colonoscopy:none  FHx: Brother diagnosed with colon cancer at age 28 Social: Current heavy smoker, no alcohol or illicit drug use Surgical: C-section  Recent blood work with hemoglobin 15.3 platelets 387 liver enzymes hep C negative  Past Medical History: Past Medical History:  Diagnosis Date   Arthritis    Asthma    Bipolar 1 disorder (HCC)    COPD (chronic obstructive pulmonary disease) (HCC)    COPD (chronic obstructive pulmonary disease) (HCC)    Heart attack (HCC)    Manic depression (HCC)    Panic attacks     Past Surgical History: Past Surgical History:  Procedure Laterality Date   CESAREAN SECTION     cyst removed from wrist     TONSILLECTOMY     TUBAL LIGATION      Family History: Family History  Problem Relation Age of Onset   Heart disease Mother    Mental illness Mother     Hypertension Mother    Lung disease Father    Heart disease Father    Cancer Brother     Social History: Social History   Tobacco Use  Smoking Status Every Day   Current packs/day: 1.00   Types: Cigarettes  Smokeless Tobacco Never   Social History   Substance and Sexual Activity  Alcohol Use Yes   Comment: occ   Social History   Substance and Sexual Activity  Drug Use No    Allergies: Allergies  Allergen Reactions   Bee Venom Anaphylaxis and Hives   Alka-Seltzer [Aspirin Effervescent]    Percocet [Oxycodone-Acetaminophen]     headache    Medications: Current Outpatient Medications  Medication Sig Dispense Refill   albuterol (VENTOLIN HFA) 108 (90 Base) MCG/ACT inhaler Inhale 2 puffs into the lungs every 6 (six) hours as needed for wheezing or shortness of breath. 18 g 0   Aspirin-Salicylamide-Caffeine (BC HEADACHE POWDER PO) Take 1 packet by mouth 4 (four) times daily as needed (headache).     Budeson-Glycopyrrol-Formoterol (BREZTRI AEROSPHERE) 160-9-4.8 MCG/ACT AERO Inhale 2 puffs into the lungs 2 (two) times daily. 10.7 g 11   diphenhydrAMINE (BENADRYL) 25 mg capsule Take 25 mg by mouth as needed for itching.     EPINEPHrine (EPIPEN 2-PAK) 0.3 mg/0.3 mL IJ SOAJ injection Inject 0.3 mg into the muscle as needed for anaphylaxis. 1 each 0   Riboflavin 400 MG CAPS Take 1 capsule (400 mg total)  by mouth daily. 30 capsule 2   Rimegepant Sulfate (NURTEC) 75 MG TBDP Take 1 tablet (75 mg total) by mouth as needed. 16 tablet 0   rosuvastatin (CRESTOR) 10 MG tablet Take 1 tablet (10 mg total) by mouth daily. 90 tablet 3   Vitamin D, Ergocalciferol, (DRISDOL) 1.25 MG (50000 UNIT) CAPS capsule Take 1 capsule (50,000 Units total) by mouth every 7 (seven) days. 20 capsule 1   No current facility-administered medications for this visit.    Review of Systems: GENERAL: negative for malaise, night sweats HEENT: No changes in hearing or vision, no nose bleeds or other nasal  problems. NECK: Negative for lumps, goiter, pain and significant neck swelling RESPIRATORY: Negative for cough, wheezing CARDIOVASCULAR: Negative for chest pain, leg swelling, palpitations, orthopnea GI: SEE HPI MUSCULOSKELETAL: Negative for joint pain or swelling, back pain, and muscle pain. SKIN: Negative for lesions, rash HEMATOLOGY Negative for prolonged bleeding, bruising easily, and swollen nodes. ENDOCRINE: Negative for cold or heat intolerance, polyuria, polydipsia and goiter. NEURO: negative for tremor, gait imbalance, syncope and seizures. The remainder of the review of systems is noncontributory.   Physical Exam: BP 123/85   Pulse (!) 105   Temp 97.9 F (36.6 C)   Ht 5\' 8"  (1.727 m)   Wt 174 lb 1.6 oz (79 kg)   BMI 26.47 kg/m  GENERAL: The patient is AO x3, in no acute distress. HEENT: Head is normocephalic and atraumatic. EOMI are intact. Mouth is well hydrated and without lesions. NECK: Supple. No masses LUNGS: Clear to auscultation. No presence of rhonchi/wheezing/rales. Adequate chest expansion HEART: RRR, normal s1 and s2. ABDOMEN: Soft, nontender, no guarding, no peritoneal signs, and nondistended. BS +. No masses. EXTREMITIES: Without any cyanosis, clubbing, rash, lesions or edema. NEUROLOGIC: AOx3, no focal motor deficit. SKIN: no jaundice, no rashes   Imaging/Labs: as above     Latest Ref Rng & Units 12/31/2022    8:31 AM 01/07/2014   12:50 PM 07/02/2013   10:36 AM  CBC  WBC 3.4 - 10.8 x10E3/uL 8.9  11.5  8.9   Hemoglobin 11.1 - 15.9 g/dL 65.7  84.6  96.2   Hematocrit 34.0 - 46.6 % 44.5  43.2  43.2   Platelets 150 - 450 x10E3/uL 385  371  388    No results found for: "IRON", "TIBC", "FERRITIN"  I personally reviewed and interpreted the available labs, imaging and endoscopic files.  Impression and Plan: Nichole [name] is a 52 y.o. female with bipolar , COPD with asthma, tobacco use and  migraine headaches who presents for evaluation of  Dysphagia  #Dysphagia       Patient is an active tobacco smoker with solid and liquid food dysphagia it is imperative to rule out malignancy.  This also could be due to rings or stricture  Recent blood work with hemoglobin 15.3 platelets 387 normal liver enzymes hep C negative  Will proceed with upper endoscopy with esophageal biopsies plus and minus dilation if any stricture is encountered  Aspiration precautions - Cut food in small pieces and chew food thoroughly to avoid regurgitation episodes. - Discussed avoidance of eating within 3 hours of lying down to sleep and benefit of blocks to elevate head of bed. Also, will benefit from avoiding carbonated drinks/sodas or food that has tomatoes, spicy or greasy food.    #Family history of colon cancer Patient has a brother with colon cancer diagnosed at age 66 which puts patient at high risk for colorectal cancer given  first-degree relative diagnosed with colon cancer age less than 60  Ideal modality for screening for colon cancer for this patient is colonoscopy, although patient for now has opted for Cologuard which she received today  May readdress colonoscopy in future  #Tobacco dependence Patient is active tobacco smoker, had extensive discussion regarding family history of multiple cancers and tobacco being a carcinogen.  She reports on actively trying to quit smoking  All questions were answered.      Vista Lawman, MD Gastroenterology and Hepatology [facility] Gastroenterology   This chart has been completed using [facility] Dictation software, and while attempts have been made to ensure accuracy , certain words and phrases may not be transcribed as intended

## 2023-01-05 NOTE — Patient Instructions (Signed)
It was very nice to meet you today, as dicussed with will plan for the following :  1) Upper endoscopy  2) 1) Avoid coffee, tea, cola beverages, carbonated beverages, spicy foods, greasy foods, foods high in acid content (e.g. tomatoes and citrus fruits), chocolate, and peppermint 2) Avoid drinking alcoholic beverages 3) Avoid smoking 4) Eat small meals and keep weight within normal range 5) Avoid recumbent posture for 3 hours post-prandially 6) Elevate head of bed

## 2023-01-06 ENCOUNTER — Encounter (HOSPITAL_COMMUNITY): Payer: Self-pay

## 2023-01-06 ENCOUNTER — Ambulatory Visit (HOSPITAL_COMMUNITY)
Admission: RE | Admit: 2023-01-06 | Discharge: 2023-01-06 | Disposition: A | Discharge status: Home / Self Care | Payer: Medicaid Other | Source: Ambulatory Visit | Attending: Family Medicine | Admitting: Family Medicine

## 2023-01-06 DIAGNOSIS — Z1231 Encounter for screening mammogram for malignant neoplasm of breast: Secondary | ICD-10-CM | POA: Insufficient documentation

## 2023-01-08 ENCOUNTER — Other Ambulatory Visit: Payer: Self-pay | Admitting: Family Medicine

## 2023-01-08 DIAGNOSIS — E559 Vitamin D deficiency, unspecified: Secondary | ICD-10-CM

## 2023-01-08 DIAGNOSIS — E7849 Other hyperlipidemia: Secondary | ICD-10-CM

## 2023-01-08 MED ORDER — VITAMIN D (ERGOCALCIFEROL) 1.25 MG (50000 UNIT) PO CAPS: 50000.0000 [IU] | ORAL_CAPSULE | ORAL | 1 refills | Status: DC

## 2023-01-08 MED ORDER — ROSUVASTATIN CALCIUM 10 MG PO TABS: 10.0000 mg | ORAL_TABLET | Freq: Every day | ORAL | 3 refills | Status: DC

## 2023-01-10 LAB — COLOGUARD: COLOGUARD: POSITIVE — AB

## 2023-01-14 ENCOUNTER — Other Ambulatory Visit: Payer: Self-pay | Admitting: Family Medicine

## 2023-01-14 DIAGNOSIS — R195 Other fecal abnormalities: Secondary | ICD-10-CM

## 2023-01-14 NOTE — Progress Notes (Signed)
Please inform the patient that her recent Cologuard test came back positive. This means further evaluation is recommended to determine the cause of the result. I have placed a referral to the Gastroenterology, and they will contact her soon to schedule an appointment

## 2023-01-17 ENCOUNTER — Encounter (INDEPENDENT_AMBULATORY_CARE_PROVIDER_SITE_OTHER): Payer: Self-pay | Admitting: *Deleted

## 2023-01-18 ENCOUNTER — Encounter (INDEPENDENT_AMBULATORY_CARE_PROVIDER_SITE_OTHER): Payer: Self-pay | Admitting: Gastroenterology

## 2023-01-18 ENCOUNTER — Ambulatory Visit (INDEPENDENT_AMBULATORY_CARE_PROVIDER_SITE_OTHER): Payer: Medicaid Other | Admitting: Gastroenterology

## 2023-01-18 VITALS — BP 129/75 | HR 108 | Temp 98.3°F | Ht 68.0 in | Wt 177.1 lb

## 2023-01-18 DIAGNOSIS — R1319 Other dysphagia: Secondary | ICD-10-CM

## 2023-01-18 DIAGNOSIS — R195 Other fecal abnormalities: Secondary | ICD-10-CM | POA: Diagnosis not present

## 2023-01-18 DIAGNOSIS — Z720 Tobacco use: Secondary | ICD-10-CM

## 2023-01-18 DIAGNOSIS — Z800 Family history of malignant neoplasm of digestive organs: Secondary | ICD-10-CM | POA: Diagnosis not present

## 2023-01-18 MED ORDER — PEG 3350-KCL-NA BICARB-NACL 420 G PO SOLR: 4000.0000 mL | Freq: Once | ORAL | 0 refills | Status: AC

## 2023-01-18 NOTE — H&P (View-Only) (Signed)
Nichole [name] , M.D. Gastroenterology & Hepatology [facility] Gastroenterology [address] [place], [place] [address] Primary Care Physician: Gilmore Laroche, FNP [address] [place] [address]  Chief Complaint: Dysphagia  History of Present Illness:  Nichole [name] is a 52 y.o. female with bipolar , COPD with asthma, tobacco use and  migraine headaches, family history of Colon cancer  who presents for evaluation of Dysphagia and now with positive Cologuard   Patient reports that for past 1 year she has solid and liquid food dysphagia and reports as if there is a knot in the middle of her chest.  She feels as if solid food is sitting in the middle of her chest and she would regurgitate food once in a while.  Patient denies weight loss.  Patient is a current diabetes tobacco smoker  Reports 1 soft bowel movement daily and denies constipation or hematochezia  She had a brother was diagnosed with colon cancer at age 46  Had a positive Cologuard 01/06/2023  The patient denies having any fever, chills, hematochezia, melena, hematemesis, abdominal distention, abdominal pain, diarrhea, jaundice, pruritus or weight loss.  Last RJJ:OACZ Last Colonoscopy:none  FHx: Brother diagnosed with colon cancer at age 59 Social: Current heavy smoker, no alcohol or illicit drug use Surgical: C-section  Recent blood work with hemoglobin 15.3 platelets 387 liver enzymes hep C negative  Past Medical History: Past Medical History:  Diagnosis Date   Arthritis    Asthma    Bipolar 1 disorder (HCC)    COPD (chronic obstructive pulmonary disease) (HCC)    COPD (chronic obstructive pulmonary disease) (HCC)    Heart attack (HCC)    Manic depression (HCC)    Panic attacks     Past Surgical History: Past Surgical History:  Procedure Laterality Date   CESAREAN SECTION     cyst removed from wrist     TONSILLECTOMY     TUBAL LIGATION      Family  History: Family History  Problem Relation Age of Onset   Heart disease Mother    Mental illness Mother    Hypertension Mother    Lung disease Father    Heart disease Father    Lung cancer Father    Cancer Brother    Colon cancer Brother    Skin cancer Brother    Lung cancer Brother    Breast cancer Maternal Grandmother    Thyroid cancer Maternal Grandmother     Social History: Social History   Tobacco Use  Smoking Status Every Day   Current packs/day: 1.00   Types: Cigarettes   Passive exposure: Current  Smokeless Tobacco Never   Social History   Substance and Sexual Activity  Alcohol Use Yes   Comment: occ   Social History   Substance and Sexual Activity  Drug Use No    Allergies: Allergies  Allergen Reactions   Bee Venom Anaphylaxis and Hives   Alka-Seltzer [Aspirin Effervescent]    Percocet [Oxycodone-Acetaminophen]     headache    Medications: Current Outpatient Medications  Medication Sig Dispense Refill   albuterol (VENTOLIN HFA) 108 (90 Base) MCG/ACT inhaler Inhale 2 puffs into the lungs every 6 (six) hours as needed for wheezing or shortness of breath. 18 g 0   Aspirin-Acetaminophen-Caffeine (GOODY HEADACHE PO) Take by mouth.     Budeson-Glycopyrrol-Formoterol (BREZTRI AEROSPHERE) 160-9-4.8 MCG/ACT AERO Inhale 2 puffs into the lungs 2 (two) times daily. 10.7 g 11   diphenhydrAMINE (  BENADRYL) 25 mg capsule Take 25 mg by mouth as needed for itching.     EPINEPHrine (EPIPEN 2-PAK) 0.3 mg/0.3 mL IJ SOAJ injection Inject 0.3 mg into the muscle as needed for anaphylaxis. 1 each 0   Rimegepant Sulfate (NURTEC) 75 MG TBDP Take 1 tablet (75 mg total) by mouth as needed. 16 tablet 0   rosuvastatin (CRESTOR) 10 MG tablet Take 1 tablet (10 mg total) by mouth daily. 90 tablet 3   Vitamin D, Ergocalciferol, (DRISDOL) 1.25 MG (50000 UNIT) CAPS capsule Take 1 capsule (50,000 Units total) by mouth every 7 (seven) days. 20 capsule 1   Riboflavin 400 MG CAPS Take 1  capsule (400 mg total) by mouth daily. (Patient not taking: Reported on 01/18/2023) 30 capsule 2   No current facility-administered medications for this visit.    Review of Systems: GENERAL: negative for malaise, night sweats HEENT: No changes in hearing or vision, no nose bleeds or other nasal problems. NECK: Negative for lumps, goiter, pain and significant neck swelling RESPIRATORY: Negative for cough, wheezing CARDIOVASCULAR: Negative for chest pain, leg swelling, palpitations, orthopnea GI: SEE HPI MUSCULOSKELETAL: Negative for joint pain or swelling, back pain, and muscle pain. SKIN: Negative for lesions, rash HEMATOLOGY Negative for prolonged bleeding, bruising easily, and swollen nodes. ENDOCRINE: Negative for cold or heat intolerance, polyuria, polydipsia and goiter. NEURO: negative for tremor, gait imbalance, syncope and seizures. The remainder of the review of systems is noncontributory.   Physical Exam: BP 129/75 (BP Location: Right Arm, Patient Position: Sitting, Cuff Size: Large)   Pulse (!) 108   Temp 98.3 F (36.8 C) (Oral)   Ht 5\' 8"  (1.727 m)   Wt 177 lb 1.6 oz (80.3 kg)   BMI 26.93 kg/m  GENERAL: The patient is AO x3, in no acute distress. HEENT: Head is normocephalic and atraumatic. EOMI are intact. Mouth is well hydrated and without lesions. NECK: Supple. No masses LUNGS: Clear to auscultation. No presence of rhonchi/wheezing/rales. Adequate chest expansion HEART: RRR, normal s1 and s2. ABDOMEN: Soft, nontender, no guarding, no peritoneal signs, and nondistended. BS +. No masses. EXTREMITIES: Without any cyanosis, clubbing, rash, lesions or edema. NEUROLOGIC: AOx3, no focal motor deficit. SKIN: no jaundice, no rashes   Imaging/Labs: as above     Latest Ref Rng & Units 12/31/2022    8:31 AM 01/07/2014   12:50 PM 07/02/2013   10:36 AM  CBC  WBC 3.4 - 10.8 x10E3/uL 8.9  11.5  8.9   Hemoglobin 11.1 - 15.9 g/dL 16.1  09.6  04.5   Hematocrit 34.0 - 46.6 %  44.5  43.2  43.2   Platelets 150 - 450 x10E3/uL 385  371  388    No results found for: "IRON", "TIBC", "FERRITIN"  I personally reviewed and interpreted the available labs, imaging and endoscopic files.  Impression and Plan:  Nichole [name] is a 52 y.o. female with bipolar , COPD with asthma, tobacco use and  migraine headaches, family history of Colon cancer  who presents for evaluation of Dysphagia and now with positive Cologuard   #Positive Cologuard  Patient does have any high risk factors for colorectal cancer malignancy given heavy smoking and family history of Colon cancer   She has been asymptomatic. Discussed cologuard results in detail, specifically what it means when the test is positive or negative.  Discussed that there is a possibility that even when the test is positive there may not be a polyp found on colonoscopy. More than 50% of  the office visit was dedicated to discussing the procedure, including the day of and risks involved. Patient understands what the procedure involves including the benefits and any risks. Patient understands alternatives to the proposed procedure. Risks including (but not limited to) bleeding, tearing of the lining (perforation), rupture of adjacent organs, problems with heart and lung function, infection, and medication reactions. A small percentage of complications may require surgery, hospitalization, repeat endoscopic procedure, and/or transfusion. A small percentage of polyps and other tumors may not be seen.  - Schedule colonoscopy  #Dysphagia       Patient is an active tobacco smoker with solid and liquid food dysphagia it is imperative to rule out malignancy.  This also could be due to rings or stricture  Recent blood work with hemoglobin 15.3 platelets 387 normal liver enzymes hep C negative  Will proceed with upper endoscopy with esophageal biopsies plus and minus dilation if any stricture is encountered  Aspiration precautions - Cut  food in small pieces and chew food thoroughly to avoid regurgitation episodes. - Discussed avoidance of eating within 3 hours of lying down to sleep and benefit of blocks to elevate head of bed. Also, will benefit from avoiding carbonated drinks/sodas or food that has tomatoes, spicy or greasy food.   #Family history of colon cancer Patient has a brother with colon cancer diagnosed at age 62 which puts patient at high risk for colorectal cancer given first-degree relative diagnosed with colon cancer age less than 29  Ideal modality for screening for colon cancer for this patient is colonoscopy, patient is cologaurd which is positive now , hence colonoscopy is indicated.  #Tobacco dependence Patient is active tobacco smoker, had extensive discussion regarding family history of multiple cancers and tobacco being a carcinogen.  She reports on actively trying to quit smoking  All questions were answered.      Nichole Lawman, MD Gastroenterology and Hepatology [facility] Gastroenterology   This chart has been completed using [facility] Dictation software, and while attempts have been made to ensure accuracy , certain words and phrases may not be transcribed as intended

## 2023-01-18 NOTE — Patient Instructions (Signed)
It was very nice to meet you today, as dicussed with will plan for the following :  1) Colonoscopy and Upper endoscopy

## 2023-01-18 NOTE — Progress Notes (Signed)
Vista Lawman , M.D. Gastroenterology & Hepatology [facility] Gastroenterology [address] [place], [place] [address] Primary Care Physician: Gilmore Laroche, FNP [address] [place] [address]  Chief Complaint: Dysphagia  History of Present Illness:  Nichole [name] is a 52 y.o. female with bipolar , COPD with asthma, tobacco use and  migraine headaches, family history of Colon cancer  who presents for evaluation of Dysphagia and now with positive Cologuard   Patient reports that for past 1 year she has solid and liquid food dysphagia and reports as if there is a knot in the middle of her chest.  She feels as if solid food is sitting in the middle of her chest and she would regurgitate food once in a while.  Patient denies weight loss.  Patient is a current diabetes tobacco smoker  Reports 1 soft bowel movement daily and denies constipation or hematochezia  She had a brother was diagnosed with colon cancer at age 46  Had a positive Cologuard 01/06/2023  The patient denies having any fever, chills, hematochezia, melena, hematemesis, abdominal distention, abdominal pain, diarrhea, jaundice, pruritus or weight loss.  Last RJJ:OACZ Last Colonoscopy:none  FHx: Brother diagnosed with colon cancer at age 59 Social: Current heavy smoker, no alcohol or illicit drug use Surgical: C-section  Recent blood work with hemoglobin 15.3 platelets 387 liver enzymes hep C negative  Past Medical History: Past Medical History:  Diagnosis Date   Arthritis    Asthma    Bipolar 1 disorder (HCC)    COPD (chronic obstructive pulmonary disease) (HCC)    COPD (chronic obstructive pulmonary disease) (HCC)    Heart attack (HCC)    Manic depression (HCC)    Panic attacks     Past Surgical History: Past Surgical History:  Procedure Laterality Date   CESAREAN SECTION     cyst removed from wrist     TONSILLECTOMY     TUBAL LIGATION      Family  History: Family History  Problem Relation Age of Onset   Heart disease Mother    Mental illness Mother    Hypertension Mother    Lung disease Father    Heart disease Father    Lung cancer Father    Cancer Brother    Colon cancer Brother    Skin cancer Brother    Lung cancer Brother    Breast cancer Maternal Grandmother    Thyroid cancer Maternal Grandmother     Social History: Social History   Tobacco Use  Smoking Status Every Day   Current packs/day: 1.00   Types: Cigarettes   Passive exposure: Current  Smokeless Tobacco Never   Social History   Substance and Sexual Activity  Alcohol Use Yes   Comment: occ   Social History   Substance and Sexual Activity  Drug Use No    Allergies: Allergies  Allergen Reactions   Bee Venom Anaphylaxis and Hives   Alka-Seltzer [Aspirin Effervescent]    Percocet [Oxycodone-Acetaminophen]     headache    Medications: Current Outpatient Medications  Medication Sig Dispense Refill   albuterol (VENTOLIN HFA) 108 (90 Base) MCG/ACT inhaler Inhale 2 puffs into the lungs every 6 (six) hours as needed for wheezing or shortness of breath. 18 g 0   Aspirin-Acetaminophen-Caffeine (GOODY HEADACHE PO) Take by mouth.     Budeson-Glycopyrrol-Formoterol (BREZTRI AEROSPHERE) 160-9-4.8 MCG/ACT AERO Inhale 2 puffs into the lungs 2 (two) times daily. 10.7 g 11   diphenhydrAMINE (  BENADRYL) 25 mg capsule Take 25 mg by mouth as needed for itching.     EPINEPHrine (EPIPEN 2-PAK) 0.3 mg/0.3 mL IJ SOAJ injection Inject 0.3 mg into the muscle as needed for anaphylaxis. 1 each 0   Rimegepant Sulfate (NURTEC) 75 MG TBDP Take 1 tablet (75 mg total) by mouth as needed. 16 tablet 0   rosuvastatin (CRESTOR) 10 MG tablet Take 1 tablet (10 mg total) by mouth daily. 90 tablet 3   Vitamin D, Ergocalciferol, (DRISDOL) 1.25 MG (50000 UNIT) CAPS capsule Take 1 capsule (50,000 Units total) by mouth every 7 (seven) days. 20 capsule 1   Riboflavin 400 MG CAPS Take 1  capsule (400 mg total) by mouth daily. (Patient not taking: Reported on 01/18/2023) 30 capsule 2   No current facility-administered medications for this visit.    Review of Systems: GENERAL: negative for malaise, night sweats HEENT: No changes in hearing or vision, no nose bleeds or other nasal problems. NECK: Negative for lumps, goiter, pain and significant neck swelling RESPIRATORY: Negative for cough, wheezing CARDIOVASCULAR: Negative for chest pain, leg swelling, palpitations, orthopnea GI: SEE HPI MUSCULOSKELETAL: Negative for joint pain or swelling, back pain, and muscle pain. SKIN: Negative for lesions, rash HEMATOLOGY Negative for prolonged bleeding, bruising easily, and swollen nodes. ENDOCRINE: Negative for cold or heat intolerance, polyuria, polydipsia and goiter. NEURO: negative for tremor, gait imbalance, syncope and seizures. The remainder of the review of systems is noncontributory.   Physical Exam: BP 129/75 (BP Location: Right Arm, Patient Position: Sitting, Cuff Size: Large)   Pulse (!) 108   Temp 98.3 F (36.8 C) (Oral)   Ht 5\' 8"  (1.727 m)   Wt 177 lb 1.6 oz (80.3 kg)   BMI 26.93 kg/m  GENERAL: The patient is AO x3, in no acute distress. HEENT: Head is normocephalic and atraumatic. EOMI are intact. Mouth is well hydrated and without lesions. NECK: Supple. No masses LUNGS: Clear to auscultation. No presence of rhonchi/wheezing/rales. Adequate chest expansion HEART: RRR, normal s1 and s2. ABDOMEN: Soft, nontender, no guarding, no peritoneal signs, and nondistended. BS +. No masses. EXTREMITIES: Without any cyanosis, clubbing, rash, lesions or edema. NEUROLOGIC: AOx3, no focal motor deficit. SKIN: no jaundice, no rashes   Imaging/Labs: as above     Latest Ref Rng & Units 12/31/2022    8:31 AM 01/07/2014   12:50 PM 07/02/2013   10:36 AM  CBC  WBC 3.4 - 10.8 x10E3/uL 8.9  11.5  8.9   Hemoglobin 11.1 - 15.9 g/dL 16.1  09.6  04.5   Hematocrit 34.0 - 46.6 %  44.5  43.2  43.2   Platelets 150 - 450 x10E3/uL 385  371  388    No results found for: "IRON", "TIBC", "FERRITIN"  I personally reviewed and interpreted the available labs, imaging and endoscopic files.  Impression and Plan:  Nichole [name] is a 52 y.o. female with bipolar , COPD with asthma, tobacco use and  migraine headaches, family history of Colon cancer  who presents for evaluation of Dysphagia and now with positive Cologuard   #Positive Cologuard  Patient does have any high risk factors for colorectal cancer malignancy given heavy smoking and family history of Colon cancer   She has been asymptomatic. Discussed cologuard results in detail, specifically what it means when the test is positive or negative.  Discussed that there is a possibility that even when the test is positive there may not be a polyp found on colonoscopy. More than 50% of  the office visit was dedicated to discussing the procedure, including the day of and risks involved. Patient understands what the procedure involves including the benefits and any risks. Patient understands alternatives to the proposed procedure. Risks including (but not limited to) bleeding, tearing of the lining (perforation), rupture of adjacent organs, problems with heart and lung function, infection, and medication reactions. A small percentage of complications may require surgery, hospitalization, repeat endoscopic procedure, and/or transfusion. A small percentage of polyps and other tumors may not be seen.  - Schedule colonoscopy  #Dysphagia       Patient is an active tobacco smoker with solid and liquid food dysphagia it is imperative to rule out malignancy.  This also could be due to rings or stricture  Recent blood work with hemoglobin 15.3 platelets 387 normal liver enzymes hep C negative  Will proceed with upper endoscopy with esophageal biopsies plus and minus dilation if any stricture is encountered  Aspiration precautions - Cut  food in small pieces and chew food thoroughly to avoid regurgitation episodes. - Discussed avoidance of eating within 3 hours of lying down to sleep and benefit of blocks to elevate head of bed. Also, will benefit from avoiding carbonated drinks/sodas or food that has tomatoes, spicy or greasy food.   #Family history of colon cancer Patient has a brother with colon cancer diagnosed at age 62 which puts patient at high risk for colorectal cancer given first-degree relative diagnosed with colon cancer age less than 29  Ideal modality for screening for colon cancer for this patient is colonoscopy, patient is cologaurd which is positive now , hence colonoscopy is indicated.  #Tobacco dependence Patient is active tobacco smoker, had extensive discussion regarding family history of multiple cancers and tobacco being a carcinogen.  She reports on actively trying to quit smoking  All questions were answered.      Vista Lawman, MD Gastroenterology and Hepatology [facility] Gastroenterology   This chart has been completed using [facility] Dictation software, and while attempts have been made to ensure accuracy , certain words and phrases may not be transcribed as intended

## 2023-01-18 NOTE — Addendum Note (Signed)
Addended by: Marlowe Shores on: 01/18/2023 10:29 AM   Modules accepted: Orders

## 2023-01-19 ENCOUNTER — Telehealth (INDEPENDENT_AMBULATORY_CARE_PROVIDER_SITE_OTHER): Payer: Self-pay | Admitting: Gastroenterology

## 2023-01-19 ENCOUNTER — Encounter (INDEPENDENT_AMBULATORY_CARE_PROVIDER_SITE_OTHER): Payer: Self-pay

## 2023-01-19 NOTE — Telephone Encounter (Signed)
Letter mailed to pt with pre op appt information

## 2023-01-31 ENCOUNTER — Encounter: Payer: Self-pay | Admitting: Family Medicine

## 2023-01-31 ENCOUNTER — Ambulatory Visit: Payer: Medicaid Other | Admitting: Family Medicine

## 2023-01-31 ENCOUNTER — Other Ambulatory Visit (HOSPITAL_COMMUNITY)
Admission: RE | Admit: 2023-01-31 | Discharge: 2023-01-31 | Disposition: A | Discharge status: Home / Self Care | Payer: Medicaid Other | Source: Ambulatory Visit | Attending: Family Medicine | Admitting: Family Medicine

## 2023-01-31 VITALS — BP 138/86 | HR 82 | Ht 68.0 in | Wt 176.0 lb

## 2023-01-31 DIAGNOSIS — Z124 Encounter for screening for malignant neoplasm of cervix: Secondary | ICD-10-CM | POA: Diagnosis not present

## 2023-01-31 NOTE — Patient Instructions (Addendum)
I appreciate the opportunity to provide care to you today!    Follow up:  4 months   Attached with your AVS, you will find valuable resources for self-education. I highly recommend dedicating some time to thoroughly examine them.   Please continue to a heart-healthy diet and increase your physical activities. Try to exercise for at least five days a week.    It was a pleasure to see you and I look forward to continuing to work together on your health and well-being. Please do not hesitate to call the office if you need care or have questions about your care.  In case of emergency, please visit the Emergency Department for urgent care, or contact our clinic at [phone] to schedule an appointment. We're here to help you!   Have a wonderful day and week. With Gratitude, Gilmore Laroche MSN, FNP-BC

## 2023-01-31 NOTE — Progress Notes (Signed)
Acute Office Visit  Subjective:    Patient ID: Nichole [name], female    DOB: 1970/08/30, 52 y.o.   MRN: [id number]  No chief complaint on file.   HPI Patient is in today for pap smear.  Past Medical History:  Diagnosis Date   Arthritis    Asthma    Bipolar 1 disorder (HCC)    COPD (chronic obstructive pulmonary disease) (HCC)    COPD (chronic obstructive pulmonary disease) (HCC)    Heart attack (HCC)    Manic depression (HCC)    Panic attacks     Past Surgical History:  Procedure Laterality Date   CESAREAN SECTION     cyst removed from wrist     TONSILLECTOMY     TUBAL LIGATION      Family History  Problem Relation Age of Onset   Heart disease Mother    Mental illness Mother    Hypertension Mother    Lung disease Father    Heart disease Father    Lung cancer Father    Cancer Brother    Colon cancer Brother    Skin cancer Brother    Lung cancer Brother    Breast cancer Maternal Grandmother    Thyroid cancer Maternal Grandmother     Social History   Socioeconomic History   Marital status: Married    Spouse name: Not on file   Number of children: Not on file   Years of education: Not on file   Highest education level: Not on file  Occupational History   Not on file  Tobacco Use   Smoking status: Every Day    Current packs/day: 1.00    Types: Cigarettes    Passive exposure: Current   Smokeless tobacco: Never  Substance and Sexual Activity   Alcohol use: Yes    Comment: occ   Drug use: No   Sexual activity: Yes  Other Topics Concern   Not on file  Social History Narrative   Not on file   Social Determinants of Health   Financial Resource Strain: Not on file  Food Insecurity: Not on file  Transportation Needs: Not on file  Physical Activity: Not on file  Stress: Not on file  Social Connections: Not on file  Intimate Partner Violence: Not on file    Outpatient Medications Prior to Visit  Medication Sig Dispense Refill   albuterol  (VENTOLIN HFA) 108 (90 Base) MCG/ACT inhaler Inhale 2 puffs into the lungs every 6 (six) hours as needed for wheezing or shortness of breath. 18 g 0   Aspirin-Acetaminophen-Caffeine (GOODY HEADACHE PO) Take by mouth.     Budeson-Glycopyrrol-Formoterol (BREZTRI AEROSPHERE) 160-9-4.8 MCG/ACT AERO Inhale 2 puffs into the lungs 2 (two) times daily. 10.7 g 11   diphenhydrAMINE (BENADRYL) 25 mg capsule Take 25 mg by mouth as needed for itching.     EPINEPHrine (EPIPEN 2-PAK) 0.3 mg/0.3 mL IJ SOAJ injection Inject 0.3 mg into the muscle as needed for anaphylaxis. 1 each 0   Riboflavin 400 MG CAPS Take 1 capsule (400 mg total) by mouth daily. 30 capsule 2   Rimegepant Sulfate (NURTEC) 75 MG TBDP Take 1 tablet (75 mg total) by mouth as needed. 16 tablet 0   rosuvastatin (CRESTOR) 10 MG tablet Take 1 tablet (10 mg total) by mouth daily. 90 tablet 3   Vitamin D, Ergocalciferol, (DRISDOL) 1.25 MG (50000 UNIT) CAPS capsule Take 1 capsule (50,000 Units total) by mouth every 7 (seven) days. 20 capsule 1  No facility-administered medications prior to visit.    Allergies  Allergen Reactions   Bee Venom Anaphylaxis and Hives   Alka-Seltzer [Aspirin Effervescent]    Percocet [Oxycodone-Acetaminophen]     headache    Review of Systems  Constitutional:  Negative for chills and fever.  Eyes:  Negative for visual disturbance.  Respiratory:  Negative for chest tightness and shortness of breath.   Genitourinary:  Negative for hematuria, vaginal discharge and vaginal pain.  Neurological:  Negative for dizziness and headaches.       Objective:    Physical Exam HENT:     Head: Normocephalic.     Mouth/Throat:     Mouth: Mucous membranes are moist.  Cardiovascular:     Rate and Rhythm: Normal rate.     Heart sounds: Normal heart sounds.  Pulmonary:     Effort: Pulmonary effort is normal.     Breath sounds: Normal breath sounds.  Genitourinary:    General: Normal vulva.     Exam position: Lithotomy  position.     Comments: Vaginal wall: pink and rugated, smooth and non-tender; absence of lesions, edema, and erythema. Labia Majora and Minora: present bilaterally, moist, soft tissue, and homogeneous; free of edema and ulcerations. Clitoris is anatomically present, above the urethral, and free of lesions, masses, and ulceration.   Neurological:     Mental Status: She is alert.     BP 138/86   Pulse 82   Ht 5\' 8"  (1.727 m)   Wt 176 lb 0.6 oz (79.9 kg)   SpO2 93%   BMI 26.77 kg/m  Wt Readings from Last 3 Encounters:  01/31/23 176 lb 0.6 oz (79.9 kg)  01/18/23 177 lb 1.6 oz (80.3 kg)  01/05/23 174 lb 1.6 oz (79 kg)       Assessment & Plan:  Cervical cancer screening Assessment & Plan: -Cytology and HPV co-testing (preferred) every 5 years or cytology alone (acceptable) every 3 years. - Pap due 2029    Orders: -     Cytology - PAP  Note: This chart has been completed using [employer] [facility] Dictation software, and while attempts have been made to ensure accuracy, certain words and phrases may not be transcribed as intended.    Gilmore Laroche, FNP

## 2023-01-31 NOTE — Assessment & Plan Note (Signed)
Cytology and HPV co-testing (preferred) every 5 years or cytology alone (acceptable) every 3 years. - Pap due 2029   

## 2023-02-03 LAB — CYTOLOGY - PAP
Adequacy: ABSENT
Comment: NEGATIVE
Diagnosis: NEGATIVE
High risk HPV: NEGATIVE

## 2023-02-03 NOTE — Progress Notes (Signed)
Your Pap examination was negative for intraepithelial lesion or malignancy this means that there were no signs of precancerous changes or cancer were found in the cervical cells sampled during the exam; this is a normal result indicating the cervical cells are healthy and there are no indications of cervical cancer or precancer conditions.

## 2023-02-04 NOTE — Patient Instructions (Addendum)
Nichole [name]  02/04/2023     @PREFPERIOPPHARMACY @   Your procedure is scheduled on  02/10/2023.   Report to [facility] at  0600  A.M.   Call this number if you have problems the morning of surgery:  [phone]  If you experience any cold or flu symptoms such as cough, fever, chills, shortness of breath, etc. between now and your scheduled surgery, please notify us at the above number.   Remember:  Follow the diet and prep instructions given to you by the office.      Take these medicines the morning of surgery with A SIP OF WATER                                        nurtec (if needed).     Do not wear jewelry, make-up or nail polish, including gel polish,  artificial nails, or any other type of covering on natural nails (fingers and  toes).  Do not wear lotions, powders, or perfumes, or deodorant.  Do not shave 48 hours prior to surgery.  Men may shave face and neck.  Do not bring valuables to the hospital.  [facility] is not responsible for any belongings or valuables.  Contacts, dentures or bridgework may not be worn into surgery.  Leave your suitcase in the car.  After surgery it may be brought to your room.  For patients admitted to the hospital, discharge time will be determined by your treatment team.  Patients discharged the day of surgery will not be allowed to drive home and must have someone with them for 24 hours.    Special instructions:   DO NOT smoke tobacco or vape for 24 hours before your procedure.  Please read over the following fact sheets that you were given. Anesthesia Post-op Instructions and Care and Recovery After Surgery        Upper Endoscopy, Adult, Care After After the procedure, it is common to have a sore throat. It is also common to have: Mild stomach pain or discomfort. Bloating. Nausea. Follow these instructions at home: The instructions below may help you care for yourself at home. Your health care provider  may give you more instructions. If you have questions, ask your health care provider. If you were given a sedative during the procedure, it can affect you for several hours. Do not drive or operate machinery until your health care provider says that it is safe. If you will be going home right after the procedure, plan to have a responsible adult: Take you home from the hospital or clinic. You will not be allowed to drive. Care for you for the time you are told. Follow instructions from your health care provider about what you may eat and drink. Return to your normal activities as told by your health care provider. Ask your health care provider what activities are safe for you. Take over-the-counter and prescription medicines only as told by your health care provider. Contact a health care provider if you: Have a sore throat that lasts longer than one day. Have trouble swallowing. Have a fever. Get help right away if you: Vomit blood or your vomit looks like coffee grounds. Have bloody, black, or tarry stools. Have a very bad sore throat or you cannot swallow. Have difficulty breathing or very bad pain in your chest or  abdomen. These symptoms may be an emergency. Get help right away. Call 911. Do not wait to see if the symptoms will go away. Do not drive yourself to the hospital. Summary After the procedure, it is common to have a sore throat, mild stomach discomfort, bloating, and nausea. If you were given a sedative during the procedure, it can affect you for several hours. Do not drive until your health care provider says that it is safe. Follow instructions from your health care provider about what you may eat and drink. Return to your normal activities as told by your health care provider. This information is not intended to replace advice given to you by your health care provider. Make sure you discuss any questions you have with your health care provider. Document Revised: 07/29/2021  Document Reviewed: 07/29/2021 Elsevier Patient Education  2024 [employer] Colonoscopy, Adult, Care After The following information offers guidance on how to care for yourself after your procedure. Your health care provider may also give you more specific instructions. If you have problems or questions, contact your health care provider. What can I expect after the procedure? After the procedure, it is common to have: A small amount of blood in your stool for 24 hours after the procedure. Some gas. Mild cramping or bloating of your abdomen. Follow these instructions at home: Eating and drinking  Drink enough fluid to keep your urine pale yellow. Follow instructions from your health care provider about eating or drinking restrictions. Resume your normal diet as told by your health care provider. Avoid heavy or fried foods that are hard to digest. Activity Rest as told by your health care provider. Avoid sitting for a long time without moving. Get up to take short walks every 1-2 hours. This is important to improve blood flow and breathing. Ask for help if you feel weak or unsteady. Return to your normal activities as told by your health care provider. Ask your health care provider what activities are safe for you. Managing cramping and bloating  Try walking around when you have cramps or feel bloated. If directed, apply heat to your abdomen as told by your health care provider. Use the heat source that your health care provider recommends, such as a moist heat pack or a heating pad. Place a towel between your skin and the heat source. Leave the heat on for 20-30 minutes. Remove the heat if your skin turns bright red. This is especially important if you are unable to feel pain, heat, or cold. You have a greater risk of getting burned. General instructions If you were given a sedative during the procedure, it can affect you for several hours. Do not drive or operate machinery until your  health care provider says that it is safe. For the first 24 hours after the procedure: Do not sign important documents. Do not drink alcohol. Do your regular daily activities at a slower pace than normal. Eat soft foods that are easy to digest. Take over-the-counter and prescription medicines only as told by your health care provider. Keep all follow-up visits. This is important. Contact a health care provider if: You have blood in your stool 2-3 days after the procedure. Get help right away if: You have more than a small spotting of blood in your stool. You have large blood clots in your stool. You have swelling of your abdomen. You have nausea or vomiting. You have a fever. You have increasing pain in your abdomen that is not relieved with  medicine. These symptoms may be an emergency. Get help right away. Call 911. Do not wait to see if the symptoms will go away. Do not drive yourself to the hospital. Summary After the procedure, it is common to have a small amount of blood in your stool. You may also have mild cramping and bloating of your abdomen. If you were given a sedative during the procedure, it can affect you for several hours. Do not drive or operate machinery until your health care provider says that it is safe. Get help right away if you have a lot of blood in your stool, nausea or vomiting, a fever, or increased pain in your abdomen. This information is not intended to replace advice given to you by your health care provider. Make sure you discuss any questions you have with your health care provider. Document Revised: 06/01/2022 Document Reviewed: 12/10/2020 Elsevier Patient Education  2024 [employer]. Monitored Anesthesia Care, Care After The following information offers guidance on how to care for yourself after your procedure. Your health care provider may also give you more specific instructions. If you have problems or questions, contact your health care  provider. What can I expect after the procedure? After the procedure, it is common to have: Tiredness. Little or no memory about what happened during or after the procedure. Impaired judgment when it comes to making decisions. Nausea or vomiting. Some trouble with balance. Follow these instructions at home: For the time period you were told by your health care provider:  Rest. Do not participate in activities where you could fall or become injured. Do not drive or use machinery. Do not drink alcohol. Do not take sleeping pills or medicines that cause drowsiness. Do not make important decisions or sign legal documents. Do not take care of children on your own. Medicines Take over-the-counter and prescription medicines only as told by your health care provider. If you were prescribed antibiotics, take them as told by your health care provider. Do not stop using the antibiotic even if you start to feel better. Eating and drinking Follow instructions from your health care provider about what you may eat and drink. Drink enough fluid to keep your urine pale yellow. If you vomit: Drink clear fluids slowly and in small amounts as you are able. Clear fluids include water, ice chips, low-calorie sports drinks, and fruit juice that has water added to it (diluted fruit juice). Eat light and bland foods in small amounts as you are able. These foods include bananas, applesauce, rice, lean meats, toast, and crackers. General instructions  Have a responsible adult stay with you for the time you are told. It is important to have someone help care for you until you are awake and alert. If you have sleep apnea, surgery and some medicines can increase your risk for breathing problems. Follow instructions from your health care provider about wearing your sleep device: When you are sleeping. This includes during daytime naps. While taking prescription pain medicines, sleeping medicines, or medicines that  make you drowsy. Do not use any products that contain nicotine or tobacco. These products include cigarettes, chewing tobacco, and vaping devices, such as e-cigarettes. If you need help quitting, ask your health care provider. Contact a health care provider if: You feel nauseous or vomit every time you eat or drink. You feel light-headed. You are still sleepy or having trouble with balance after 24 hours. You get a rash. You have a fever. You have redness or swelling around the  IV site. Get help right away if: You have trouble breathing. You have new confusion after you get home. These symptoms may be an emergency. Get help right away. Call 911. Do not wait to see if the symptoms will go away. Do not drive yourself to the hospital. This information is not intended to replace advice given to you by your health care provider. Make sure you discuss any questions you have with your health care provider. Document Revised: 09/14/2021 Document Reviewed: 09/14/2021 Elsevier Patient Education  2024 [employer].

## 2023-02-08 ENCOUNTER — Encounter (HOSPITAL_COMMUNITY)
Admission: RE | Admit: 2023-02-08 | Discharge: 2023-02-08 | Disposition: A | Discharge status: Home / Self Care | Payer: Medicaid Other | Source: Ambulatory Visit | Attending: Gastroenterology | Admitting: Gastroenterology

## 2023-02-08 ENCOUNTER — Encounter (HOSPITAL_COMMUNITY): Payer: Self-pay

## 2023-02-08 VITALS — BP 138/86 | HR 82 | Temp 98.3°F | Resp 18 | Ht 68.0 in | Wt 176.0 lb

## 2023-02-08 DIAGNOSIS — Z01818 Encounter for other preprocedural examination: Secondary | ICD-10-CM | POA: Insufficient documentation

## 2023-02-08 DIAGNOSIS — F17210 Nicotine dependence, cigarettes, uncomplicated: Secondary | ICD-10-CM | POA: Insufficient documentation

## 2023-02-08 DIAGNOSIS — F17200 Nicotine dependence, unspecified, uncomplicated: Secondary | ICD-10-CM

## 2023-02-08 DIAGNOSIS — Z720 Tobacco use: Secondary | ICD-10-CM

## 2023-02-08 HISTORY — DX: Sciatica, unspecified side: M54.30

## 2023-02-08 LAB — POCT PREGNANCY, URINE: Preg Test, Ur: NEGATIVE

## 2023-02-10 ENCOUNTER — Ambulatory Visit (HOSPITAL_COMMUNITY): Payer: Medicaid Other | Admitting: Anesthesiology

## 2023-02-10 ENCOUNTER — Encounter (HOSPITAL_COMMUNITY): Payer: Self-pay

## 2023-02-10 ENCOUNTER — Ambulatory Visit (HOSPITAL_COMMUNITY)
Admission: RE | Admit: 2023-02-10 | Discharge: 2023-02-10 | Disposition: A | Discharge status: Home / Self Care | Payer: Medicaid Other | Attending: Gastroenterology | Admitting: Gastroenterology

## 2023-02-10 ENCOUNTER — Encounter (HOSPITAL_COMMUNITY)
Admission: RE | Disposition: A | Discharge status: Home / Self Care | Payer: Self-pay | Source: Home / Self Care | Attending: Gastroenterology

## 2023-02-10 DIAGNOSIS — J4489 Other specified chronic obstructive pulmonary disease: Secondary | ICD-10-CM | POA: Diagnosis not present

## 2023-02-10 DIAGNOSIS — E119 Type 2 diabetes mellitus without complications: Secondary | ICD-10-CM | POA: Insufficient documentation

## 2023-02-10 DIAGNOSIS — K5730 Diverticulosis of large intestine without perforation or abscess without bleeding: Secondary | ICD-10-CM | POA: Insufficient documentation

## 2023-02-10 DIAGNOSIS — K648 Other hemorrhoids: Secondary | ICD-10-CM | POA: Diagnosis not present

## 2023-02-10 DIAGNOSIS — K2970 Gastritis, unspecified, without bleeding: Secondary | ICD-10-CM

## 2023-02-10 DIAGNOSIS — K644 Residual hemorrhoidal skin tags: Secondary | ICD-10-CM | POA: Diagnosis not present

## 2023-02-10 DIAGNOSIS — F17210 Nicotine dependence, cigarettes, uncomplicated: Secondary | ICD-10-CM | POA: Insufficient documentation

## 2023-02-10 DIAGNOSIS — K209 Esophagitis, unspecified without bleeding: Secondary | ICD-10-CM | POA: Diagnosis not present

## 2023-02-10 DIAGNOSIS — K449 Diaphragmatic hernia without obstruction or gangrene: Secondary | ICD-10-CM | POA: Diagnosis not present

## 2023-02-10 DIAGNOSIS — K635 Polyp of colon: Secondary | ICD-10-CM

## 2023-02-10 DIAGNOSIS — K222 Esophageal obstruction: Secondary | ICD-10-CM | POA: Diagnosis not present

## 2023-02-10 DIAGNOSIS — R1310 Dysphagia, unspecified: Secondary | ICD-10-CM | POA: Diagnosis not present

## 2023-02-10 DIAGNOSIS — R195 Other fecal abnormalities: Secondary | ICD-10-CM | POA: Diagnosis not present

## 2023-02-10 DIAGNOSIS — Z1211 Encounter for screening for malignant neoplasm of colon: Secondary | ICD-10-CM | POA: Insufficient documentation

## 2023-02-10 DIAGNOSIS — Z800 Family history of malignant neoplasm of digestive organs: Secondary | ICD-10-CM | POA: Insufficient documentation

## 2023-02-10 DIAGNOSIS — F413 Other mixed anxiety disorders: Secondary | ICD-10-CM

## 2023-02-10 DIAGNOSIS — K2289 Other specified disease of esophagus: Secondary | ICD-10-CM

## 2023-02-10 DIAGNOSIS — K3189 Other diseases of stomach and duodenum: Secondary | ICD-10-CM | POA: Insufficient documentation

## 2023-02-10 DIAGNOSIS — K2210 Ulcer of esophagus without bleeding: Secondary | ICD-10-CM | POA: Insufficient documentation

## 2023-02-10 DIAGNOSIS — D125 Benign neoplasm of sigmoid colon: Secondary | ICD-10-CM

## 2023-02-10 DIAGNOSIS — K208 Other esophagitis without bleeding: Secondary | ICD-10-CM | POA: Diagnosis not present

## 2023-02-10 HISTORY — PX: ESOPHAGOGASTRODUODENOSCOPY (EGD) WITH PROPOFOL: SHX5813

## 2023-02-10 HISTORY — PX: COLONOSCOPY WITH PROPOFOL: SHX5780

## 2023-02-10 HISTORY — PX: ESOPHAGEAL DILATION: SHX303

## 2023-02-10 HISTORY — PX: POLYPECTOMY: SHX5525

## 2023-02-10 HISTORY — PX: BIOPSY: SHX5522

## 2023-02-10 LAB — HM COLONOSCOPY

## 2023-02-10 SURGERY — COLONOSCOPY WITH PROPOFOL
Anesthesia: General

## 2023-02-10 MED ORDER — PROPOFOL 1000 MG/100ML IV EMUL
INTRAVENOUS | Status: AC
  Filled 2023-02-10: qty 200

## 2023-02-10 MED ORDER — LIDOCAINE HCL (PF) 2 % IJ SOLN
INTRAMUSCULAR | Status: AC
  Filled 2023-02-10: qty 10

## 2023-02-10 MED ORDER — MIDAZOLAM HCL 2 MG/2ML IJ SOLN
INTRAMUSCULAR | Status: AC
  Filled 2023-02-10: qty 2

## 2023-02-10 MED ORDER — PROPOFOL 10 MG/ML IV BOLUS
INTRAVENOUS | Status: DC | PRN
  Administered 2023-02-10: 30 mg via INTRAVENOUS
  Administered 2023-02-10: 20 mg via INTRAVENOUS
  Administered 2023-02-10: 50 mg via INTRAVENOUS

## 2023-02-10 MED ORDER — LIDOCAINE HCL (CARDIAC) PF 100 MG/5ML IV SOSY
PREFILLED_SYRINGE | INTRAVENOUS | Status: DC | PRN
  Administered 2023-02-10: 50 mg via INTRAVENOUS

## 2023-02-10 MED ORDER — MIDAZOLAM HCL 2 MG/2ML IJ SOLN
INTRAMUSCULAR | Status: DC | PRN
Start: 2023-02-10 — End: 2023-02-10
  Administered 2023-02-10: 2 mg via INTRAVENOUS

## 2023-02-10 MED ORDER — PROPOFOL 1000 MG/100ML IV EMUL
INTRAVENOUS | Status: AC
  Filled 2023-02-10: qty 100

## 2023-02-10 MED ORDER — PANTOPRAZOLE SODIUM 40 MG PO TBEC
40.0000 mg | DELAYED_RELEASE_TABLET | Freq: Every day | ORAL | 2 refills | Status: DC
Start: 2023-02-10 — End: 2023-05-10

## 2023-02-10 MED ORDER — PHENYLEPHRINE 80 MCG/ML (10ML) SYRINGE FOR IV PUSH (FOR BLOOD PRESSURE SUPPORT)
PREFILLED_SYRINGE | INTRAVENOUS | Status: AC
  Filled 2023-02-10: qty 10

## 2023-02-10 MED ORDER — PROPOFOL 500 MG/50ML IV EMUL
INTRAVENOUS | Status: DC | PRN
  Administered 2023-02-10: 200 ug/kg/min via INTRAVENOUS

## 2023-02-10 MED ORDER — SODIUM CHLORIDE 0.9% FLUSH
INTRAVENOUS | Status: DC | PRN
Start: 2023-02-10 — End: 2023-02-10
  Administered 2023-02-10: 10 mL via INTRAVENOUS
  Administered 2023-02-10 (×4): 5 mL via INTRAVENOUS
  Administered 2023-02-10 (×2): 10 mL via INTRAVENOUS
  Administered 2023-02-10 (×2): 5 mL via INTRAVENOUS

## 2023-02-10 MED ORDER — PROPOFOL 500 MG/50ML IV EMUL
INTRAVENOUS | Status: AC
  Filled 2023-02-10: qty 150

## 2023-02-10 NOTE — Discharge Instructions (Signed)

## 2023-02-10 NOTE — Transfer of Care (Signed)
Immediate Anesthesia Transfer of Care Note  Patient: AASIA PEAVLER  Procedure(s) Performed: COLONOSCOPY WITH PROPOFOL ESOPHAGOGASTRODUODENOSCOPY (EGD) WITH PROPOFOL BIOPSY ESOPHAGEAL DILATION POLYPECTOMY  Patient Location: Short Stay  Anesthesia Type:General  Level of Consciousness: awake, alert , oriented, and patient cooperative  Airway & Oxygen Therapy: Patient Spontanous Breathing  Post-op Assessment: Report given to RN, Post -op Vital signs reviewed and stable, and Patient moving all extremities X 4  Post vital signs: Reviewed and stable  Last Vitals:  Vitals Value Taken Time  BP 105/63 02/10/23 0833  Temp 36.6 C 02/10/23 0833  Pulse 86 02/10/23 0833  Resp 25 02/10/23 0833  SpO2 99 % 02/10/23 0833    Last Pain:  Vitals:   02/10/23 0833  TempSrc: Axillary  PainSc: 0-No pain         Complications: No notable events documented.

## 2023-02-10 NOTE — Op Note (Signed)
[facility] Patient Name: Nichole [name] Procedure Date: 02/10/2023 7:02 AM MRN: [id number] Date of Birth: Jan 08, 1971 Attending MD: Sanjuan Dame , MD, [id number] CSN: [id number] Age: 52 Admit Type: Outpatient Procedure:                Colonoscopy Indications:              Positive Cologuard test, family history of colon                            cancer Providers:                Sanjuan Dame, MD, Edrick Kins, RN, Francoise Ceo                            RN, RN, Dyann Ruddle Referring MD:              Medicines:                Monitored Anesthesia Care Complications:            No immediate complications. Estimated Blood Loss:     Estimated blood loss was minimal. Procedure:                Pre-Anesthesia Assessment:                           - Prior to the procedure, a History and Physical                            was performed, and patient medications and                            allergies were reviewed. The patient's tolerance of                            previous anesthesia was also reviewed. The risks                            and benefits of the procedure and the sedation                            options and risks were discussed with the patient.                            All questions were answered, and informed consent                            was obtained. Prior Anticoagulants: The patient has                            taken no anticoagulant or antiplatelet agents                            except for aspirin. ASA Grade Assessment: III - A  patient with severe systemic disease. After                            reviewing the risks and benefits, the patient was                            deemed in satisfactory condition to undergo the                            procedure.                           After obtaining informed consent, the colonoscope                            was passed under direct vision. Throughout the                             procedure, the patient's blood pressure, pulse, and                            oxygen saturations were monitored continuously. The                            [phone]) scope was introduced through the                            anus and advanced to the the cecum, identified by                            appendiceal orifice and ileocecal valve. The                            colonoscopy was performed without difficulty. The                            patient tolerated the procedure well. The quality                            of the bowel preparation was evaluated using the                            BBPS [facility] Bowel Preparation Scale) with scores                            of: Right Colon = 2 (minor amount of residual                            staining, small fragments of stool and/or opaque                            liquid, but mucosa seen well), Transverse Colon = 2                            (  minor amount of residual staining, small fragments                            of stool and/or opaque liquid, but mucosa seen                            well) and Left Colon = 2 (minor amount of residual                            staining, small fragments of stool and/or opaque                            liquid, but mucosa seen well). The total BBPS score                            equals 6. The ileocecal valve, appendiceal orifice,                            and rectum were photographed. Scope In: 7:58:00 AM Scope Out: 8:27:55 AM Scope Withdrawal Time: 0 hours 27 minutes 1 second  Total Procedure Duration: 0 hours 29 minutes 55 seconds  Findings:      The perianal and digital rectal examinations were normal.      Six sessile polyps were found in the sigmoid colon. The polyps were 3 to       7 mm in size. These polyps were removed with a cold snare. Resection and       retrieval were complete.      Scattered diverticula were found in the entire colon.      Non-bleeding  external and internal hemorrhoids were found during       retroflexion. Impression:               - Six 3 to 7 mm polyps in the sigmoid colon,                            removed with a cold snare. Resected and retrieved.                           - Diverticulosis in the entire examined colon.                           - Non-bleeding external and internal hemorrhoids. Moderate Sedation:      Per Anesthesia Care Recommendation:           - Patient has a contact number available for                            emergencies. The signs and symptoms of potential                            delayed complications were discussed with the                            patient. Return to normal activities tomorrow.  Written discharge instructions were provided to the                            patient.                           - Resume previous diet.                           - Continue present medications.                           - Await pathology results.                           - Repeat colonoscopy in 3 - 5 years for                            surveillance based on pathology results.                           - Return to primary care physician as previously                            scheduled. Procedure Code(s):        --- Professional ---                           [phone], Colonoscopy, flexible; with removal of                            tumor(s), polyp(s), or other lesion(s) by snare                            technique Diagnosis Code(s):        --- Professional ---                           D12.5, Benign neoplasm of sigmoid colon                           K64.8, Other hemorrhoids                           R19.5, Other fecal abnormalities                           K57.30, Diverticulosis of large intestine without                            perforation or abscess without bleeding CPT copyright 2022 American Medical Association. All rights reserved. The codes  documented in this report are preliminary and upon coder review may  be revised to meet current compliance requirements. Sanjuan Dame, MD Sanjuan Dame, MD 02/10/2023 8:40:28 AM This report has been signed electronically. Number of Addenda: 0

## 2023-02-10 NOTE — Anesthesia Postprocedure Evaluation (Signed)
Anesthesia Post Note  Patient: Nichole [name]  Procedure(s) Performed: COLONOSCOPY WITH PROPOFOL ESOPHAGOGASTRODUODENOSCOPY (EGD) WITH PROPOFOL BIOPSY ESOPHAGEAL DILATION POLYPECTOMY  Patient location during evaluation: Phase II Anesthesia Type: General Level of consciousness: awake Pain management: pain level controlled Vital Signs Assessment: post-procedure vital signs reviewed and stable Respiratory status: spontaneous breathing and respiratory function stable Cardiovascular status: blood pressure returned to baseline and stable Postop Assessment: no headache and no apparent nausea or vomiting Anesthetic complications: no Comments: Late entry   No notable events documented.   Last Vitals:  Vitals:   02/10/23 0632 02/10/23 0833  BP: (!) 138/93 105/63  Pulse: (!) 102 86  Resp: 14 (!) 25  Temp: 36.6 C 36.6 C  SpO2: 100% 99%    Last Pain:  Vitals:   02/10/23 0833  TempSrc: Axillary  PainSc: 0-No pain                 Windell Norfolk

## 2023-02-10 NOTE — Interval H&P Note (Signed)
History and Physical Interval Note:  02/10/2023 7:34 AM  Nichole [name]  has presented today for surgery, with the diagnosis of dysphagia, positive cologuard.  The various methods of treatment have been discussed with the patient and family. After consideration of risks, benefits and other options for treatment, the patient has consented to  Procedure(s) with comments: COLONOSCOPY WITH PROPOFOL (N/A) - 7:30am;asa 3 ESOPHAGOGASTRODUODENOSCOPY (EGD) WITH PROPOFOL (N/A) - 7:30am;asa 3 as a surgical intervention.  The patient's history has been reviewed, patient examined, no change in status, stable for surgery.  I have reviewed the patient's chart and labs.  Questions were answered to the patient's satisfaction.     Juanetta Beets [name]

## 2023-02-10 NOTE — Op Note (Signed)
[facility] Patient Name: Nichole [name] Procedure Date: 02/10/2023 7:10 AM MRN: [id number] Date of Birth: 04/08/1971 Attending MD: Sanjuan Dame , MD, [id number] CSN: [id number] Age: 53 Admit Type: Outpatient Procedure:                Upper GI endoscopy Indications:              Dysphagia Providers:                Sanjuan Dame, MD, Edrick Kins, RN, Francoise Ceo                            RN, RN, Dyann Ruddle Referring MD:              Medicines:                Monitored Anesthesia Care Complications:            No immediate complications. Estimated Blood Loss:     Estimated blood loss was minimal. Procedure:                Pre-Anesthesia Assessment:                           - Prior to the procedure, a History and Physical                            was performed, and patient medications and                            allergies were reviewed. The patient's tolerance of                            previous anesthesia was also reviewed. The risks                            and benefits of the procedure and the sedation                            options and risks were discussed with the patient.                            All questions were answered, and informed consent                            was obtained. Prior Anticoagulants: The patient has                            taken no anticoagulant or antiplatelet agents                            except for aspirin. ASA Grade Assessment: III - A                            patient with severe systemic disease. After  reviewing the risks and benefits, the patient was                            deemed in satisfactory condition to undergo the                            procedure.                           After obtaining informed consent, the endoscope was                            passed under direct vision. Throughout the                            procedure, the patient's blood pressure, pulse, and                             oxygen saturations were monitored continuously. The                            [id number]-H190 ([id number]) scope was introduced through the                            mouth, and advanced to the second part of duodenum.                            The upper GI endoscopy was accomplished without                            difficulty. The patient tolerated the procedure                            well. Scope In: 7:41:49 AM Scope Out: 7:52:56 AM Total Procedure Duration: 0 hours 11 minutes 7 seconds  Findings:      LA Grade B (one or more mucosal breaks greater than 5 mm, not extending       between the tops of two mucosal folds) esophagitis with no bleeding was       found in the lower third of the esophagus.      Mucosal changes characterized by nodularity were found at the       gastroesophageal junction. Biopsies were taken with a cold forceps for       histology.      A low-grade of narrowing Schatzki ring was found in the lower third of       the esophagus. A TTS dilator was passed through the scope. Dilation with       a 15-16.5-18 mm balloon dilator was performed to 18 mm. The dilation       site was examined following endoscope reinsertion and showed moderate       mucosal disruption and complete resolution of luminal narrowing.      A 2 cm hiatal hernia was present.      The gastroesophageal flap valve was visualized endoscopically and       classified as Hill Grade III (minimal fold, loose to endoscope,  hiatal       hernia likely).      Mildly erythematous mucosa without bleeding was found in the entire       examined stomach. Biopsies were taken with a cold forceps for histology.      The duodenal bulb and second portion of the duodenum were normal. Impression:               - LA Grade B erosive esophagitis with no bleeding.                           - Nodular mucosa in the esophagus. Biopsied.                           - Low-grade of narrowing Schatzki ring.  Dilated.                           - 2 cm hiatal hernia.                           - Gastroesophageal flap valve classified as Hill                            Grade III (minimal fold, loose to endoscope, hiatal                            hernia likely).                           - Erythematous mucosa in the stomach. Biopsied.                           - Normal duodenal bulb and second portion of the                            duodenum. Moderate Sedation:      Per Anesthesia Care Recommendation:           - Patient has a contact number available for                            emergencies. The signs and symptoms of potential                            delayed complications were discussed with the                            patient. Return to normal activities tomorrow.                            Written discharge instructions were provided to the                            patient.                           - Resume previous diet.                           -  Continue present medications.                           - Await pathology results.                           -PPI 40 daily Procedure Code(s):        --- Professional ---                           [phone], Esophagogastroduodenoscopy, flexible,                            transoral; with transendoscopic balloon dilation of                            esophagus (less than 30 mm diameter)                           43239, 59, Esophagogastroduodenoscopy, flexible,                            transoral; with biopsy, single or multiple Diagnosis Code(s):        --- Professional ---                           K20.80, Other esophagitis without bleeding                           K22.89, Other specified disease of esophagus                           K31.89, Other diseases of stomach and duodenum                           K22.2, Esophageal obstruction                           R13.10, Dysphagia, unspecified CPT copyright 2022 American Medical  Association. All rights reserved. The codes documented in this report are preliminary and upon coder review may  be revised to meet current compliance requirements. Sanjuan Dame, MD Sanjuan Dame, MD 02/10/2023 8:36:13 AM This report has been signed electronically. Number of Addenda: 0

## 2023-02-10 NOTE — Anesthesia Preprocedure Evaluation (Signed)
Anesthesia Evaluation  Patient identified by MRN, date of birth, ID band Patient awake    Reviewed: Allergy & Precautions, H&P , NPO status , Patient's Chart, lab work & pertinent test results, reviewed documented beta blocker date and time   Airway Mallampati: II  TM Distance: >3 FB Neck ROM: full    Dental no notable dental hx.    Pulmonary neg pulmonary ROS, asthma , COPD, Current Smoker and Patient abstained from smoking.   Pulmonary exam normal breath sounds clear to auscultation       Cardiovascular Exercise Tolerance: Good + Past MI  negative cardio ROS  Rhythm:regular Rate:Normal     Neuro/Psych  Headaches PSYCHIATRIC DISORDERS Anxiety  Bipolar Disorder    Neuromuscular disease negative neurological ROS  negative psych ROS   GI/Hepatic negative GI ROS, Neg liver ROS,,,  Endo/Other  negative endocrine ROS    Renal/GU negative Renal ROS  negative genitourinary   Musculoskeletal   Abdominal   Peds  Hematology negative hematology ROS (+)   Anesthesia Other Findings   Reproductive/Obstetrics negative OB ROS                             Anesthesia Physical Anesthesia Plan  ASA: 3  Anesthesia Plan: General   Post-op Pain Management:    Induction:   PONV Risk Score and Plan: Propofol infusion  Airway Management Planned:   Additional Equipment:   Intra-op Plan:   Post-operative Plan:   Informed Consent: I have reviewed the patients History and Physical, chart, labs and discussed the procedure including the risks, benefits and alternatives for the proposed anesthesia with the patient or authorized representative who has indicated his/her understanding and acceptance.     Dental Advisory Given  Plan Discussed with: CRNA  Anesthesia Plan Comments:        Anesthesia Quick Evaluation

## 2023-02-11 ENCOUNTER — Encounter (INDEPENDENT_AMBULATORY_CARE_PROVIDER_SITE_OTHER): Payer: Self-pay | Admitting: *Deleted

## 2023-02-14 LAB — SURGICAL PATHOLOGY

## 2023-02-16 NOTE — Progress Notes (Signed)
I reviewed the pathology results. Ann, can you send her a letter with the findings as described below please?  Repeat colonoscopy in 5 years  Thanks,  Vista Lawman, MD Gastroenterology and Hepatology [facility]  ---------------------------------------------------------------------------------------------  [facility] [address], [address], [place], [place] [address] Phone:  [phone]   02/16/23 [place], [place]   Dear Collene Gobble,  I am writing to inform you that the biopsies taken during your recent endoscopic examination showed:  No H. Pylori bacteria in stomach , or any early cancer changes to the stomach mucosa ( Intestinal metaplasia)  Normal biopsies of the food-pipe ( no eosinophilic esophagitis )   I am writing to let you know the results of your recent colonoscopy.  You had a total of 6 polyps removed. The pathology came back as "hyperplastic polyp." These findings are NOT cancer and do not turn into cancer. Given these findings, it is recommended that your next colonoscopy be performed in 5 years given family history of colon cancer .   Please call us at [phone] if you have persistent problems or have questions about your condition that have not been fully answered at this time.  Sincerely,  Vista Lawman, MD Gastroenterology and Hepatology

## 2023-02-17 ENCOUNTER — Encounter (HOSPITAL_COMMUNITY): Payer: Self-pay | Admitting: Gastroenterology

## 2023-02-17 ENCOUNTER — Encounter (INDEPENDENT_AMBULATORY_CARE_PROVIDER_SITE_OTHER): Payer: Self-pay | Admitting: *Deleted

## 2023-03-03 ENCOUNTER — Other Ambulatory Visit: Payer: Self-pay | Admitting: Family Medicine

## 2023-03-03 DIAGNOSIS — G43009 Migraine without aura, not intractable, without status migrainosus: Secondary | ICD-10-CM

## 2023-03-23 ENCOUNTER — Telehealth: Payer: Self-pay | Admitting: Family Medicine

## 2023-03-23 NOTE — Telephone Encounter (Signed)
Patietn came in office . Wants a call back.  Had an endoscopy [ COLONOSCOPY WITH PROPOFOL ESOPHAGOGASTRODUODENOSCOPY (EGD) WITH PROPOFOL BIOPSY ESOPHAGEAL DILATION POLYPECTOMY  ] done back on 12/10 wants to know why her voice is still raspy.   Wants a call back

## 2023-03-23 NOTE — Telephone Encounter (Signed)
Kindly encourage the pt to follow up with GI

## 2023-04-07 ENCOUNTER — Other Ambulatory Visit: Payer: Self-pay | Admitting: Family Medicine

## 2023-04-07 DIAGNOSIS — G43009 Migraine without aura, not intractable, without status migrainosus: Secondary | ICD-10-CM

## 2023-05-10 ENCOUNTER — Other Ambulatory Visit (INDEPENDENT_AMBULATORY_CARE_PROVIDER_SITE_OTHER): Payer: Self-pay | Admitting: Gastroenterology

## 2023-06-03 ENCOUNTER — Ambulatory Visit (HOSPITAL_COMMUNITY)
Admission: RE | Admit: 2023-06-03 | Discharge: 2023-06-03 | Disposition: A | Discharge status: Home / Self Care | Payer: 59 | Source: Ambulatory Visit | Attending: Family Medicine | Admitting: Family Medicine

## 2023-06-03 ENCOUNTER — Encounter: Payer: Self-pay | Admitting: Family Medicine

## 2023-06-03 ENCOUNTER — Ambulatory Visit: Payer: 59 | Admitting: Family Medicine

## 2023-06-03 ENCOUNTER — Ambulatory Visit: Payer: Medicaid Other | Admitting: Family Medicine

## 2023-06-03 VITALS — BP 133/88 | Resp 16 | Ht 68.0 in | Wt 179.0 lb

## 2023-06-03 DIAGNOSIS — M542 Cervicalgia: Secondary | ICD-10-CM | POA: Diagnosis not present

## 2023-06-03 DIAGNOSIS — G8929 Other chronic pain: Secondary | ICD-10-CM

## 2023-06-03 DIAGNOSIS — H938X2 Other specified disorders of left ear: Secondary | ICD-10-CM

## 2023-06-03 DIAGNOSIS — R7301 Impaired fasting glucose: Secondary | ICD-10-CM | POA: Diagnosis not present

## 2023-06-03 DIAGNOSIS — F319 Bipolar disorder, unspecified: Secondary | ICD-10-CM

## 2023-06-03 DIAGNOSIS — M47812 Spondylosis without myelopathy or radiculopathy, cervical region: Secondary | ICD-10-CM | POA: Diagnosis not present

## 2023-06-03 DIAGNOSIS — E559 Vitamin D deficiency, unspecified: Secondary | ICD-10-CM

## 2023-06-03 DIAGNOSIS — E7849 Other hyperlipidemia: Secondary | ICD-10-CM

## 2023-06-03 DIAGNOSIS — E038 Other specified hypothyroidism: Secondary | ICD-10-CM

## 2023-06-03 DIAGNOSIS — M5031 Other cervical disc degeneration,  high cervical region: Secondary | ICD-10-CM | POA: Diagnosis not present

## 2023-06-03 MED ORDER — BACLOFEN 10 MG PO TABS
10.0000 mg | ORAL_TABLET | Freq: Three times a day (TID) | ORAL | 0 refills | Status: DC
Start: 2023-06-03 — End: 2023-07-14

## 2023-06-03 NOTE — Assessment & Plan Note (Signed)
The patient reports a growing mass on her ear, which first appeared several years ago as a small lesion but has since increased in size. She experiences occasional itching and tenderness at the affected site and notes that the mass sometimes gets caught in her hair, causing discomfort. She denies any recent injury, trauma, or bleeding.  A referral to dermatology will be placed for further evaluation and possible removal of the mass, as requested by the patient. She expresses understanding of the plan of care.

## 2023-06-03 NOTE — Assessment & Plan Note (Signed)
The patient reports a past diagnosis of bipolar I disorder from a few years ago and states that she was previously on a mood stabilizer, though she is unsure of the name of the medication. She denies any recent episodes of euphoria, decreased need for sleep, increased mood and energy, impulsivity, risky behaviors, or heightened irritability.  Currently, she reports feeling down, attributing this to recent life stressors, including the murder of her nephew and the passing of her mother-in-law. Additionally, she expresses distress over her marriage of 31 years falling apart, noting that she has always been a giver but feels that the same has not been reciprocated.  The patient becomes tearful while discussing these events and admits to having suicidal thoughts, but states that she would never act on them. She was encouraged to seek immediate care at the ER or behavioral health urgent care if suicidal thoughts worsen. She verbalized understanding and is aware of the plan of care.  A referral was placed to psychiatry for collaborative care and medication management of bipolar I disorder.

## 2023-06-03 NOTE — Assessment & Plan Note (Signed)
The patient reports a long history of neck pain, which she states has worsened over the past few months. She describes crepitus with neck movements but denies any recent injury or trauma. She does not experience numbness, tingling, fever, neck stiffness, nausea, vomiting, diarrhea, or decreased range of motion.  The patient notes that her neck pain is most noticeable upon waking and initially considered that it could be related to her sleeping position. However, despite changing sleep positions multiple times, the pain persists.  To further evaluate the cause of her symptoms, imaging studies will be obtained to rule out arthritis. A referral to physical therapy has been placed for strengthening and stretching exercises to improve flexibility and reduce pain. She was also advised to use warm compresses on the affected area for 20 minutes, four times daily, alternating with cold therapy as needed. The patient was encouraged to avoid aggravating activities and to follow up if symptoms worsen or fail to improve. She verbalized understanding of the plan of care.

## 2023-06-03 NOTE — Patient Instructions (Addendum)
I appreciate the opportunity to provide care to you today!    Follow up:  4 months  Labs: please stop by the lab  during the week to get your blood drawn (CBC, CMP, TSH, Lipid profile, HgA1c, Vit D)  Neck Pain Treatment Plan Please stop by [facility] to get an X-ray of your neck. Start taking Baclofen 10 mg; I recommend taking it at bedtime, as the medication can cause drowsiness. Apply heat to the affected site to help relieve muscle tension and reduce pain. Follow up if symptoms persist or worsen.   Attached with your AVS, you will find valuable resources for self-education. I highly recommend dedicating some time to thoroughly examine them.   Referral: dermatology , Physical therapy, Psychiatry  Please continue to a heart-healthy diet and increase your physical activities. Try to exercise for at least five days a week.    It was a pleasure to see you and I look forward to continuing to work together on your health and well-being. Please do not hesitate to call the office if you need care or have questions about your care.  In case of emergency, please visit the Emergency Department for urgent care, or contact our clinic at [phone] to schedule an appointment. We're here to help you!   Have a wonderful day and week. With Gratitude, Gilmore Laroche MSN, FNP-BC

## 2023-06-03 NOTE — Progress Notes (Signed)
Established Patient Office Visit  Subjective:  Patient ID: Nichole [name], female    DOB: July 24, 1970  Age: 53 y.o. MRN: [id number]  CC:  Chief Complaint  Patient presents with   Depression    Has been going through a lot here lately and is very stressed out    Mass    Has a growth on her left ear she wants checked. States it makes her neck hurt and causes headaches. States a nurse told her it looked cancerous and went to dermatology but they said it was age spots but it has grown in size since    HPI Nichole [name] is a 53 y.o. female with past medical history of Bipolar 1 presents for f/u of  chronic medical conditions.  Past Medical History:  Diagnosis Date   Arthritis    Asthma    Bipolar 1 disorder (HCC)    COPD (chronic obstructive pulmonary disease) (HCC)    COPD (chronic obstructive pulmonary disease) (HCC)    Heart attack (HCC) 2012   stress induced   Manic depression (HCC)    Panic attacks    Sciatica     Past Surgical History:  Procedure Laterality Date   BIOPSY  02/10/2023   Procedure: BIOPSY;  Surgeon: Franky Macho, MD;  [facility]: [facility];  Service: Endoscopy;;   CESAREAN SECTION     COLONOSCOPY WITH PROPOFOL N/A 02/10/2023   Procedure: COLONOSCOPY WITH PROPOFOL;  Surgeon: Franky Macho, MD;  Location: [facility];  Service: Endoscopy;  Laterality: N/A;  7:30am;asa 3   cyst removed from wrist     ESOPHAGEAL DILATION  02/10/2023   Procedure: ESOPHAGEAL DILATION;  Surgeon: Franky Macho, MD;  Location: [facility];  Service: Endoscopy;;   ESOPHAGOGASTRODUODENOSCOPY (EGD) WITH PROPOFOL N/A 02/10/2023   Procedure: ESOPHAGOGASTRODUODENOSCOPY (EGD) WITH PROPOFOL;  Surgeon: Franky Macho, MD;  Location: [facility];  Service: Endoscopy;  Laterality: N/A;  7:30am;asa 3   POLYPECTOMY  02/10/2023   Procedure: POLYPECTOMY;  Surgeon: Franky Macho, MD;  Location: [facility];  Service: Endoscopy;;   TONSILLECTOMY     TUBAL LIGATION       Family History  Problem Relation Age of Onset   Heart disease Mother    Mental illness Mother    Hypertension Mother    Lung disease Father    Heart disease Father    Lung cancer Father    Cancer Brother    Colon cancer Brother    Skin cancer Brother    Lung cancer Brother    Breast cancer Maternal Grandmother    Thyroid cancer Maternal Grandmother     Social History   Socioeconomic History   Marital status: Married    Spouse name: Not on file   Number of children: Not on file   Years of education: Not on file   Highest education level: Not on file  Occupational History   Not on file  Tobacco Use   Smoking status: Every Day    Current packs/day: 1.00    Types: Cigarettes    Passive exposure: Current   Smokeless tobacco: Never  Substance and Sexual Activity   Alcohol use: Yes    Comment: occ   Drug use: No   Sexual activity: Yes  Other Topics Concern   Not on file  Social History Narrative   Not on file   Social Drivers of Health   Financial Resource Strain: Not on file  Food Insecurity:  Not on file  Transportation Needs: Not on file  Physical Activity: Not on file  Stress: Not on file  Social Connections: Not on file  Intimate Partner Violence: Not on file    Outpatient Medications Prior to Visit  Medication Sig Dispense Refill   albuterol (VENTOLIN HFA) 108 (90 Base) MCG/ACT inhaler Inhale 2 puffs into the lungs every 6 (six) hours as needed for wheezing or shortness of breath. 18 g 0   Budeson-Glycopyrrol-Formoterol (BREZTRI AEROSPHERE) 160-9-4.8 MCG/ACT AERO Inhale 2 puffs into the lungs 2 (two) times daily. 10.7 g 11   diphenhydrAMINE (BENADRYL) 25 mg capsule Take 25 mg by mouth as needed for itching.     EPINEPHrine (EPIPEN 2-PAK) 0.3 mg/0.3 mL IJ SOAJ injection Inject 0.3 mg into the muscle as needed for anaphylaxis. 1 each 0   NURTEC 75 MG TBDP DISSOLVE 1 TABLET BY MOUTH AS NEEDED 16 tablet 0   pantoprazole (PROTONIX) 40 MG tablet Take 1 tablet  by mouth once daily 30 tablet 0   Riboflavin 400 MG CAPS Take 1 capsule (400 mg total) by mouth daily. 30 capsule 2   rosuvastatin (CRESTOR) 10 MG tablet Take 1 tablet (10 mg total) by mouth daily. 90 tablet 3   Vitamin D, Ergocalciferol, (DRISDOL) 1.25 MG (50000 UNIT) CAPS capsule Take 1 capsule (50,000 Units total) by mouth every 7 (seven) days. 20 capsule 1   No facility-administered medications prior to visit.    Allergies  Allergen Reactions   Bee Venom Anaphylaxis and Hives   Alka-Seltzer [Aspirin Effervescent]    Percocet [Oxycodone-Acetaminophen]     headache    ROS Review of Systems  Constitutional:  Negative for chills and fever.  Eyes:  Negative for visual disturbance.  Respiratory:  Negative for chest tightness and shortness of breath.   Skin:        Mass on left ear  Neurological:  Negative for dizziness and headaches.      Objective:    Physical Exam HENT:     Head: Normocephalic.     Mouth/Throat:     Mouth: Mucous membranes are moist.  Cardiovascular:     Rate and Rhythm: Normal rate.     Heart sounds: Normal heart sounds.  Pulmonary:     Effort: Pulmonary effort is normal.     Breath sounds: Normal breath sounds.  Neurological:     Mental Status: She is alert.  Psychiatric:        Mood and Affect: Affect is tearful.     BP 133/88   Resp 16   Ht 5\' 8"  (1.727 m)   Wt 179 lb (81.2 kg)   SpO2 95%   BMI 27.22 kg/m  Wt Readings from Last 3 Encounters:  06/03/23 179 lb (81.2 kg)  02/10/23 176 lb 2.4 oz (79.9 kg)  02/08/23 176 lb 0.6 oz (79.9 kg)    Lab Results  Component Value Date   TSH 1.690 12/31/2022   Lab Results  Component Value Date   WBC 8.9 12/31/2022   HGB 15.3 12/31/2022   HCT 44.5 12/31/2022   MCV 91 12/31/2022   PLT 385 12/31/2022   Lab Results  Component Value Date   NA 141 12/31/2022   K 4.6 12/31/2022   CO2 18 (L) 12/31/2022   GLUCOSE 100 (H) 12/31/2022   BUN 18 12/31/2022   CREATININE 0.65 12/31/2022   BILITOT  0.2 12/31/2022   ALKPHOS 94 12/31/2022   AST 13 12/31/2022   ALT 11 12/31/2022  PROT 7.1 12/31/2022   ALBUMIN 4.5 12/31/2022   CALCIUM 9.9 12/31/2022   ANIONGAP 13 01/07/2014   EGFR 107 12/31/2022   Lab Results  Component Value Date   CHOL 324 (H) 12/31/2022   Lab Results  Component Value Date   HDL 43 12/31/2022   Lab Results  Component Value Date   LDLCALC 229 (H) 12/31/2022   Lab Results  Component Value Date   TRIG 256 (H) 12/31/2022   Lab Results  Component Value Date   CHOLHDL 7.5 (H) 12/31/2022   Lab Results  Component Value Date   HGBA1C 6.0 (H) 12/31/2022      Assessment & Plan:  Bipolar 1 disorder (HCC) Assessment & Plan: The patient reports a past diagnosis of bipolar I disorder from a few years ago and states that she was previously on a mood stabilizer, though she is unsure of the name of the medication. She denies any recent episodes of euphoria, decreased need for sleep, increased mood and energy, impulsivity, risky behaviors, or heightened irritability.  Currently, she reports feeling down, attributing this to recent life stressors, including the murder of her nephew and the passing of her mother-in-law. Additionally, she expresses distress over her marriage of 31 years falling apart, noting that she has always been a giver but feels that the same has not been reciprocated.  The patient becomes tearful while discussing these events and admits to having suicidal thoughts, but states that she would never act on them. She was encouraged to seek immediate care at the ER or behavioral health urgent care if suicidal thoughts worsen. She verbalized understanding and is aware of the plan of care.  A referral was placed to psychiatry for collaborative care and medication management of bipolar I disorder.   Orders: -     Ambulatory referral to Psychiatry  Ear mass, left Assessment & Plan: The patient reports a growing mass on her ear, which first appeared  several years ago as a small lesion but has since increased in size. She experiences occasional itching and tenderness at the affected site and notes that the mass sometimes gets caught in her hair, causing discomfort. She denies any recent injury, trauma, or bleeding.  A referral to dermatology will be placed for further evaluation and possible removal of the mass, as requested by the patient. She expresses understanding of the plan of care.   Orders: -     Ambulatory referral to Dermatology  Neck pain, chronic Assessment & Plan: The patient reports a long history of neck pain, which she states has worsened over the past few months. She describes crepitus with neck movements but denies any recent injury or trauma. She does not experience numbness, tingling, fever, neck stiffness, nausea, vomiting, diarrhea, or decreased range of motion.  The patient notes that her neck pain is most noticeable upon waking and initially considered that it could be related to her sleeping position. However, despite changing sleep positions multiple times, the pain persists.  To further evaluate the cause of her symptoms, imaging studies will be obtained to rule out arthritis. A referral to physical therapy has been placed for strengthening and stretching exercises to improve flexibility and reduce pain. She was also advised to use warm compresses on the affected area for 20 minutes, four times daily, alternating with cold therapy as needed. The patient was encouraged to avoid aggravating activities and to follow up if symptoms worsen or fail to improve. She verbalized understanding of the plan of care.  Orders: -     Baclofen; Take 1 tablet (10 mg total) by mouth 3 (three) times daily.  Dispense: 30 each; Refill: 0 -     Ambulatory referral to Physical Therapy -     DG Cervical Spine Complete  IFG (impaired fasting glucose) -     Hemoglobin A1c  Vitamin D deficiency -     VITAMIN D 25 Hydroxy (Vit-D  Deficiency, Fractures)  TSH (thyroid-stimulating hormone deficiency) -     TSH + free T4  Other hyperlipidemia -     Lipid panel -     CMP14+EGFR -     CBC with Differential/Platelet  Note: This chart has been completed using Engineer, civil (consulting) software, and while attempts have been made to ensure accuracy, certain words and phrases may not be transcribed as intended.    Follow-up: Return in about 4 months (around 10/01/2023).   Gilmore Laroche, FNP

## 2023-06-07 ENCOUNTER — Other Ambulatory Visit (INDEPENDENT_AMBULATORY_CARE_PROVIDER_SITE_OTHER): Payer: Self-pay | Admitting: Gastroenterology

## 2023-06-07 LAB — HEMOGLOBIN A1C
Est. average glucose Bld gHb Est-mCnc: 126 mg/dL
Hgb A1c MFr Bld: 6 % — ABNORMAL HIGH (ref 4.8–5.6)

## 2023-06-07 LAB — TSH+FREE T4
Free T4: 0.86 ng/dL (ref 0.82–1.77)
TSH: 2.1 u[IU]/mL (ref 0.450–4.500)

## 2023-06-07 LAB — LIPID PANEL
Chol/HDL Ratio: 4 {ratio} (ref 0.0–4.4)
Cholesterol, Total: 199 mg/dL (ref 100–199)
HDL: 50 mg/dL (ref >39)
LDL Chol Calc (NIH): 111 mg/dL — ABNORMAL HIGH (ref 0–99)
Triglycerides: 220 mg/dL — ABNORMAL HIGH (ref 0–149)
VLDL Cholesterol Cal: 38 mg/dL (ref 5–40)

## 2023-06-07 LAB — CBC WITH DIFFERENTIAL/PLATELET
Basophils Absolute: 0.1 10*3/uL (ref 0.0–0.2)
Basos: 1 %
EOS (ABSOLUTE): 0.3 10*3/uL (ref 0.0–0.4)
Eos: 3 %
Hematocrit: 48.3 % — ABNORMAL HIGH (ref 34.0–46.6)
Hemoglobin: 16 g/dL — ABNORMAL HIGH (ref 11.1–15.9)
Immature Grans (Abs): 0 10*3/uL (ref 0.0–0.1)
Immature Granulocytes: 0 %
Lymphocytes Absolute: 2.2 10*3/uL (ref 0.7–3.1)
Lymphs: 24 %
MCH: 30.4 pg (ref 26.6–33.0)
MCHC: 33.1 g/dL (ref 31.5–35.7)
MCV: 92 fL (ref 79–97)
Monocytes Absolute: 0.5 10*3/uL (ref 0.1–0.9)
Monocytes: 5 %
Neutrophils Absolute: 6.1 10*3/uL (ref 1.4–7.0)
Neutrophils: 67 %
Platelets: 417 10*3/uL (ref 150–450)
RBC: 5.27 x10E6/uL (ref 3.77–5.28)
RDW: 13.6 % (ref 11.7–15.4)
WBC: 9.2 10*3/uL (ref 3.4–10.8)

## 2023-06-07 LAB — CMP14+EGFR
ALT: 13 [IU]/L (ref 0–32)
AST: 16 [IU]/L (ref 0–40)
Albumin: 4.6 g/dL (ref 3.8–4.9)
Alkaline Phosphatase: 102 [IU]/L (ref 44–121)
BUN/Creatinine Ratio: 18 (ref 9–23)
BUN: 14 mg/dL (ref 6–24)
Bilirubin Total: 0.2 mg/dL (ref 0.0–1.2)
CO2: 21 mmol/L (ref 20–29)
Calcium: 10.1 mg/dL (ref 8.7–10.2)
Chloride: 105 mmol/L (ref 96–106)
Creatinine, Ser: 0.78 mg/dL (ref 0.57–1.00)
Globulin, Total: 2.6 g/dL (ref 1.5–4.5)
Glucose: 101 mg/dL — ABNORMAL HIGH (ref 70–99)
Potassium: 4.4 mmol/L (ref 3.5–5.2)
Sodium: 143 mmol/L (ref 134–144)
Total Protein: 7.2 g/dL (ref 6.0–8.5)
eGFR: 92 mL/min/{1.73_m2} (ref >59)

## 2023-06-07 LAB — VITAMIN D 25 HYDROXY (VIT D DEFICIENCY, FRACTURES): Vit D, 25-Hydroxy: 43.3 ng/mL (ref 30.0–100.0)

## 2023-06-07 NOTE — Therapy (Signed)
 OUTPATIENT PHYSICAL THERAPY CERVICAL EVALUATION   Patient Name: Nichole [name] MRN: [id number] DOB:1970-12-30, 53 y.o., female Today's Date: 06/08/2023  END OF SESSION:  PT End of Session - 06/08/23 1431     Visit Number 1    Number of Visits 8    Date for PT Re-Evaluation 07/06/23    Authorization Type [employer]    PT Start Time 1432    PT Stop Time 1510    PT Time Calculation (min) 38 min    Activity Tolerance Patient limited by pain    Behavior During Therapy [facility] for tasks assessed/performed             Past Medical History:  Diagnosis Date   Arthritis    Asthma    Bipolar 1 disorder (HCC)    COPD (chronic obstructive pulmonary disease) (HCC)    COPD (chronic obstructive pulmonary disease) (HCC)    Heart attack (HCC) 2012   stress induced   Manic depression (HCC)    Panic attacks    Sciatica    Past Surgical History:  Procedure Laterality Date   BIOPSY  02/10/2023   Procedure: BIOPSY;  Surgeon: Cinderella Deatrice FALCON, MD;  [facility];  Service: Endoscopy;;   CESAREAN SECTION     COLONOSCOPY WITH PROPOFOL  N/A 02/10/2023   Procedure: COLONOSCOPY WITH PROPOFOL ;  Surgeon: Cinderella Deatrice FALCON, MD;  [facility];  Service: Endoscopy;  Laterality: N/A;  7:30am;asa 3   cyst removed from wrist     ESOPHAGEAL DILATION  02/10/2023   Procedure: ESOPHAGEAL DILATION;  Surgeon: Cinderella Deatrice FALCON, MD;  [facility];  Service: Endoscopy;;   ESOPHAGOGASTRODUODENOSCOPY (EGD) WITH PROPOFOL  N/A 02/10/2023   Procedure: ESOPHAGOGASTRODUODENOSCOPY (EGD) WITH PROPOFOL ;  Surgeon: Cinderella Deatrice FALCON, MD;  [facility];  Service: Endoscopy;  Laterality: N/A;  7:30am;asa 3   POLYPECTOMY  02/10/2023   Procedure: POLYPECTOMY;  Surgeon: Cinderella Deatrice FALCON, MD;  [facility];  Service: Endoscopy;;   TONSILLECTOMY     TUBAL LIGATION     Patient Active Problem List   Diagnosis Date Noted   Ear mass, left 06/03/2023   Neck pain, chronic  06/03/2023   Adenomatous polyp of sigmoid colon 02/10/2023   Schatzki ring of distal esophagus 02/10/2023   Gastritis and gastroduodenitis 02/10/2023   Cervical cancer screening 01/31/2023   Positive colorectal cancer screening using Cologuard test 01/18/2023   Smoking addiction 01/05/2023   Family history of colon cancer 01/05/2023   COPD with asthma (HCC) 12/29/2022   Bipolar 1 disorder (HCC) 12/29/2022   PTSD (post-traumatic stress disorder) 12/29/2022   Migraines 12/29/2022   Panic attack 12/29/2022   Generalized anxiety disorder 12/29/2022   Manic depression (HCC) 12/29/2022   Dysphagia 12/29/2022   Bee sting allergy 12/29/2022   Tobacco use 12/29/2022   Chest pain 04/03/2013   Acute chest pain 04/03/2013   Sciatica 07/02/2009   Backache 07/02/2009    PCP: Zarwolo, Gloria, FNP  REFERRING PROVIDER: Zarwolo, Gloria, FNP  REFERRING DIAG: [phone] (ICD-10-CM) - Neck pain, chronic  THERAPY DIAG:  Neck pain - Plan: PT plan of care cert/re-cert  Neck stiffness - Plan: PT plan of care cert/re-cert  Pain in thoracic spine - Plan: PT plan of care cert/re-cert  Rationale for Evaluation and Treatment: Rehabilitation  ONSET DATE: chronic but worse last 1.5 months  SUBJECTIVE:  SUBJECTIVE STATEMENT: Neck pain for a while but has gotten worse since Christmas; painful and stiff; she reports lots of stress in her life.  Also referrd to psychiatry and dermatologist; has large growth on left ear.   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 Yes: NPRS scale: 8-9/10 Pain location: neck and upper back between shoulder blades Pain description: sharp,  tight, throbbing Aggravating factors: worse in morning Relieving factors: muscle relaxer  PRECAUTIONS: None      WEIGHT BEARING RESTRICTIONS: No  FALLS:  Has patient fallen in last 6 months? No   OCCUPATION: [employer]; driving  PLOF: Independent  PATIENT GOALS: no neck pain  NEXT MD VISIT: 3 or 4 months  OBJECTIVE:  Note: Objective measures were completed at Evaluation unless otherwise noted.  DIAGNOSTIC FINDINGS:  Xray arthritis  PATIENT SURVEYS:  NDI 28/50 56%   COGNITION: Overall cognitive status: Within functional limits for tasks assessed  SENSATION: Fingertips tingle; R > L intermittently  POSTURE: rounded shoulders, forward head, and guarding of the neck  PALPATION: Very tender on palpation to bilateral upper traps, rhomboids, periscapular area   CERVICAL ROM:   Active ROM AROM (deg) eval  Flexion 30  Extension 2*  Right lateral flexion 15*  Left lateral flexion 14  Right rotation 8*  Left rotation 18   (Blank rows = not tested)  UPPER EXTREMITY MNF:ejpw in neck and upper back with shoulder flexion  Active ROM Right eval Left eval  Shoulder flexion Wfl* Wfl*  Shoulder extension    Shoulder abduction    Shoulder adduction    Shoulder extension    Shoulder internal rotation    Shoulder external rotation    Elbow flexion    Elbow extension    Wrist flexion    Wrist extension    Wrist ulnar deviation    Wrist radial deviation    Wrist pronation    Wrist supination     (Blank rows = not tested)  UPPER EXTREMITY MMT:  MMT Right eval Left eval  Shoulder flexion 4-* 4-*  Shoulder extension    Shoulder abduction    Shoulder adduction    Shoulder extension    Shoulder internal rotation    Shoulder external rotation    Middle trapezius    Lower trapezius    Elbow flexion    Elbow extension    Wrist flexion    Wrist extension    Wrist ulnar deviation    Wrist radial deviation    Wrist pronation    Wrist supination    Grip  strength     (Blank rows = not tested)    TREATMENT DATE: 06/08/23 physical therapy evaluation and HEP instruction  PATIENT EDUCATION:  Education details: Patient educated on exam findings, POC, scope of PT, HEP, and importance of good posture. Person educated: Patient Education method: Explanation, Demonstration, and Handouts Education comprehension: verbalized understanding, returned demonstration, verbal cues required, and tactile cues required  HOME EXERCISE PROGRAM: Access Code: [email or web address] URL: [email or web address] Date: 06/08/2023 Prepared by: [facility]  Exercises - Seated Cervical Retraction  - 1 x daily - 7 x weekly - 3 sets - 10 reps - Supine Chin Tuck  - 1 x daily - 7 x weekly - 3 sets - 10 reps - Shoulder Rolls in Sitting  - 1 x daily - 7 x weekly - 3 sets - 10 reps  ASSESSMENT:  CLINICAL IMPRESSION: Patient is a 53 y.o. female who was seen today for physical therapy evaluation and treatment for M54.2,G89.29 (ICD-10-CM) - Neck pain, chronic. Patient demonstrates decreased strength, ROM restriction, reduced flexibility, increased tenderness to palpation and postural abnormalities which are likely contributing to symptoms of pain and are negatively impacting patient ability to perform ADLs. Patient will benefit from skilled physical therapy services to address these deficits to reduce pain and improve level of function with ADLs   OBJECTIVE IMPAIRMENTS: decreased activity tolerance, decreased mobility, decreased ROM, increased fascial restrictions, impaired perceived functional ability, increased muscle spasms, postural dysfunction, and pain.   ACTIVITY LIMITATIONS: carrying, lifting, bending, sitting, standing, sleeping, and caring for others  PARTICIPATION LIMITATIONS: meal prep, cleaning, laundry, driving, community activity, and  occupation  REHAB POTENTIAL: Good  CLINICAL DECISION MAKING: Evolving/moderate complexity  EVALUATION COMPLEXITY: Moderate   GOALS: Goals reviewed with patient? No  SHORT TERM GOALS: Target date: 06/22/2023  patient will be independent with initial HEP  Baseline:  Goal status: INITIAL  2.  Patient will report 30% improvement overall  Baseline:  Goal status: INITIAL   LONG TERM GOALS: Target date: 07/06/2023  Patient will be independent in self management strategies to improve quality of life and functional outcomes.  Baseline:  Goal status: INITIAL  2.  Patient will report 50% improvement overall  Baseline:  Goal status: INITIAL  3.  Patient will increase cervical mobility by 40 degrees throughout to improve ability to scan for safety Baseline: see above Goal status: INITIAL  4.  Patient will improve NDI score to 15/50 or less to demonstrate improve perceived function Baseline: 28/50 Goal status: INITIAL  5.  Patient will be able to raise her arms overhead without neck pain to reach into cabinets without issue Baseline:  Goal status: INITIAL  PLAN:  PT FREQUENCY: 1-2x/week  PT DURATION: 4 weeks  PLANNED INTERVENTIONS: 97164- PT Re-evaluation, 97110-Therapeutic exercises, 97530- Therapeutic activity, 97112- Neuromuscular re-education, 97535- Self Care, 02859- Manual therapy, [phone]- Gait training, [phone]- Orthotic Fit/training, [phone]- Canalith repositioning, [id number]- Aquatic Therapy, [phone]- Splinting, Patient/Family education, Balance training, Stair training, Taping, Dry Needling, Joint mobilization, Joint manipulation, Spinal manipulation, Spinal mobilization, Scar mobilization, and DME instructions.   PLAN FOR NEXT SESSION: Review HEP and goals; cervical mobility and postural strength; patient very guarded and painful; try some moist heat; manual if she can tolerate; she [place].    3:14  PM, 06/08/23 Shahiem Bedwell Small Abdur Hoglund MPT [facility] [place] [phone]

## 2023-06-08 ENCOUNTER — Other Ambulatory Visit: Payer: Self-pay

## 2023-06-08 ENCOUNTER — Ambulatory Visit (HOSPITAL_COMMUNITY): Payer: 59 | Attending: Family Medicine

## 2023-06-08 DIAGNOSIS — M542 Cervicalgia: Secondary | ICD-10-CM | POA: Diagnosis present

## 2023-06-08 DIAGNOSIS — M546 Pain in thoracic spine: Secondary | ICD-10-CM | POA: Insufficient documentation

## 2023-06-08 DIAGNOSIS — M436 Torticollis: Secondary | ICD-10-CM | POA: Diagnosis present

## 2023-06-08 DIAGNOSIS — G8929 Other chronic pain: Secondary | ICD-10-CM | POA: Diagnosis not present

## 2023-06-12 ENCOUNTER — Encounter: Payer: Self-pay | Admitting: Family Medicine

## 2023-06-15 ENCOUNTER — Encounter (HOSPITAL_COMMUNITY): Payer: 59

## 2023-06-16 ENCOUNTER — Ambulatory Visit (HOSPITAL_COMMUNITY): Payer: 59

## 2023-06-16 ENCOUNTER — Encounter (HOSPITAL_COMMUNITY): Payer: Self-pay

## 2023-06-16 DIAGNOSIS — M436 Torticollis: Secondary | ICD-10-CM

## 2023-06-16 DIAGNOSIS — M542 Cervicalgia: Secondary | ICD-10-CM

## 2023-06-16 DIAGNOSIS — M546 Pain in thoracic spine: Secondary | ICD-10-CM

## 2023-06-16 NOTE — Therapy (Signed)
OUTPATIENT PHYSICAL THERAPY CERVICAL TREATMENT   Patient Name: Nichole [name] MRN: [id number] DOB:11-08-1970, 53 y.o., female Today's Date: 06/16/2023  END OF SESSION:  PT End of Session - 06/16/23 1036     Visit Number 2    Number of Visits 8    Date for PT Re-Evaluation 07/06/23    Authorization Type [employer]    PT Start Time [phone]    PT Stop Time 0758    PT Time Calculation (min) 41 min    Activity Tolerance Patient limited by pain    Behavior During Therapy [facility] for tasks assessed/performed              Past Medical History:  Diagnosis Date   Arthritis    Asthma    Bipolar 1 disorder (HCC)    COPD (chronic obstructive pulmonary disease) (HCC)    COPD (chronic obstructive pulmonary disease) (HCC)    Heart attack (HCC) 2012   stress induced   Manic depression (HCC)    Panic attacks    Sciatica    Past Surgical History:  Procedure Laterality Date   BIOPSY  02/10/2023   Procedure: BIOPSY;  Surgeon: Franky Macho, MD;  [facility];  Service: Endoscopy;;   CESAREAN SECTION     COLONOSCOPY WITH PROPOFOL N/A 02/10/2023   Procedure: COLONOSCOPY WITH PROPOFOL;  Surgeon: Franky Macho, MD;  [facility];  Service: Endoscopy;  Laterality: N/A;  7:30am;asa 3   cyst removed from wrist     ESOPHAGEAL DILATION  02/10/2023   Procedure: ESOPHAGEAL DILATION;  Surgeon: Franky Macho, MD;  [facility];  Service: Endoscopy;;   ESOPHAGOGASTRODUODENOSCOPY (EGD) WITH PROPOFOL N/A 02/10/2023   Procedure: ESOPHAGOGASTRODUODENOSCOPY (EGD) WITH PROPOFOL;  Surgeon: Franky Macho, MD;  [facility];  Service: Endoscopy;  Laterality: N/A;  7:30am;asa 3   POLYPECTOMY  02/10/2023   Procedure: POLYPECTOMY;  Surgeon: Franky Macho, MD;  [facility];  Service: Endoscopy;;   TONSILLECTOMY     TUBAL LIGATION     Patient Active Problem List   Diagnosis Date Noted   Ear mass, left 06/03/2023   Neck pain,  chronic 06/03/2023   Adenomatous polyp of sigmoid colon 02/10/2023   Schatzki ring of distal esophagus 02/10/2023   Gastritis and gastroduodenitis 02/10/2023   Cervical cancer screening 01/31/2023   Positive colorectal cancer screening using Cologuard test 01/18/2023   Smoking addiction 01/05/2023   Family history of colon cancer 01/05/2023   COPD with asthma (HCC) 12/29/2022   Bipolar 1 disorder (HCC) 12/29/2022   PTSD (post-traumatic stress disorder) 12/29/2022   Migraines 12/29/2022   Panic attack 12/29/2022   Generalized anxiety disorder 12/29/2022   Manic depression (HCC) 12/29/2022   Dysphagia 12/29/2022   Bee sting allergy 12/29/2022   Tobacco use 12/29/2022   Chest pain 04/03/2013   Acute chest pain 04/03/2013   Sciatica 07/02/2009   Backache 07/02/2009    PCP: Gilmore Laroche, FNP  REFERRING PROVIDER: Gilmore Laroche, FNP  REFERRING DIAG: [phone] (ICD-10-CM) - Neck pain, chronic  THERAPY DIAG:  Neck pain  Neck stiffness  Pain in thoracic spine  Rationale for Evaluation and Treatment: Rehabilitation  ONSET DATE: chronic but worse last 1.5 months  SUBJECTIVE:  SUBJECTIVE STATEMENT: 06/16/23:  Reports she woke up really stiff this morning and painful, pain medication taken 2 hours with pain scale 6/10.    Eval:  Neck pain for a while but has gotten worse since Christmas; painful and stiff; she reports lots of stress in her life.  Also referrd to psychiatry and dermatologist; has large growth on left ear.   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 Yes: NPRS scale: 8-9/10 Pain location: neck and upper back between shoulder  blades Pain description: sharp, tight, throbbing Aggravating factors: worse in morning Relieving factors: muscle relaxer  PRECAUTIONS: None      WEIGHT BEARING RESTRICTIONS: No  FALLS:  Has patient fallen in last 6 months? No   OCCUPATION: [employer]; driving  PLOF: Independent  PATIENT GOALS: no neck pain  NEXT MD VISIT: 3 or 4 months  OBJECTIVE:  Note: Objective measures were completed at Evaluation unless otherwise noted.  DIAGNOSTIC FINDINGS:  Xray "arthritis"  PATIENT SURVEYS:  NDI 28/50 56%   COGNITION: Overall cognitive status: Within functional limits for tasks assessed  SENSATION: Fingertips tingle; R > L intermittently  POSTURE: rounded shoulders, forward head, and guarding of the neck  PALPATION: Very tender on palpation to bilateral upper traps, rhomboids, periscapular area   CERVICAL ROM:   Active ROM AROM (deg) eval  Flexion 30  Extension 2*  Right lateral flexion 15*  Left lateral flexion 14  Right rotation 8*  Left rotation 18   (Blank rows = not tested)  UPPER EXTREMITY [id number] in neck and upper back with shoulder flexion  Active ROM Right eval Left eval  Shoulder flexion Wfl* Wfl*  Shoulder extension    Shoulder abduction    Shoulder adduction    Shoulder extension    Shoulder internal rotation    Shoulder external rotation    Elbow flexion    Elbow extension    Wrist flexion    Wrist extension    Wrist ulnar deviation    Wrist radial deviation    Wrist pronation    Wrist supination     (Blank rows = not tested)  UPPER EXTREMITY MMT:  MMT Right eval Left eval  Shoulder flexion 4-* 4-*  Shoulder extension    Shoulder abduction    Shoulder adduction    Shoulder extension    Shoulder internal rotation    Shoulder external rotation    Middle trapezius    Lower trapezius    Elbow flexion    Elbow extension    Wrist flexion    Wrist extension    Wrist ulnar deviation    Wrist radial deviation    Wrist  pronation    Wrist supination    Grip strength     (Blank rows = not tested)    TREATMENT DATE:  06/16/23: MHP on neck while reviewing goals Supine with LE elevated on wedge: STM, suboccipitial release, cervical traction, trigger point release UT  Chin tuck Seated: Chin tuck Posterior shoulder rolls  06/08/23 physical therapy evaluation and HEP instruction  PATIENT EDUCATION:  Education details: Patient educated on exam findings, POC, scope of PT, HEP, and importance of good posture. Person educated: Patient Education method: Explanation, Demonstration, and Handouts Education comprehension: verbalized understanding, returned demonstration, verbal cues required, and tactile cues required  HOME EXERCISE PROGRAM: Access Code: [email or web address] URL: [email or web address] Date: 06/08/2023 Prepared by: [facility]  Exercises - Seated Cervical Retraction  - 1 x daily - 7 x weekly - 3 sets - 10 reps - Supine Chin Tuck  - 1 x daily - 7 x weekly - 3 sets - 10 reps - Shoulder Rolls in Sitting  - 1 x daily - 7 x weekly - 3 sets - 10 reps  ASSESSMENT:  CLINICAL IMPRESSION: 06/16/23:  Reviewed goals and educated importance of HEP compliance for maximal benefits.  Pt able to recall current exercise program.  Pt limited by pain, guarded with movements, and spoke of several stress related issues in life.  Session focus on pain control and cervical mobility.  Pt educated on techniques to calm CNS including deep breathing for pain control.  Manual technqiues complete this session to address moderate to severe spasms on UT and cervical mm.  Able to reduce though unable to full resolve spasms this session.  Encouraged pt to stay hydrated following therapy to reduce risk of headache.  Eval:  Patient is a 53 y.o. female who was seen today for physical therapy evaluation  and treatment for M54.2,G89.29 (ICD-10-CM) - Neck pain, chronic. Patient demonstrates decreased strength, ROM restriction, reduced flexibility, increased tenderness to palpation and postural abnormalities which are likely contributing to symptoms of pain and are negatively impacting patient ability to perform ADLs. Patient will benefit from skilled physical therapy services to address these deficits to reduce pain and improve level of function with ADLs   OBJECTIVE IMPAIRMENTS: decreased activity tolerance, decreased mobility, decreased ROM, increased fascial restrictions, impaired perceived functional ability, increased muscle spasms, postural dysfunction, and pain.   ACTIVITY LIMITATIONS: carrying, lifting, bending, sitting, standing, sleeping, and caring for others  PARTICIPATION LIMITATIONS: meal prep, cleaning, laundry, driving, community activity, and occupation  REHAB POTENTIAL: Good  CLINICAL DECISION MAKING: Evolving/moderate complexity  EVALUATION COMPLEXITY: Moderate   GOALS: Goals reviewed with patient? No  SHORT TERM GOALS: Target date: 06/22/2023  patient will be independent with initial HEP  Baseline:  Goal status: INITIAL  2.  Patient will report 30% improvement overall  Baseline:  Goal status: INITIAL   LONG TERM GOALS: Target date: 07/06/2023  Patient will be independent in self management strategies to improve quality of life and functional outcomes.  Baseline:  Goal status: INITIAL  2.  Patient will report 50% improvement overall  Baseline:  Goal status: INITIAL  3.  Patient will increase cervical mobility by 40 degrees throughout to improve ability to scan for safety Baseline: see above Goal status: INITIAL  4.  Patient will improve NDI score to 15/50 or less to demonstrate improve perceived function Baseline: 28/50 Goal status: INITIAL  5.  Patient will be able to raise her arms overhead without neck pain to reach into cabinets without  issue Baseline:  Goal status: INITIAL  PLAN:  PT FREQUENCY: 1-2x/week  PT DURATION: 4 weeks  PLANNED INTERVENTIONS: 97164- PT Re-evaluation, 97110-Therapeutic exercises, 97530- Therapeutic activity, 97112- Neuromuscular re-education, 97535- Self Care, 56213- Manual therapy, [phone]- Gait training, [phone]- Orthotic Fit/training, [phone]- Canalith repositioning, [id number]- Aquatic Therapy, [phone]- Splinting, Patient/Family education, Balance training, Stair training, Taping, Dry Needling, Joint mobilization, Joint manipulation, Spinal manipulation, Spinal mobilization, Scar mobilization,  and DME instructions.   PLAN FOR NEXT SESSION: Review HEP and goals; cervical mobility and postural strength; patient very guarded and painful; try some moist heat; manual if she can tolerate; she [place].   Becky Sax, LPTA/CLT; CBIS [phone]  10:48 AM, 06/16/23

## 2023-06-21 ENCOUNTER — Encounter (HOSPITAL_COMMUNITY): Payer: Self-pay

## 2023-06-21 ENCOUNTER — Ambulatory Visit (HOSPITAL_COMMUNITY): Payer: 59

## 2023-06-21 DIAGNOSIS — M542 Cervicalgia: Secondary | ICD-10-CM | POA: Diagnosis not present

## 2023-06-21 DIAGNOSIS — M436 Torticollis: Secondary | ICD-10-CM

## 2023-06-21 DIAGNOSIS — M546 Pain in thoracic spine: Secondary | ICD-10-CM
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
## 2023-06-23 ENCOUNTER — Ambulatory Visit (HOSPITAL_COMMUNITY): Payer: 59 | Admitting: Physical Therapy

## 2023-06-23 ENCOUNTER — Encounter (HOSPITAL_COMMUNITY): Payer: 59 | Admitting: Physical Therapy

## 2023-06-23 DIAGNOSIS — M542 Cervicalgia: Secondary | ICD-10-CM | POA: Diagnosis not present

## 2023-06-23 DIAGNOSIS — M436 Torticollis: Secondary | ICD-10-CM

## 2023-06-23 DIAGNOSIS — M546 Pain in thoracic spine: Secondary | ICD-10-CM
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
## 2023-06-24 ENCOUNTER — Other Ambulatory Visit: Payer: Self-pay | Admitting: Family Medicine

## 2023-06-24 DIAGNOSIS — G43009 Migraine without aura, not intractable, without status migrainosus: Secondary | ICD-10-CM

## 2023-06-29 ENCOUNTER — Ambulatory Visit (HOSPITAL_COMMUNITY): Payer: 59

## 2023-06-29 ENCOUNTER — Other Ambulatory Visit: Payer: Self-pay | Admitting: Family Medicine

## 2023-06-29 DIAGNOSIS — J4489 Other specified chronic obstructive pulmonary disease: Secondary | ICD-10-CM

## 2023-06-29 DIAGNOSIS — M542 Cervicalgia: Secondary | ICD-10-CM | POA: Diagnosis not present

## 2023-06-29 DIAGNOSIS — M546 Pain in thoracic spine: Secondary | ICD-10-CM

## 2023-06-29 DIAGNOSIS — M436 Torticollis: Secondary | ICD-10-CM
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
## 2023-07-01 ENCOUNTER — Encounter (HOSPITAL_COMMUNITY): Payer: 59

## 2023-07-04 ENCOUNTER — Ambulatory Visit (HOSPITAL_COMMUNITY): Payer: 59 | Attending: Family Medicine

## 2023-07-04 DIAGNOSIS — M546 Pain in thoracic spine: Secondary | ICD-10-CM | POA: Diagnosis present

## 2023-07-04 DIAGNOSIS — M436 Torticollis: Secondary | ICD-10-CM | POA: Diagnosis present

## 2023-07-04 DIAGNOSIS — M542 Cervicalgia: Secondary | ICD-10-CM
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
## 2023-07-06 ENCOUNTER — Encounter (HOSPITAL_COMMUNITY): Payer: 59

## 2023-07-12 ENCOUNTER — Other Ambulatory Visit: Payer: Self-pay | Admitting: Family Medicine

## 2023-07-12 ENCOUNTER — Other Ambulatory Visit (INDEPENDENT_AMBULATORY_CARE_PROVIDER_SITE_OTHER): Payer: Self-pay | Admitting: Gastroenterology

## 2023-07-12 DIAGNOSIS — G8929 Other chronic pain: Secondary | ICD-10-CM

## 2023-07-13 ENCOUNTER — Other Ambulatory Visit: Payer: Self-pay | Admitting: Family Medicine

## 2023-07-13 DIAGNOSIS — G43009 Migraine without aura, not intractable, without status migrainosus: Secondary | ICD-10-CM

## 2023-07-20 ENCOUNTER — Encounter (HOSPITAL_COMMUNITY): Payer: 59 | Admitting: Physical Therapy

## 2023-08-10 ENCOUNTER — Other Ambulatory Visit (INDEPENDENT_AMBULATORY_CARE_PROVIDER_SITE_OTHER): Payer: Self-pay | Admitting: Gastroenterology

## 2023-08-15 ENCOUNTER — Other Ambulatory Visit: Payer: Self-pay | Admitting: Family Medicine

## 2023-08-15 DIAGNOSIS — G8929 Other chronic pain: Secondary | ICD-10-CM

## 2023-09-05 ENCOUNTER — Other Ambulatory Visit (INDEPENDENT_AMBULATORY_CARE_PROVIDER_SITE_OTHER): Payer: Self-pay | Admitting: Gastroenterology

## 2023-09-16 ENCOUNTER — Other Ambulatory Visit: Payer: Self-pay | Admitting: Family Medicine

## 2023-09-16 DIAGNOSIS — G43009 Migraine without aura, not intractable, without status migrainosus: Secondary | ICD-10-CM

## 2023-09-16 DIAGNOSIS — G8929 Other chronic pain: Secondary | ICD-10-CM

## 2023-09-18 ENCOUNTER — Other Ambulatory Visit: Payer: Self-pay | Admitting: Family Medicine

## 2023-09-18 DIAGNOSIS — G8929 Other chronic pain: Secondary | ICD-10-CM

## 2023-10-03 ENCOUNTER — Ambulatory Visit (HOSPITAL_COMMUNITY)
Admission: RE | Admit: 2023-10-03 | Discharge: 2023-10-03 | Disposition: A | Discharge status: Home / Self Care | Source: Ambulatory Visit | Attending: Family Medicine | Admitting: Family Medicine

## 2023-10-03 ENCOUNTER — Encounter: Payer: Self-pay | Admitting: Family Medicine

## 2023-10-03 ENCOUNTER — Ambulatory Visit: Payer: 59 | Admitting: Family Medicine

## 2023-10-03 VITALS — BP 136/98 | HR 94 | Resp 18 | Ht 68.0 in | Wt 186.0 lb

## 2023-10-03 DIAGNOSIS — R042 Hemoptysis: Secondary | ICD-10-CM

## 2023-10-03 DIAGNOSIS — E7849 Other hyperlipidemia: Secondary | ICD-10-CM

## 2023-10-03 DIAGNOSIS — R7301 Impaired fasting glucose: Secondary | ICD-10-CM | POA: Diagnosis not present

## 2023-10-03 DIAGNOSIS — E559 Vitamin D deficiency, unspecified: Secondary | ICD-10-CM | POA: Diagnosis not present

## 2023-10-03 DIAGNOSIS — E038 Other specified hypothyroidism: Secondary | ICD-10-CM

## 2023-10-03 DIAGNOSIS — Z720 Tobacco use: Secondary | ICD-10-CM

## 2023-10-03 NOTE — Progress Notes (Unsigned)
 Established Patient Office Visit  Subjective:  Patient ID: Nichole [name], female    DOB: 1970-09-21  Age: 53 y.o. MRN: [id number]  CC:  Chief Complaint  Patient presents with   Cough    Pt states for last couple weeks, pt has been coughing up phlem with blood    Follow-up    4 month follow up     HPI Nichole [name] is a 53 y.o. female with past medical history of GERD, hyperlipidemia and bipolar 1 disorder presents for f/u of  chronic medical conditions.  For the details of today's visit, please refer to the assessment and plan.    Hemoptysis:The patient reports intermittently coughing up red-tinged phlegm. She is a chronic smoker, currently smoking two packs of cigarettes daily. She reports a family history of lung cancer--her father died of the disease. The patient denies unintentional weight loss, shortness of breath, hoarseness, fatigue, or weakness.   Past Medical History:  Diagnosis Date   Arthritis    Asthma    Bipolar 1 disorder (HCC)    COPD (chronic obstructive pulmonary disease) (HCC)    COPD (chronic obstructive pulmonary disease) (HCC)    Heart attack (HCC) 2012   stress induced   Manic depression (HCC)    Panic attacks    Sciatica     Past Surgical History:  Procedure Laterality Date   BIOPSY  02/10/2023   Procedure: BIOPSY;  Surgeon: Hargis Lias, MD;  [facility]: [facility];  Service: Endoscopy;;   CESAREAN SECTION     COLONOSCOPY WITH PROPOFOL  N/A 02/10/2023   Procedure: COLONOSCOPY WITH PROPOFOL ;  Surgeon: Hargis Lias, MD;  Location: [facility];  Service: Endoscopy;  Laterality: N/A;  7:30am;asa 3   cyst removed from wrist     ESOPHAGEAL DILATION  02/10/2023   Procedure: ESOPHAGEAL DILATION;  Surgeon: Hargis Lias, MD;  Location: [facility];  Service: Endoscopy;;   ESOPHAGOGASTRODUODENOSCOPY (EGD) WITH PROPOFOL  N/A 02/10/2023   Procedure: ESOPHAGOGASTRODUODENOSCOPY (EGD) WITH PROPOFOL ;  Surgeon: Hargis Lias, MD;  Location:  [facility];  Service: Endoscopy;  Laterality: N/A;  7:30am;asa 3   POLYPECTOMY  02/10/2023   Procedure: POLYPECTOMY;  Surgeon: Hargis Lias, MD;  Location: [facility];  Service: Endoscopy;;   TONSILLECTOMY     TUBAL LIGATION      Family History  Problem Relation Age of Onset   Heart disease Mother    Mental illness Mother    Hypertension Mother    Lung disease Father    Heart disease Father    Lung cancer Father    Cancer Brother    Colon cancer Brother    Skin cancer Brother    Lung cancer Brother    Breast cancer Maternal Grandmother    Thyroid cancer Maternal Grandmother     Social History   Socioeconomic History   Marital status: Married    Spouse name: Not on file   Number of children: Not on file   Years of education: Not on file   Highest education level: Not on file  Occupational History   Not on file  Tobacco Use   Smoking status: Every Day    Current packs/day: 1.00    Types: Cigarettes    Passive exposure: Current   Smokeless tobacco: Never  Substance and Sexual Activity   Alcohol use: Yes    Comment: occ   Drug use: No   Sexual activity: Yes  Other Topics Concern  Not on file  Social History Narrative   Not on file   Social Drivers of Health   Financial Resource Strain: Not on file  Food Insecurity: Not on file  Transportation Needs: Not on file  Physical Activity: Not on file  Stress: Not on file  Social Connections: Not on file  Intimate Partner Violence: Not on file    Outpatient Medications Prior to Visit  Medication Sig Dispense Refill   baclofen  (LIORESAL ) 10 MG tablet TAKE 1 TABLET BY MOUTH THREE TIMES DAILY 30 tablet 0   Budeson-Glycopyrrol-Formoterol (BREZTRI  AEROSPHERE) 160-9-4.8 MCG/ACT AERO Inhale 2 puffs into the lungs 2 (two) times daily. 10.7 g 11   diphenhydrAMINE  (BENADRYL ) 25 mg capsule Take 25 mg by mouth as needed for itching.     EPINEPHrine  (EPIPEN  2-PAK) 0.3 mg/0.3 mL IJ SOAJ injection Inject 0.3 mg into  the muscle as needed for anaphylaxis. 1 each 0   NURTEC 75 MG TBDP DISSOLVE 1 TABLET BY MOUTH AS NEEDED 16 tablet 0   pantoprazole  (PROTONIX ) 40 MG tablet Take 1 tablet by mouth once daily 30 tablet 0   Riboflavin  400 MG CAPS Take 1 capsule (400 mg total) by mouth daily. 30 capsule 2   rosuvastatin  (CRESTOR ) 10 MG tablet Take 1 tablet (10 mg total) by mouth daily. 90 tablet 3   VENTOLIN  HFA 108 (90 Base) MCG/ACT inhaler INHALE 2 PUFFS BY MOUTH EVERY 6 HOURS AS NEEDED FOR WHEEZING FOR SHORTNESS OF BREATH 18 g 0   Vitamin D , Ergocalciferol , (DRISDOL ) 1.25 MG (50000 UNIT) CAPS capsule Take 1 capsule (50,000 Units total) by mouth every 7 (seven) days. 20 capsule 1   No facility-administered medications prior to visit.    Allergies  Allergen Reactions   Bee Venom Anaphylaxis and Hives   Alka-Seltzer [Aspirin  Effervescent]    Percocet [Oxycodone-Acetaminophen ]     headache    ROS Review of Systems  Constitutional:  Negative for chills and fever.  Eyes:  Negative for visual disturbance.  Respiratory:  Positive for cough. Negative for chest tightness and shortness of breath.   Neurological:  Negative for dizziness and headaches.      Objective:     Physical Exam HENT:     Head: Normocephalic.     Mouth/Throat:     Mouth: Mucous membranes are moist.  Cardiovascular:     Rate and Rhythm: Normal rate.     Heart sounds: Normal heart sounds.  Pulmonary:     Effort: Pulmonary effort is normal.     Breath sounds: Normal breath sounds.  Neurological:     Mental Status: She is alert.     BP (!) 136/98   Pulse 94   Resp 18   Ht 5\' 8"  (1.727 m)   Wt 186 lb 0.6 oz (84.4 kg)   SpO2 97%   BMI 28.29 kg/m  Wt Readings from Last 3 Encounters:  10/03/23 186 lb 0.6 oz (84.4 kg)  06/03/23 179 lb (81.2 kg)  02/10/23 176 lb 2.4 oz (79.9 kg)    Lab Results  Component Value Date   TSH 1.790 10/05/2023   Lab Results  Component Value Date   WBC 9.2 06/06/2023   HGB 16.0 (H)  06/06/2023   HCT 48.3 (H) 06/06/2023   MCV 92 06/06/2023   PLT 417 06/06/2023   Lab Results  Component Value Date   NA 140 10/05/2023   K 4.0 10/05/2023   CO2 19 (L) 10/05/2023   GLUCOSE 110 (H) 10/05/2023   BUN  10 10/05/2023   CREATININE 0.81 10/05/2023   BILITOT 0.2 10/05/2023   ALKPHOS 104 10/05/2023   AST 13 10/05/2023   ALT 11 10/05/2023   PROT 6.9 10/05/2023   ALBUMIN 4.6 10/05/2023   CALCIUM  10.0 10/05/2023   ANIONGAP 13 01/07/2014   EGFR 88 10/05/2023   Lab Results  Component Value Date   CHOL 194 10/05/2023   Lab Results  Component Value Date   HDL 51 10/05/2023   Lab Results  Component Value Date   LDLCALC 103 (H) 10/05/2023   Lab Results  Component Value Date   TRIG 232 (H) 10/05/2023   Lab Results  Component Value Date   CHOLHDL 3.8 10/05/2023   Lab Results  Component Value Date   HGBA1C 5.7 (H) 10/05/2023      Assessment & Plan:  Hemoptysis Assessment & Plan: Referral placed to Pulmonology for low-dose lung cancer screening. The patient was encouraged to stop by [facility] to obtain a chest X-ray to assess for possible infection. Advised to monitor for symptoms such as unintentional weight loss, shortness of breath, hoarseness, fatigue, or weakness.   Orders: -     DG Chest 2 View -     Ambulatory Referral for Lung Cancer Scre  IFG (impaired fasting glucose) -     Hemoglobin A1c  Vitamin D  deficiency -     VITAMIN D  25 Hydroxy (Vit-D Deficiency, Fractures)  TSH (thyroid-stimulating hormone deficiency) -     TSH + free T4  Other hyperlipidemia -     Lipid panel -     CMP14+EGFR  Note: This chart has been completed using Engineer, civil (consulting) software, and while attempts have been made to ensure accuracy, certain words and phrases may not be transcribed as intended.    Follow-up: Return in about 4 months (around 02/02/2024).   Donyale Berthold, FNP

## 2023-10-03 NOTE — Patient Instructions (Addendum)
 I appreciate the opportunity to provide care to you today!    Follow up:  4 months  Labs: please stop by the lab today/ during the week to get your blood drawn (CBC, CMP, TSH, Lipid profile, HgA1c, Vit D)   Hemoptysis: -Please stop by [facility] to obtain a chest X-ray to assess for possible infection. -A referral has been placed to Pulmonology for a low-dose CT scan of the lungs to evaluate for potential underlying causes, including lung cancer.  Supportive Measures: -Avoid strenuous activity and forceful coughing. -Maintain adequate hydration to help loosen mucus. Monitor for worsening symptoms, including: Shortness of breath Chest pain Dizziness Increased volume of blood in sputum   Please follow up if your symptoms worsen or fail to improve.   Referrals today- Pulmonary  Please continue to a heart-healthy diet and increase your physical activities. Try to exercise for at least five days a week.    It was a pleasure to see you and I look forward to continuing to work together on your health and well-being. Please do not hesitate to call the office if you need care or have questions about your care.  In case of emergency, please visit the Emergency Department for urgent care, or contact our clinic at [phone] to schedule an appointment. We're here to help you!   Have a wonderful day and week. With Gratitude, Jordan Pardini MSN, FNP-BC

## 2023-10-06 DIAGNOSIS — R042 Hemoptysis: Secondary | ICD-10-CM | POA: Insufficient documentation

## 2023-10-06 LAB — CMP14+EGFR
ALT: 11 IU/L (ref 0–32)
AST: 13 IU/L (ref 0–40)
Albumin: 4.6 g/dL (ref 3.8–4.9)
Alkaline Phosphatase: 104 IU/L (ref 44–121)
BUN/Creatinine Ratio: 12 (ref 9–23)
BUN: 10 mg/dL (ref 6–24)
Bilirubin Total: 0.2 mg/dL (ref 0.0–1.2)
CO2: 19 mmol/L — ABNORMAL LOW (ref 20–29)
Calcium: 10 mg/dL (ref 8.7–10.2)
Chloride: 104 mmol/L (ref 96–106)
Creatinine, Ser: 0.81 mg/dL (ref 0.57–1.00)
Globulin, Total: 2.3 g/dL (ref 1.5–4.5)
Glucose: 110 mg/dL — ABNORMAL HIGH (ref 70–99)
Potassium: 4 mmol/L (ref 3.5–5.2)
Sodium: 140 mmol/L (ref 134–144)
Total Protein: 6.9 g/dL (ref 6.0–8.5)
eGFR: 88 mL/min/{1.73_m2} (ref >59)

## 2023-10-06 LAB — VITAMIN D 25 HYDROXY (VIT D DEFICIENCY, FRACTURES): Vit D, 25-Hydroxy: 45 ng/mL (ref 30.0–100.0)

## 2023-10-06 LAB — LIPID PANEL
Chol/HDL Ratio: 3.8 ratio (ref 0.0–4.4)
Cholesterol, Total: 194 mg/dL (ref 100–199)
HDL: 51 mg/dL (ref >39)
LDL Chol Calc (NIH): 103 mg/dL — ABNORMAL HIGH (ref 0–99)
Triglycerides: 232 mg/dL — ABNORMAL HIGH (ref 0–149)
VLDL Cholesterol Cal: 40 mg/dL (ref 5–40)

## 2023-10-06 LAB — TSH+FREE T4
Free T4: 0.9 ng/dL (ref 0.82–1.77)
TSH: 1.79 u[IU]/mL (ref 0.450–4.500)

## 2023-10-06 LAB — HEMOGLOBIN A1C
Est. average glucose Bld gHb Est-mCnc: 117 mg/dL
Hgb A1c MFr Bld: 5.7 % — ABNORMAL HIGH (ref 4.8–5.6)

## 2023-10-06 NOTE — Assessment & Plan Note (Signed)
 Referral placed to Pulmonology for low-dose lung cancer screening. The patient was encouraged to stop by [facility] to obtain a chest X-ray to assess for possible infection. Advised to monitor for symptoms such as unintentional weight loss, shortness of breath, hoarseness, fatigue, or weakness.

## 2023-10-08 ENCOUNTER — Other Ambulatory Visit: Payer: Self-pay

## 2023-10-08 DIAGNOSIS — G8929 Other chronic pain: Secondary | ICD-10-CM

## 2023-10-09 ENCOUNTER — Other Ambulatory Visit (INDEPENDENT_AMBULATORY_CARE_PROVIDER_SITE_OTHER): Payer: Self-pay | Admitting: Gastroenterology

## 2023-10-10 ENCOUNTER — Ambulatory Visit: Payer: Self-pay | Admitting: Family Medicine

## 2023-10-10 NOTE — Progress Notes (Signed)
 Please inform the patient to continue her current treatment regimen as prescribed. Her cholesterol levels have improved, and her hemoglobin A1c has decreased to 5.7. I recommended that she maintain her lifestyle modifications, including adherence to a heart-healthy diet rich in whole grains, fruits, vegetables, and lean protein, along with increased physical activity.

## 2023-10-11 NOTE — Progress Notes (Signed)
 Please inform the patient that the chest X-ray did not show any current problems with the heart or lungs.  There are no signs of infection (like pneumonia), fluid buildup, lung collapse, or enlarged heart. Everything looks normal at the time of the X-ray.

## 2023-10-13 ENCOUNTER — Other Ambulatory Visit: Payer: Self-pay | Admitting: Family Medicine

## 2023-10-13 DIAGNOSIS — R042 Hemoptysis: Secondary | ICD-10-CM

## 2023-10-19 ENCOUNTER — Other Ambulatory Visit: Payer: Self-pay

## 2023-10-19 DIAGNOSIS — G43009 Migraine without aura, not intractable, without status migrainosus: Secondary | ICD-10-CM

## 2023-10-23 ENCOUNTER — Other Ambulatory Visit: Payer: Self-pay | Admitting: Family Medicine

## 2023-10-23 DIAGNOSIS — G8929 Other chronic pain: Secondary | ICD-10-CM

## 2023-10-24 ENCOUNTER — Ambulatory Visit (HOSPITAL_COMMUNITY): Admitting: Registered Nurse

## 2023-10-24 ENCOUNTER — Encounter (HOSPITAL_COMMUNITY): Payer: Self-pay | Admitting: Registered Nurse

## 2023-10-24 VITALS — BP 124/85 | HR 107 | Ht 68.0 in | Wt 183.4 lb

## 2023-10-24 DIAGNOSIS — F3130 Bipolar disorder, current episode depressed, mild or moderate severity, unspecified: Secondary | ICD-10-CM | POA: Diagnosis not present

## 2023-10-24 DIAGNOSIS — F411 Generalized anxiety disorder: Secondary | ICD-10-CM

## 2023-10-24 MED ORDER — BUSPIRONE HCL 5 MG PO TABS: 5.0000 mg | ORAL_TABLET | Freq: Three times a day (TID) | ORAL | 0 refills | Status: DC

## 2023-10-24 MED ORDER — CARIPRAZINE HCL 1.5 MG PO CAPS: 1.5000 mg | ORAL_CAPSULE | Freq: Every day | ORAL | 0 refills | Status: DC

## 2023-10-24 MED ORDER — HYDROXYZINE HCL 10 MG PO TABS
ORAL_TABLET | ORAL | 0 refills | Status: DC
Start: 2023-10-24 — End: 2023-11-28

## 2023-10-24 NOTE — Patient Instructions (Addendum)
 Safety Plan Nichole [name] will reach out to her husband Nichole [name]), call [phone], mobile crisis or present to the nearest emergency room should she experiences any suicidal/homicidal ideation, auditory/visual/hallucinations, or detrimental worsening of her mental health.  Joie will follow up with Luisa Ruder, NP for medication management in 2 weeks and Winton Rubinstein, LCSW on 01/17/2024 at 10:00 AM for counseling/therapy.      The suicide prevention education provided includes the following: Suicide risk factors Suicide prevention and interventions National Suicide Hotline telephone number [facility] assessment telephone number [facility] Emergency Assistance 911 [facility] and/or Residential Mobile Crisis Unit telephone number Request made to Evian to share with husband Remove weapons (e.g., guns, rifles, knives), all items previously/currently identified as safety concern.   Remove drugs/medications (over the counter, prescriptions, illicit drugs), all items previously/currently identified as a safety concern.   Mobile Crisis Response Teams Listed by counties in vicinity of [facility] providers [facility] [phone] [facility] [phone] [facility] [phone] [facility] [phone] [place]                * [facility] Recovery [phone]                * [facility] [phone]
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
## 2023-10-26 ENCOUNTER — Ambulatory Visit (HOSPITAL_COMMUNITY)
Admission: RE | Admit: 2023-10-26 | Discharge: 2023-10-26 | Disposition: A | Discharge status: Home / Self Care | Source: Ambulatory Visit | Attending: Family Medicine | Admitting: Family Medicine

## 2023-10-26 ENCOUNTER — Telehealth (HOSPITAL_COMMUNITY): Payer: Self-pay

## 2023-10-26 DIAGNOSIS — R042 Hemoptysis: Secondary | ICD-10-CM | POA: Diagnosis present

## 2023-10-26 NOTE — Telephone Encounter (Signed)
 Prior authorization for pt's Vraylar 1.5 MG Capsule approved from 10/26/23 to 10/25/24

## 2023-11-02 ENCOUNTER — Other Ambulatory Visit: Payer: Self-pay | Admitting: Family Medicine

## 2023-11-02 DIAGNOSIS — G8929 Other chronic pain: Secondary | ICD-10-CM

## 2023-11-06 ENCOUNTER — Other Ambulatory Visit (INDEPENDENT_AMBULATORY_CARE_PROVIDER_SITE_OTHER): Payer: Self-pay | Admitting: Gastroenterology

## 2023-11-07 ENCOUNTER — Ambulatory Visit (HOSPITAL_COMMUNITY): Admitting: Registered Nurse

## 2023-11-07 ENCOUNTER — Ambulatory Visit: Payer: Self-pay | Admitting: Family Medicine

## 2023-11-21 ENCOUNTER — Ambulatory Visit: Admitting: Dermatology

## 2023-11-21 ENCOUNTER — Encounter: Payer: Self-pay | Admitting: Dermatology

## 2023-11-21 DIAGNOSIS — D485 Neoplasm of uncertain behavior of skin: Secondary | ICD-10-CM

## 2023-11-21 DIAGNOSIS — L821 Other seborrheic keratosis: Secondary | ICD-10-CM | POA: Diagnosis not present

## 2023-11-21 DIAGNOSIS — D489 Neoplasm of uncertain behavior, unspecified: Secondary | ICD-10-CM

## 2023-11-21 NOTE — Patient Instructions (Signed)

## 2023-11-21 NOTE — Progress Notes (Signed)
   Follow-Up Visit   Subjective  Nichole [name] is a 53 y.o. female who presents for the following: Lesion on the left ear has been there for more than 3 years. The lesion has grown and it burns.  She was seen by a dermatologist when it first appeared and was told it was an age spot. Not previously treated.   The following portions of the chart were reviewed this encounter and updated as appropriate: medications, allergies, medical history  Review of Systems:  No other skin or systemic complaints except as noted in HPI or Assessment and Plan.  Objective  Well appearing patient in no apparent distress; mood and affect are within normal limits.  A focused examination was performed of the following areas: Left ear  Relevant exam findings are noted in the Assessment and Plan.  Left Superior Helix 3cm cerebriform plaque   Assessment & Plan    NEOPLASM OF UNCERTAIN BEHAVIOR Left Superior Helix Epidermal / dermal shaving  Lesion diameter (cm):  3 Informed consent: discussed and consent obtained   Timeout: patient name, date of birth, surgical site, and procedure verified   Procedure prep:  Patient was prepped and draped in usual sterile fashion Prep type:  Isopropyl alcohol Anesthesia: the lesion was anesthetized in a standard fashion   Anesthetic:  1% lidocaine  w/ epinephrine  1-100,000 buffered w/ 8.4% NaHCO3 Instrument used: DermaBlade   Hemostasis achieved with: pressure, aluminum chloride and electrodesiccation   Outcome: patient tolerated procedure well   Post-procedure details: sterile dressing applied and wound care instructions given   Dressing type: bandage and petrolatum     SEBORRHEIC KERATOSIS - Stuck-on, waxy, tan-brown papules and/or plaques  - Benign-appearing - Discussed benign etiology and prognosis. - Observe - Call for any changes   Return for skin exam.  I, Rollene Gobble, RN, am acting as scribe for RUFUS CHRISTELLA HOLY, MD .   Documentation: I have  reviewed the above documentation for accuracy and completeness, and I agree with the above.  RUFUS CHRISTELLA HOLY, MD

## 2023-11-22 ENCOUNTER — Ambulatory Visit: Payer: Self-pay | Admitting: Dermatology

## 2023-11-22 LAB — SURGICAL PATHOLOGY

## 2023-11-24 ENCOUNTER — Other Ambulatory Visit: Payer: Self-pay

## 2023-11-24 DIAGNOSIS — G43009 Migraine without aura, not intractable, without status migrainosus: Secondary | ICD-10-CM

## 2023-11-28 ENCOUNTER — Ambulatory Visit (INDEPENDENT_AMBULATORY_CARE_PROVIDER_SITE_OTHER): Admitting: Registered Nurse

## 2023-11-28 ENCOUNTER — Encounter (HOSPITAL_COMMUNITY): Payer: Self-pay | Admitting: Registered Nurse

## 2023-11-28 DIAGNOSIS — F3130 Bipolar disorder, current episode depressed, mild or moderate severity, unspecified: Secondary | ICD-10-CM | POA: Diagnosis not present

## 2023-11-28 DIAGNOSIS — F411 Generalized anxiety disorder: Secondary | ICD-10-CM

## 2023-11-28 MED ORDER — CARIPRAZINE HCL 3 MG PO CAPS: 3.0000 mg | ORAL_CAPSULE | Freq: Every day | ORAL | 2 refills | Status: DC

## 2023-11-28 MED ORDER — HYDROXYZINE HCL 10 MG PO TABS: ORAL_TABLET | ORAL | 2 refills | Status: DC

## 2023-11-28 MED ORDER — BUSPIRONE HCL 7.5 MG PO TABS: 7.5000 mg | ORAL_TABLET | Freq: Three times a day (TID) | ORAL | 2 refills | Status: DC

## 2023-11-28 NOTE — Progress Notes (Signed)
 [facility] MD/PA/NP OP Progress Note  11/28/2023 11:26 AM AZARIYAH LUHRS  MRN:  [id number]  Chief Complaint:  Chief Complaint  Patient presents with   Follow-up    Medication management   HPI: JARICA PLASS 53 y.o. female presents to office today accompanied by her husband for medication management follow up.  She gives permission for her husband is sitting and doing assessment.  She is seen face to face by this provider, and chart reviewed on 11/28/23.  Her psychiatric history is significant for Bipolar 1 disorder, PTSD, manic depression, general anxiety, and panic attack.  Her mental health is currently managed with BuSpar  5 mg 3 times daily, Vraylar  1.5 mg daily, and Vistaril  10-20 mg 3 times daily as needed.  She reports current medication is managing her mental health somewhat without any adverse reactions.  She reports she has noticed some improvement in her depression/anxiety, and mood but does not feel that she is stable. Today suicidal/self-harm/homicidal ideation, psychosis, paranoia, and abnormal movement.    Recommended the following: Increase Vraylar  3 mg daily, increase BuSpar  7.5 mg 3 times daily, and continue Vistaril  10-20 mg 3 times daily as needed.  She voices understanding/ agreement with information/recommendations being given to her today.    Visit Diagnosis:    ICD-10-CM   1. Generalized anxiety disorder  F41.1 cariprazine  (VRAYLAR ) 3 MG capsule    busPIRone  (BUSPAR ) 7.5 MG tablet    2. Bipolar disorder, most recent episode depressed (HCC)  F31.30 cariprazine  (VRAYLAR ) 3 MG capsule      Past Psychiatric History: Bipolar 1 disorder, PTSD, manic depression, general anxiety, and panic attack  Previous Psychotropic Medications: Yes , but unable to give the names of medications other than Xanax  and Klonopin  Past Medical History:  Past Medical History:  Diagnosis Date   Arthritis    Asthma    Bipolar 1 disorder (HCC)    COPD (chronic obstructive pulmonary disease) (HCC)     COPD (chronic obstructive pulmonary disease) (HCC)    Heart attack (HCC) 2012   stress induced   Manic depression (HCC)    Panic attacks    Sciatica     Past Surgical History:  Procedure Laterality Date   BIOPSY  02/10/2023   Procedure: BIOPSY;  Surgeon: Cinderella Deatrice FALCON, MD;  [facility];  Service: Endoscopy;;   CESAREAN SECTION     COLONOSCOPY WITH PROPOFOL  N/A 02/10/2023   Procedure: COLONOSCOPY WITH PROPOFOL ;  Surgeon: Cinderella Deatrice FALCON, MD;  Location: [facility];  Service: Endoscopy;  Laterality: N/A;  7:30am;asa 3   cyst removed from wrist     ESOPHAGEAL DILATION  02/10/2023   Procedure: ESOPHAGEAL DILATION;  Surgeon: Cinderella Deatrice FALCON, MD;  [facility];  Service: Endoscopy;;   ESOPHAGOGASTRODUODENOSCOPY (EGD) WITH PROPOFOL  N/A 02/10/2023   Procedure: ESOPHAGOGASTRODUODENOSCOPY (EGD) WITH PROPOFOL ;  Surgeon: Cinderella Deatrice FALCON, MD;  [facility];  Service: Endoscopy;  Laterality: N/A;  7:30am;asa 3   POLYPECTOMY  02/10/2023   Procedure: POLYPECTOMY;  Surgeon: Cinderella Deatrice FALCON, MD;  [facility];  Service: Endoscopy;;   TONSILLECTOMY     TUBAL LIGATION      Family Psychiatric History: Reports a significant history of mental illness in family but she is unaware of who has what.    Family History:  Family History  Problem Relation Age of Onset   Heart disease Mother    Mental illness Mother    Hypertension Mother    Lung disease  Father    Heart disease Father    Lung cancer Father    Cancer Brother    Colon cancer Brother    Skin cancer Brother    Lung cancer Brother    Breast cancer Maternal Grandmother    Thyroid cancer Maternal Grandmother     Social History:  Social History   Socioeconomic History   Marital status: Married    Spouse name: Not on file   Number of children: Not on file   Years of education: Not on file   Highest education level: Not on file  Occupational History   Not on file  Tobacco Use    Smoking status: Every Day    Current packs/day: 2.00    Average packs/day: 2.0 packs/day for 44.0 years (88.0 ttl pk-yrs)    Types: Cigarettes    Start date: 11/28/1979    Passive exposure: Current   Smokeless tobacco: Never  Substance and Sexual Activity   Alcohol use: Not Currently    Comment: occ   Drug use: No   Sexual activity: Yes    Partners: Male    Birth control/protection: None  Other Topics Concern   Not on file  Social History Narrative   Not on file   Social Drivers of Health   Financial Resource Strain: Not on file  Food Insecurity: Not on file  Transportation Needs: Not on file  Physical Activity: Not on file  Stress: Not on file  Social Connections: Not on file    Allergies:  Allergies  Allergen Reactions   Bee Venom Anaphylaxis and Hives   Alka-Seltzer [Aspirin  Effervescent]    Percocet Deane.Dam ]     headache    Metabolic Disorder Labs: Lab Results  Component Value Date   HGBA1C 5.7 (H) 10/05/2023   No results found for: PROLACTIN Lab Results  Component Value Date   CHOL 194 10/05/2023   TRIG 232 (H) 10/05/2023   HDL 51 10/05/2023   CHOLHDL 3.8 10/05/2023   LDLCALC 103 (H) 10/05/2023   LDLCALC 111 (H) 06/06/2023   Lab Results  Component Value Date   TSH 1.790 10/05/2023   TSH 2.100 06/06/2023    Current Medications: Current Outpatient Medications  Medication Sig Dispense Refill   baclofen  (LIORESAL ) 10 MG tablet TAKE 1 TABLET BY MOUTH THREE TIMES DAILY 30 tablet 0   Budeson-Glycopyrrol-Formoterol (BREZTRI  AEROSPHERE) 160-9-4.8 MCG/ACT AERO Inhale 2 puffs into the lungs 2 (two) times daily. 10.7 g 11   diphenhydrAMINE  (BENADRYL ) 25 mg capsule Take 25 mg by mouth as needed for itching.     EPINEPHrine  (EPIPEN  2-PAK) 0.3 mg/0.3 mL IJ SOAJ injection Inject 0.3 mg into the muscle as needed for anaphylaxis. 1 each 0   NURTEC 75 MG TBDP DISSOLVE 1 TABLET BY MOUTH AS NEEDED 16 tablet 0   pantoprazole  (PROTONIX ) 40 MG tablet  Take 1 tablet by mouth once daily 30 tablet 0   Riboflavin  400 MG CAPS Take 1 capsule (400 mg total) by mouth daily. 30 capsule 2   rosuvastatin  (CRESTOR ) 10 MG tablet Take 1 tablet (10 mg total) by mouth daily. 90 tablet 3   VENTOLIN  HFA 108 (90 Base) MCG/ACT inhaler INHALE 2 PUFFS BY MOUTH EVERY 6 HOURS AS NEEDED FOR WHEEZING FOR SHORTNESS OF BREATH 18 g 0   Vitamin D , Ergocalciferol , (DRISDOL ) 1.25 MG (50000 UNIT) CAPS capsule Take 1 capsule (50,000 Units total) by mouth every 7 (seven) days. 20 capsule 1   busPIRone  (BUSPAR ) 7.5 MG tablet Take 1 tablet (  7.5 mg total) by mouth 3 (three) times daily. 90 tablet 2   cariprazine  (VRAYLAR ) 3 MG capsule Take 1 capsule (3 mg total) by mouth daily. 30 capsule 2   hydrOXYzine  (ATARAX ) 10 MG tablet Tale 10-20 mg (1-2 tablets) three times daily as needed for anxiety/sleep 60 tablet 2   No current facility-administered medications for this visit.    Musculoskeletal: Strength & Muscle Tone: within normal limits Gait & Station: normal Patient leans: N/A  Psychiatric Specialty Exam: Review of Systems  Constitutional:        No complaints voiced at this time  Psychiatric/Behavioral:  Positive for dysphoric mood (Has noticed some improvement but feels she could be better) and sleep disturbance (Reports she does not feel she needs any medication to assist with sleep). Negative for hallucinations, self-injury and suicidal ideas. Agitation: Some improvement with irritability.The patient is nervous/anxious (Has noticed some improvement but feels she could be better).   All other systems reviewed and are negative.   Blood pressure 122/85, pulse (!) 108, temperature 98.4 F (36.9 C), temperature source Oral, height 5' 6 (1.676 m), weight 181 lb (82.1 kg), SpO2 96%.Body mass index is 29.21 kg/m.  General Appearance: Casual  Eye Contact:  Good  Speech:  Clear and Coherent and Normal Rate  Volume:  Normal  Mood:  Anxious and Euthymic  Affect:  Appropriate  and Congruent  Thought Process:  Coherent, Goal Directed, and Descriptions of Associations: Intact  Orientation:  Full (Time, Place, and Person)  Thought Content: WDL and Logical   Suicidal Thoughts:  No  Homicidal Thoughts:  No  Memory:  Immediate;   Good Recent;   Good Remote;   Good  Judgement:  Intact  Insight:  Present  Psychomotor Activity:  Normal  Concentration:  Concentration: Good and Attention Span: Good  Recall:  Good  Fund of Knowledge: Good  Language: Good  Akathisia:  No  Handed:  Right  AIMS (if indicated): not done  Assets:  Communication Skills Desire for Improvement Financial Resources/Insurance Housing Leisure Time Resilience Social Support Transportation  ADL's:  Intact  Cognition: WNL  Sleep:  Fair   Screenings: AIMS    Flowsheet Row Office Visit from 10/24/2023 in [facility] [place]  AIMS Total Score 0   GAD-7    Flowsheet Row Office Visit from 10/24/2023 in [facility] [place] Office Visit from 10/03/2023 in [facility] Office Visit from 01/31/2023 in [facility] Office Visit from 12/29/2022 in [facility]  Total GAD-7 Score 15 16 0 18   PHQ2-9    Flowsheet Row Office Visit from 10/24/2023 in [facility] [place] Office Visit from 10/03/2023 in [facility] Office Visit from 06/03/2023 in [facility] Office Visit from 01/31/2023 in [facility] Office Visit from 12/29/2022 in [facility]  PHQ-2 Total Score 4 5 5  0 3  PHQ-9 Total Score 15 16 21  0 15   Flowsheet Row Office Visit from 10/24/2023 in [facility] [place] Office Visit from 06/03/2023 in [facility] Admission (Discharged) from 02/10/2023 in [facility]  C-SSRS RISK  CATEGORY Moderate Risk Error: Q3, 4, or 5 should not be populated when Q2 is No No Risk   Assessment and Plan: Assessment: Patient seen and examined as noted above. Summary: Today Joen NOVAK  [place] appears to be doing fairly well.  She reports her medications are somewhat managing her mental health without any adverse reaction but feels that she could be doing better.  She denies suicidal/self-harm/homicidal ideation, psychosis, paranoia, and abnormal movements.  Reports she is eating without any difficulty. During visit she is dressed appropriate for age and weather.  She is seated comfortably in chair with no noted distress.  She is alert/oriented x 4, calm/cooperative and mood is congruent with affect.  She spoke in a clear tone at moderate volume, and normal pace, with good eye contact.  Her thought process is coherent, relevant, and there is no indication that she is currently responding to internal/external stimuli or experiencing delusional thought content.    1. Generalized anxiety disorder - cariprazine  (VRAYLAR ) 3 MG capsule; Take 1 capsule (3 mg total) by mouth daily.  Dispense: 30 capsule; Refill: 2 - busPIRone  (BUSPAR ) 7.5 MG tablet; Take 1 tablet (7.5 mg total) by mouth 3 (three) times daily.  Dispense: 90 tablet; Refill: 2  2. Bipolar disorder, most recent episode depressed (HCC) - cariprazine  (VRAYLAR ) 3 MG capsule; Take 1 capsule (3 mg total) by mouth daily.  Dispense: 30 capsule; Refill: 2  Plan: Medications: Meds ordered this encounter  Medications   cariprazine  (VRAYLAR ) 3 MG capsule    Sig: Take 1 capsule (3 mg total) by mouth daily.    Dispense:  30 capsule    Refill:  2    Supervising Provider:   CURRY, SYED T [[id number]]   busPIRone  (BUSPAR ) 7.5 MG tablet    Sig: Take 1 tablet (7.5 mg total) by mouth 3 (three) times daily.    Dispense:  90 tablet    Refill:  2    Supervising Provider:   ARFEEN, SYED T [[id number]]   hydrOXYzine  (ATARAX ) 10 MG tablet    Sig: Tale 10-20 mg (1-2  tablets) three times daily as needed for anxiety/sleep    Dispense:  60 tablet    Refill:  2    Supervising Provider:   CURRY LENI DASEN [[id number]]   Labs:  Not indicated at this time  Other:  Keep scheduled appointment with Winton Rubinstein, LCSW for counseling/therapy on 01/17/2024 at 10 AM.   Joen KATHEE Fair is instructed to continue established safety plan Safety Plan EMORI MUMME will reach out to [name]), call [phone], mobile crisis or present to the nearest emergency room should she experiences any suicidal/homicidal ideation, auditory/visual/hallucinations, or detrimental worsening of her mental health.  Tae will follow up with Luisa Ruder, NP for medication management and Winton Rubinstein, LCSW for counseling/therapy.      The suicide prevention education provided includes the following: Suicide risk factors Suicide prevention and interventions National Suicide Hotline telephone number [facility] assessment telephone number [facility] Emergency Assistance 911 [facility] and/or Residential Mobile Crisis Unit telephone number Request made to Lynsey to share with husband Remove weapons (e.g., guns, rifles, knives), all items previously/currently identified as safety concern.   Remove drugs/medications (over the counter, prescriptions, illicit drugs), all items previously/currently identified as a safety concern. She is encouraged/advised to reduce cigarette and to consider quitting Joen KATHEE Fair participated in the development of this treatment plan and verbalized her understanding/agreement with plan as listed.  Follow Up: Return in 2 months for medication management Call in the interim for any side-effects, decompensation, questions, or problems  Collaboration of Care: Collaboration of Care: Medication Management AEB medication assessment, adjustment, and refills  Patient/Guardian was  advised Release of Information must be obtained prior to any record  release in order to collaborate their care with an outside provider. Patient/Guardian was advised if they have not already done so to contact the registration department to sign all necessary forms in order for us  to release information regarding their care.   Consent: Patient/Guardian gives verbal consent for treatment and assignment of benefits for services provided during this visit. Patient/Guardian expressed understanding and agreed to proceed.    Zoeya Gramajo, NP 11/28/2023, 11:26 AM

## 2023-11-28 NOTE — Patient Instructions (Signed)

## 2023-12-09 ENCOUNTER — Other Ambulatory Visit (HOSPITAL_COMMUNITY): Payer: Self-pay

## 2023-12-09 ENCOUNTER — Telehealth: Payer: Self-pay | Admitting: Pharmacy Technician

## 2023-12-09 NOTE — Telephone Encounter (Signed)
 Pharmacy Patient Advocate Encounter   Received notification from CoverMyMeds that prior authorization for Nurtec 75MG  dispersible tablets is due for renewal.   Insurance verification completed.   The patient is insured through [facility].  Action: PA required; PA submitted to above mentioned insurance via LATENT Key/confirmation #/EOC [id number] Status is pending

## 2023-12-09 NOTE — Telephone Encounter (Signed)
 Pharmacy Patient Advocate Encounter  Received notification from [facility] that Prior Authorization for Nurtec 75MG  dispersible tablets  has been APPROVED from 12/09/2023 to 12/08/2024.   PA #/Case ID/Reference #: [id number]

## 2023-12-12 ENCOUNTER — Other Ambulatory Visit (HOSPITAL_COMMUNITY): Payer: Self-pay

## 2023-12-13 ENCOUNTER — Other Ambulatory Visit (INDEPENDENT_AMBULATORY_CARE_PROVIDER_SITE_OTHER): Payer: Self-pay | Admitting: Gastroenterology

## 2023-12-20 ENCOUNTER — Other Ambulatory Visit (HOSPITAL_COMMUNITY): Payer: Self-pay

## 2023-12-26 ENCOUNTER — Other Ambulatory Visit (HOSPITAL_COMMUNITY): Payer: Self-pay

## 2023-12-31 ENCOUNTER — Other Ambulatory Visit: Payer: Self-pay | Admitting: Family Medicine

## 2023-12-31 DIAGNOSIS — G8929 Other chronic pain: Secondary | ICD-10-CM

## 2024-01-09 ENCOUNTER — Other Ambulatory Visit: Payer: Self-pay | Admitting: Family Medicine

## 2024-01-09 DIAGNOSIS — J4489 Other specified chronic obstructive pulmonary disease: Secondary | ICD-10-CM

## 2024-01-11 ENCOUNTER — Ambulatory Visit: Admitting: Dermatology

## 2024-01-17 ENCOUNTER — Ambulatory Visit (HOSPITAL_COMMUNITY): Admitting: Psychiatry

## 2024-01-17 ENCOUNTER — Encounter (HOSPITAL_COMMUNITY): Payer: Self-pay | Admitting: Psychiatry

## 2024-01-17 DIAGNOSIS — F411 Generalized anxiety disorder: Secondary | ICD-10-CM | POA: Diagnosis not present

## 2024-01-17 DIAGNOSIS — F319 Bipolar disorder, unspecified: Secondary | ICD-10-CM

## 2024-01-17 DIAGNOSIS — F3130 Bipolar disorder, current episode depressed, mild or moderate severity, unspecified: Secondary | ICD-10-CM
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
## 2024-01-19 ENCOUNTER — Other Ambulatory Visit (INDEPENDENT_AMBULATORY_CARE_PROVIDER_SITE_OTHER): Payer: Self-pay | Admitting: Gastroenterology

## 2024-01-30 ENCOUNTER — Ambulatory Visit (HOSPITAL_COMMUNITY): Admitting: Registered Nurse

## 2024-02-02 ENCOUNTER — Encounter: Payer: Self-pay | Admitting: Family Medicine

## 2024-02-02 ENCOUNTER — Ambulatory Visit: Admitting: Family Medicine

## 2024-02-02 DIAGNOSIS — E559 Vitamin D deficiency, unspecified: Secondary | ICD-10-CM

## 2024-02-02 DIAGNOSIS — G43009 Migraine without aura, not intractable, without status migrainosus: Secondary | ICD-10-CM | POA: Diagnosis not present

## 2024-02-02 DIAGNOSIS — G8929 Other chronic pain: Secondary | ICD-10-CM

## 2024-02-02 DIAGNOSIS — I10 Essential (primary) hypertension: Secondary | ICD-10-CM | POA: Diagnosis not present

## 2024-02-02 DIAGNOSIS — E7849 Other hyperlipidemia: Secondary | ICD-10-CM

## 2024-02-02 DIAGNOSIS — K219 Gastro-esophageal reflux disease without esophagitis: Secondary | ICD-10-CM | POA: Diagnosis not present

## 2024-02-02 DIAGNOSIS — M542 Cervicalgia: Secondary | ICD-10-CM

## 2024-02-02 DIAGNOSIS — J4489 Other specified chronic obstructive pulmonary disease: Secondary | ICD-10-CM

## 2024-02-02 DIAGNOSIS — E038 Other specified hypothyroidism: Secondary | ICD-10-CM

## 2024-02-02 DIAGNOSIS — R7301 Impaired fasting glucose: Secondary | ICD-10-CM

## 2024-02-02 MED ORDER — VENTOLIN HFA 108 (90 BASE) MCG/ACT IN AERS: 1.0000 | INHALATION_SPRAY | Freq: Four times a day (QID) | RESPIRATORY_TRACT | 2 refills | Status: AC | PRN

## 2024-02-02 MED ORDER — PANTOPRAZOLE SODIUM 40 MG PO TBEC: 40.0000 mg | DELAYED_RELEASE_TABLET | Freq: Every day | ORAL | 2 refills | Status: AC

## 2024-02-02 MED ORDER — OLMESARTAN MEDOXOMIL 20 MG PO TABS: 20.0000 mg | ORAL_TABLET | Freq: Every day | ORAL | 1 refills | Status: DC

## 2024-02-02 NOTE — Patient Instructions (Signed)
 I appreciate the opportunity to provide care to you today!    Follow up:  5 months  Fasting Labs: please stop by the lab today to get your blood drawn (CBC, CMP, TSH, Lipid profile, HgA1c, Vit D) For a Healthier YOU, I Recommend: Reducing your intake of sugar, sodium, carbohydrates, and saturated fats. Increasing your fiber intake by incorporating more whole grains, fruits, and vegetables into your meals. Setting healthy goals with a focus on lowering your consumption of carbs, sugar, and unhealthy fats. Adding variety to your diet by including a wide range of fruits and vegetables. Cutting back on soda and limiting processed foods as much as possible. Staying active: In addition to taking your weight loss medication, aim for at least 150 minutes of moderate-intensity physical activity each week for optimal results.    Please follow up if your symptoms worsen or fail to improve.     Please continue to a heart-healthy diet and increase your physical activities. Try to exercise for at least five days a week.    It was a pleasure to see you and I look forward to continuing to work together on your health and well-being. Please do not hesitate to call the office if you need care or have questions about your care.  In case of emergency, please visit the Emergency Department for urgent care, or contact our clinic at [phone] to schedule an appointment. We're here to help you!   Have a wonderful day and week. With Gratitude, Alixandra Alfieri MSN, FNP-BC

## 2024-02-02 NOTE — Progress Notes (Signed)
 Established Patient Office Visit  Subjective:  Patient ID: Nichole [name], female    DOB: 1970-10-10  Age: 53 y.o. MRN: [id number]  CC:  Chief Complaint  Patient presents with   Medical Management of Chronic Issues    4 month follow up     HPI Nichole [name] is a 53 y.o. female with past medical history of  Migraines, GAD presents for f/u of  chronic medical conditions.  For the details of today's visit, please refer to the assessment and plan.      BP Readings from Last 3 Encounters:  10/03/23 (!) 136/98  06/03/23 133/88  02/10/23 105/63    Past Medical History:  Diagnosis Date   Arthritis    Asthma    Bipolar 1 disorder (HCC)    COPD (chronic obstructive pulmonary disease) (HCC)    COPD (chronic obstructive pulmonary disease) (HCC)    Heart attack (HCC) 2012   stress induced   Manic depression (HCC)    Panic attacks    Sciatica     Past Surgical History:  Procedure Laterality Date   BIOPSY  02/10/2023   Procedure: BIOPSY;  Surgeon: Cinderella Deatrice FALCON, MD;  Location: [facility];  Service: Endoscopy;;   CESAREAN SECTION     COLONOSCOPY WITH PROPOFOL  N/A 02/10/2023   Procedure: COLONOSCOPY WITH PROPOFOL ;  Surgeon: Cinderella Deatrice FALCON, MD;  Location: [facility];  Service: Endoscopy;  Laterality: N/A;  7:30am;asa 3   cyst removed from wrist     ESOPHAGEAL DILATION  02/10/2023   Procedure: ESOPHAGEAL DILATION;  Surgeon: Cinderella Deatrice FALCON, MD;  Location: [facility];  Service: Endoscopy;;   ESOPHAGOGASTRODUODENOSCOPY (EGD) WITH PROPOFOL  N/A 02/10/2023   Procedure: ESOPHAGOGASTRODUODENOSCOPY (EGD) WITH PROPOFOL ;  Surgeon: Cinderella Deatrice FALCON, MD;  Location: [facility];  Service: Endoscopy;  Laterality: N/A;  7:30am;asa 3   POLYPECTOMY  02/10/2023   Procedure: POLYPECTOMY;  Surgeon: Cinderella Deatrice FALCON, MD;  Location: [facility];  Service: Endoscopy;;   TONSILLECTOMY     TUBAL LIGATION      Family History  Problem Relation Age of Onset   Heart disease Mother     Mental illness Mother    Hypertension Mother    Alcohol abuse Father    Lung disease Father    Heart disease Father    Lung cancer Father    Cancer Brother    Colon cancer Brother    Skin cancer Brother    Lung cancer Brother    Schizophrenia Maternal Aunt    Breast cancer Maternal Grandmother    Thyroid cancer Maternal Grandmother    Depression Son    Anxiety disorder Son     Social History   Socioeconomic History   Marital status: Married    Spouse name: Not on file   Number of children: Not on file   Years of education: Not on file   Highest education level: Not on file  Occupational History   Not on file  Tobacco Use   Smoking status: Every Day    Current packs/day: 2.00    Average packs/day: 2.0 packs/day for 44.2 years (88.4 ttl pk-yrs)    Types: Cigarettes    Start date: 11/28/1979    Passive exposure: Current   Smokeless tobacco: Never  Substance and Sexual Activity   Alcohol use: Not Currently    Comment: occ   Drug use: No   Sexual activity: Yes    Partners: Male    Birth control/protection:  None  Other Topics Concern   Not on file  Social History Narrative   Not on file   Social Drivers of Health   Financial Resource Strain: Not on file  Food Insecurity: Not on file  Transportation Needs: Not on file  Physical Activity: Not on file  Stress: Not on file  Social Connections: Not on file  Intimate Partner Violence: Not on file    Outpatient Medications Prior to Visit  Medication Sig Dispense Refill   baclofen  (LIORESAL ) 10 MG tablet TAKE 1 TABLET BY MOUTH THREE TIMES DAILY 30 tablet 2   BREZTRI  AEROSPHERE 160-9-4.8 MCG/ACT AERO inhaler Inhale 2 puffs by mouth twice daily 11 g 0   busPIRone  (BUSPAR ) 7.5 MG tablet Take 1 tablet (7.5 mg total) by mouth 3 (three) times daily. 90 tablet 2   cariprazine  (VRAYLAR ) 3 MG capsule Take 1 capsule (3 mg total) by mouth daily. 30 capsule 2   diphenhydrAMINE  (BENADRYL ) 25 mg capsule Take 25 mg by mouth as  needed for itching.     EPINEPHrine  (EPIPEN  2-PAK) 0.3 mg/0.3 mL IJ SOAJ injection Inject 0.3 mg into the muscle as needed for anaphylaxis. 1 each 0   hydrOXYzine  (ATARAX ) 10 MG tablet Tale 10-20 mg (1-2 tablets) three times daily as needed for anxiety/sleep 60 tablet 2   NURTEC 75 MG TBDP DISSOLVE 1 TABLET BY MOUTH AS NEEDED 16 tablet 0   rosuvastatin  (CRESTOR ) 10 MG tablet Take 1 tablet (10 mg total) by mouth daily. 90 tablet 3   Vitamin D , Ergocalciferol , (DRISDOL ) 1.25 MG (50000 UNIT) CAPS capsule Take 1 capsule (50,000 Units total) by mouth every 7 (seven) days. 20 capsule 1   pantoprazole  (PROTONIX ) 40 MG tablet Take 1 tablet by mouth once daily 30 tablet 0   VENTOLIN  HFA 108 (90 Base) MCG/ACT inhaler INHALE 2 PUFFS BY MOUTH EVERY 6 HOURS AS NEEDED FOR WHEEZING FOR SHORTNESS OF BREATH 18 g 0   Riboflavin  400 MG CAPS Take 1 capsule (400 mg total) by mouth daily. (Patient not taking: Reported on 02/02/2024) 30 capsule 2   No facility-administered medications prior to visit.    Allergies  Allergen Reactions   Bee Venom Anaphylaxis and Hives   Alka-Seltzer [Aspirin  Effervescent]    Percocet [Oxycodone-Acetaminophen ]     headache    ROS Review of Systems  Constitutional:  Negative for chills and fever.  Eyes:  Negative for visual disturbance.  Respiratory:  Negative for chest tightness and shortness of breath.   Neurological:  Negative for dizziness and headaches.      Objective:    Physical Exam HENT:     Head: Normocephalic.     Mouth/Throat:     Mouth: Mucous membranes are moist.  Cardiovascular:     Rate and Rhythm: Normal rate.     Heart sounds: Normal heart sounds.  Pulmonary:     Effort: Pulmonary effort is normal.     Breath sounds: Normal breath sounds.  Neurological:     Mental Status: She is alert.     There were no vitals taken for this visit. Wt Readings from Last 3 Encounters:  10/03/23 186 lb 0.6 oz (84.4 kg)  06/03/23 179 lb (81.2 kg)  02/10/23 176  lb 2.4 oz (79.9 kg)    Lab Results  Component Value Date   TSH 2.140 02/02/2024   Lab Results  Component Value Date   WBC 9.0 02/02/2024   HGB 15.6 02/02/2024   HCT 48.2 (H) 02/02/2024   MCV 95  02/02/2024   PLT 397 02/02/2024   Lab Results  Component Value Date   NA 142 02/02/2024   K 4.0 02/02/2024   CO2 21 02/02/2024   GLUCOSE 91 02/02/2024   BUN 7 02/02/2024   CREATININE 0.71 02/02/2024   BILITOT 0.3 02/02/2024   ALKPHOS 103 02/02/2024   AST 14 02/02/2024   ALT 12 02/02/2024   PROT 7.3 02/02/2024   ALBUMIN 4.6 02/02/2024   CALCIUM  9.8 02/02/2024   ANIONGAP 13 01/07/2014   EGFR 103 02/02/2024   Lab Results  Component Value Date   CHOL 190 02/02/2024   Lab Results  Component Value Date   HDL 50 02/02/2024   Lab Results  Component Value Date   LDLCALC 104 (H) 02/02/2024   Lab Results  Component Value Date   TRIG 210 (H) 02/02/2024   Lab Results  Component Value Date   CHOLHDL 3.8 02/02/2024   Lab Results  Component Value Date   HGBA1C 6.0 (H) 02/02/2024      Assessment & Plan:  Primary hypertension Assessment & Plan: Controlled BP Refilled olmesartan 20 mg daily Low sodium diet with increased physical activity  Orders: -     Olmesartan Medoxomil; Take 1 tablet (20 mg total) by mouth daily.  Dispense: 30 tablet; Refill: 1  Neck pain, chronic Assessment & Plan: Reports worsening of her symptoms with physical therapy. Symptoms have not improved with the current regimen. A referral will be placed to orthopedic surgery for collaborative care   Orders: -     Ambulatory referral to Orthopedic Surgery  Migraine without aura and without status migrainosus, not intractable Assessment & Plan: Stable with no complaints    Gastroesophageal reflux disease without esophagitis Assessment & Plan: GERD diet encouraged Refilled protonix  40 mg daily   Orders: -     Pantoprazole  Sodium; Take 1 tablet (40 mg total) by mouth daily.  Dispense: 30  tablet; Refill: 2  IFG (impaired fasting glucose) -     Hemoglobin A1c  Vitamin D  deficiency -     VITAMIN D  25 Hydroxy (Vit-D Deficiency, Fractures)  TSH (thyroid-stimulating hormone deficiency) -     TSH + free T4  Other hyperlipidemia -     Lipid panel -     CMP14+EGFR -     CBC with Differential/Platelet  COPD with asthma (HCC) -     Ventolin  HFA; Inhale 1-2 puffs into the lungs every 6 (six) hours as needed for wheezing or shortness of breath.  Dispense: 18 g; Refill: 2  Note: This chart has been completed using Engineer, civil (consulting) software, and while attempts have been made to ensure accuracy, certain words and phrases may not be transcribed as intended.    Follow-up: Return in about 5 months (around 07/02/2024).   Darcey Cardy  Z Bacchus, FNP

## 2024-02-03 LAB — CBC WITH DIFFERENTIAL/PLATELET
Basophils Absolute: 0.1 x10E3/uL (ref 0.0–0.2)
Basos: 1 %
EOS (ABSOLUTE): 0.2 x10E3/uL (ref 0.0–0.4)
Eos: 2 %
Hematocrit: 48.2 % — ABNORMAL HIGH (ref 34.0–46.6)
Hemoglobin: 15.6 g/dL (ref 11.1–15.9)
Immature Grans (Abs): 0 x10E3/uL (ref 0.0–0.1)
Immature Granulocytes: 0 %
Lymphocytes Absolute: 3 x10E3/uL (ref 0.7–3.1)
Lymphs: 33 %
MCH: 30.8 pg (ref 26.6–33.0)
MCHC: 32.4 g/dL (ref 31.5–35.7)
MCV: 95 fL (ref 79–97)
Monocytes Absolute: 0.4 x10E3/uL (ref 0.1–0.9)
Monocytes: 5 %
Neutrophils Absolute: 5.4 x10E3/uL (ref 1.4–7.0)
Neutrophils: 59 %
Platelets: 397 x10E3/uL (ref 150–450)
RBC: 5.06 x10E6/uL (ref 3.77–5.28)
RDW: 14.1 % (ref 11.7–15.4)
WBC: 9 x10E3/uL (ref 3.4–10.8)

## 2024-02-03 LAB — CMP14+EGFR
ALT: 12 IU/L (ref 0–32)
AST: 14 IU/L (ref 0–40)
Albumin: 4.6 g/dL (ref 3.8–4.9)
Alkaline Phosphatase: 103 IU/L (ref 49–135)
BUN/Creatinine Ratio: 10 (ref 9–23)
BUN: 7 mg/dL (ref 6–24)
Bilirubin Total: 0.3 mg/dL (ref 0.0–1.2)
CO2: 21 mmol/L (ref 20–29)
Calcium: 9.8 mg/dL (ref 8.7–10.2)
Chloride: 102 mmol/L (ref 96–106)
Creatinine, Ser: 0.71 mg/dL (ref 0.57–1.00)
Globulin, Total: 2.7 g/dL (ref 1.5–4.5)
Glucose: 91 mg/dL (ref 70–99)
Potassium: 4 mmol/L (ref 3.5–5.2)
Sodium: 142 mmol/L (ref 134–144)
Total Protein: 7.3 g/dL (ref 6.0–8.5)
eGFR: 103 mL/min/1.73 (ref >59)

## 2024-02-03 LAB — LIPID PANEL
Chol/HDL Ratio: 3.8 ratio (ref 0.0–4.4)
Cholesterol, Total: 190 mg/dL (ref 100–199)
HDL: 50 mg/dL (ref >39)
LDL Chol Calc (NIH): 104 mg/dL — ABNORMAL HIGH (ref 0–99)
Triglycerides: 210 mg/dL — ABNORMAL HIGH (ref 0–149)
VLDL Cholesterol Cal: 36 mg/dL (ref 5–40)

## 2024-02-03 LAB — TSH+FREE T4
Free T4: 0.99 ng/dL (ref 0.82–1.77)
TSH: 2.14 u[IU]/mL (ref 0.450–4.500)

## 2024-02-03 LAB — HEMOGLOBIN A1C
Est. average glucose Bld gHb Est-mCnc: 126 mg/dL
Hgb A1c MFr Bld: 6 % — ABNORMAL HIGH (ref 4.8–5.6)

## 2024-02-03 LAB — VITAMIN D 25 HYDROXY (VIT D DEFICIENCY, FRACTURES): Vit D, 25-Hydroxy: 44.2 ng/mL (ref 30.0–100.0)

## 2024-02-04 ENCOUNTER — Ambulatory Visit: Payer: Self-pay | Admitting: Family Medicine

## 2024-02-04 DIAGNOSIS — K219 Gastro-esophageal reflux disease without esophagitis: Secondary | ICD-10-CM | POA: Insufficient documentation

## 2024-02-04 DIAGNOSIS — E7849 Other hyperlipidemia: Secondary | ICD-10-CM

## 2024-02-04 DIAGNOSIS — I10 Essential (primary) hypertension: Secondary | ICD-10-CM | POA: Insufficient documentation

## 2024-02-04 MED ORDER — ROSUVASTATIN CALCIUM 20 MG PO TABS: 20.0000 mg | ORAL_TABLET | Freq: Every day | ORAL | 3 refills | Status: AC

## 2024-02-04 NOTE — Assessment & Plan Note (Signed)
 Controlled BP Refilled olmesartan 20 mg daily Low sodium diet with increased physical activity

## 2024-02-04 NOTE — Assessment & Plan Note (Signed)
 Reports worsening of her symptoms with physical therapy. Symptoms have not improved with the current regimen. A referral will be placed to orthopedic surgery for collaborative care

## 2024-02-04 NOTE — Assessment & Plan Note (Signed)
 Stable with no complaints

## 2024-02-04 NOTE — Assessment & Plan Note (Signed)
 GERD diet encouraged Refilled protonix  40 mg daily

## 2024-02-06 ENCOUNTER — Telehealth (HOSPITAL_COMMUNITY): Admitting: Registered Nurse

## 2024-02-06 ENCOUNTER — Telehealth (HOSPITAL_COMMUNITY): Payer: Self-pay | Admitting: *Deleted

## 2024-02-06 NOTE — Telephone Encounter (Signed)
 Called patient to resch appt for 02-06-2024 due to provider not in office and was not able to reach patient. Staff could not leave message due to patient voicemail box have not been set up yet.

## 2024-02-13 ENCOUNTER — Ambulatory Visit (INDEPENDENT_AMBULATORY_CARE_PROVIDER_SITE_OTHER): Admitting: Psychiatry

## 2024-02-13 ENCOUNTER — Telehealth: Payer: Self-pay | Admitting: Acute Care

## 2024-02-13 ENCOUNTER — Encounter (HOSPITAL_COMMUNITY): Payer: Self-pay

## 2024-02-13 DIAGNOSIS — F411 Generalized anxiety disorder: Secondary | ICD-10-CM | POA: Diagnosis not present

## 2024-02-13 DIAGNOSIS — Z122 Encounter for screening for malignant neoplasm of respiratory organs: Secondary | ICD-10-CM

## 2024-02-13 DIAGNOSIS — Z87891 Personal history of nicotine dependence: Secondary | ICD-10-CM

## 2024-02-13 DIAGNOSIS — F17210 Nicotine dependence, cigarettes, uncomplicated: Secondary | ICD-10-CM
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
## 2024-02-13 NOTE — Telephone Encounter (Signed)
 Lung Cancer Screening Narrative/Criteria Questionnaire (Cigarette Smokers Only- No Cigars/Pipes/vapes)   Nichole [name]   SDMV:02/17/2024 11:00 [name]       June 05, 1970   LDCT: 02/24/2024 6:00p AP    52 y.o.   Phone: [phone]  Lung Screening Narrative (confirm age 47-77 yrs Medicare / 50-80 yrs Private pay insurance)   Insurance information:mcd   Referring Provider:Gloria Bacchus   This screening involves an initial phone call with a team member from our program. It is called a shared decision making visit. The initial meeting is required by  insurance and Medicare to make sure you understand the program. This appointment takes about 15-20 minutes to complete. You will complete the screening scan at your scheduled date/time.  This scan takes about 5-10 minutes to complete. You can eat and drink normally before and after the scan.  Criteria questions for Lung Cancer Screening:   Are you a current or former smoker? Current Age began smoking: 53yo   If you are a former smoker, what year did you quit smoking? N/A(within 15 yrs)   To calculate your smoking history, I need an accurate estimate of how many packs of cigarettes you smoked per day and for how many years. (Not just the number of PPD you are now smoking)   Years smoking 44 x Packs per day 2 = Pack years 56   (at least 20 pack yrs)   (Make sure they understand that we need to know how much they have smoked in the past, not just the number of PPD they are smoking now)  Do you have a personal history of cancer?  No    Do you have a family history of cancer? Yes  (cancer type and and relative) Father - lung, Brother #1 - lung, Brother #2 - colon, Brother #3 - gliobastoma   Are you coughing up blood?  No  Have you had unexplained weight loss of 15 lbs or more in the last 6 months? No  It looks like you meet all criteria.  When would be a good time for us  to schedule you for this screening?   Additional information: N/A

## 2024-02-16 NOTE — Progress Notes (Signed)
  Virtual Visit via Telephone Note  I connected with Nichole [name] , 02/16/24 9:02 PM by a telemedicine application and verified that I am speaking with the correct person using two identifiers.  Location: Patient: home Provider: home   I discussed the limitations of evaluation and management by telemedicine and the availability of in person appointments. The patient expressed understanding and agreed to proceed.   Shared Decision Making Visit Lung Cancer Screening Program [phone])   Eligibility: 54 y.o. Pack Years Smoking History Calculation = 88 pack years  (# packs/per year x # years smoked) Recent History of coughing up blood  no Unexplained weight loss? no ( >Than 15 pounds within the last 6 months ) Prior History Lung / other cancer no (Diagnosis within the last 5 years already requiring surveillance chest CT Scans). Smoking Status Current Smoker   Visit Components: Discussion included one or more decision making aids. YES Discussion included risk/benefits of screening. YES Discussion included potential follow up diagnostic testing for abnormal scans. YES Discussion included meaning and risk of over diagnosis. YES Discussion included meaning and risk of False Positives. YES Discussion included meaning of total radiation exposure. YES  Counseling Included: Importance of adherence to annual lung cancer LDCT screening. YES Impact of comorbidities on ability to participate in the program. YES Ability and willingness to under diagnostic treatment. YES  Smoking Cessation Counseling: Current Smokers:  Discussed importance of smoking cessation. yes Information about tobacco cessation classes and interventions provided to patient. yes Patient provided with ticket for LDCT Scan. yes Symptomatic Patient. NO Diagnosis Code: Tobacco Use Z72.0 Asymptomatic Patient yes  Counseling - 4 minutes of smoking cessation counseling (CT Chest Lung Cancer Screening Low Dose W/O CM)  [id number]  Z12.2-Screening of respiratory organs Z87.891-Personal history of nicotine dependence   Nichole [name] 02/16/24

## 2024-02-16 NOTE — Patient Instructions (Signed)

## 2024-02-17 ENCOUNTER — Ambulatory Visit (INDEPENDENT_AMBULATORY_CARE_PROVIDER_SITE_OTHER): Admitting: Adult Health

## 2024-02-17 ENCOUNTER — Encounter: Payer: Self-pay | Admitting: Adult Health

## 2024-02-17 DIAGNOSIS — F17210 Nicotine dependence, cigarettes, uncomplicated: Secondary | ICD-10-CM | POA: Diagnosis not present

## 2024-02-22 ENCOUNTER — Ambulatory Visit: Admitting: Dermatology

## 2024-02-22 VITALS — BP 127/86 | HR 113

## 2024-02-22 DIAGNOSIS — L814 Other melanin hyperpigmentation: Secondary | ICD-10-CM

## 2024-02-22 DIAGNOSIS — L821 Other seborrheic keratosis: Secondary | ICD-10-CM | POA: Diagnosis not present

## 2024-02-22 DIAGNOSIS — D1801 Hemangioma of skin and subcutaneous tissue: Secondary | ICD-10-CM | POA: Diagnosis not present

## 2024-02-22 DIAGNOSIS — D229 Melanocytic nevi, unspecified: Secondary | ICD-10-CM

## 2024-02-22 DIAGNOSIS — Z1283 Encounter for screening for malignant neoplasm of skin: Secondary | ICD-10-CM | POA: Diagnosis not present

## 2024-02-22 DIAGNOSIS — B079 Viral wart, unspecified: Secondary | ICD-10-CM

## 2024-02-22 DIAGNOSIS — L578 Other skin changes due to chronic exposure to nonionizing radiation: Secondary | ICD-10-CM

## 2024-02-22 DIAGNOSIS — W908XXA Exposure to other nonionizing radiation, initial encounter: Secondary | ICD-10-CM
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
## 2024-02-22 NOTE — Patient Instructions (Addendum)
 Liquid nitrogen was applied for 10-12 seconds to the skin lesion and the expected blistering or scabbing reaction explained. Do not pick at the area. Patient reminded to expect hypopigmented scars from the procedure. Return if lesion fails to fully resolve.  Important Information  Due to recent changes in healthcare laws, you may see results of your pathology and/or laboratory studies on MyChart before the doctors have had a chance to review them. We understand that in some cases there may be results that are confusing or concerning to you. Please understand that not all results are received at the same time and often the doctors may need to interpret multiple results in order to provide you with the best plan of care or course of treatment. Therefore, we ask that you please give us  2 business days to thoroughly review all your results before contacting the office for clarification. Should we see a critical lab result, you will be contacted sooner.   If You Need Anything After Your Visit  If you have any questions or concerns for your doctor, please call our main line at [phone] If no one answers, please leave a voicemail as directed and we will return your call as soon as possible. Messages left after 4 pm will be answered the following business day.   You may also send us  a message via MyChart. We typically respond to MyChart messages within 1-2 business days.  For prescription refills, please ask your pharmacy to contact our office. Our fax number is [phone].  If you have an urgent issue when the clinic is closed that cannot wait until the next business day, you can page your doctor at the number below.    Please note that while we do our best to be available for urgent issues outside of office hours, we are not available 24/7.   If you have an urgent issue and are unable to reach us , you may choose to seek medical care at your doctor's office, retail clinic, urgent care center, or  emergency room.  If you have a medical emergency, please immediately call 911 or go to the emergency department. In the event of inclement weather, please call our main line at [phone] for an update on the status of any delays or closures.  Dermatology Medication Tips: Please keep the boxes that topical medications come in in order to help keep track of the instructions about where and how to use these. Pharmacies typically print the medication instructions only on the boxes and not directly on the medication tubes.   If your medication is too expensive, please contact our office at [phone] or send us  a message through MyChart.   We are unable to tell what your co-pay for medications will be in advance as this is different depending on your insurance coverage. However, we may be able to find a substitute medication at lower cost or fill out paperwork to get insurance to cover a needed medication.   If a prior authorization is required to get your medication covered by your insurance company, please allow us  1-2 business days to complete this process.  Drug prices often vary depending on where the prescription is filled and some pharmacies may offer cheaper prices.  The website www.goodrx.com contains coupons for medications through different pharmacies. The prices here do not account for what the cost may be with help from insurance (it may be cheaper with your insurance), but the website can give you the price if you did not use  any insurance.  - You can print the associated coupon and take it with your prescription to the pharmacy.  - You may also stop by our office during regular business hours and pick up a GoodRx coupon card.  - If you need your prescription sent electronically to a different pharmacy, notify our office through [facility] or by phone at [phone]

## 2024-02-23 ENCOUNTER — Encounter: Payer: Self-pay | Admitting: Dermatology

## 2024-02-24 ENCOUNTER — Ambulatory Visit (HOSPITAL_COMMUNITY)
Admission: RE | Admit: 2024-02-24 | Discharge: 2024-02-24 | Disposition: A | Discharge status: Home / Self Care | Source: Ambulatory Visit | Attending: Acute Care | Admitting: Acute Care

## 2024-02-24 DIAGNOSIS — Z122 Encounter for screening for malignant neoplasm of respiratory organs: Secondary | ICD-10-CM | POA: Diagnosis present

## 2024-02-24 DIAGNOSIS — Z87891 Personal history of nicotine dependence: Secondary | ICD-10-CM | POA: Insufficient documentation

## 2024-02-24 DIAGNOSIS — F17210 Nicotine dependence, cigarettes, uncomplicated: Secondary | ICD-10-CM | POA: Insufficient documentation

## 2024-02-28 ENCOUNTER — Other Ambulatory Visit: Payer: Self-pay | Admitting: Family Medicine

## 2024-02-28 DIAGNOSIS — E559 Vitamin D deficiency, unspecified: Secondary | ICD-10-CM

## 2024-02-29 ENCOUNTER — Telehealth: Payer: Self-pay

## 2024-02-29 ENCOUNTER — Telehealth: Payer: Self-pay | Admitting: Acute Care

## 2024-02-29 NOTE — Telephone Encounter (Signed)
 6 month follow up on this is fine.  Aortic atherosclerosis>>She is on statin  Mild emphysema Please fax results to PCP and let them know plan. Thanks so much

## 2024-02-29 NOTE — Telephone Encounter (Signed)
 Call report from [facility]:  IMPRESSION: Lung-RADS 3, probably benign findings. While the lesion in question does demonstrate stability since prior examination, this represents interval of 4 months. Short-term follow-up in 6 months is recommended with repeat low-dose chest CT without contrast (please use the following order, CT CHEST LCS NODULE FOLLOW-UP W/O CM).   Mild emphysema   Aortic Atherosclerosis (ICD10-I70.0) and Emphysema (ICD10-J43.9).

## 2024-02-29 NOTE — Telephone Encounter (Signed)
 Called patient to review results. No VM setup, message sent to mychart.  IMPRESSION: Lung-RADS 3, probably benign findings. While the lesion in question does demonstrate stability since prior examination, this represents interval of 4 months. Short-term follow-up in 6 months is recommended with repeat low-dose chest CT without contrast (please use the following order, CT CHEST LCS NODULE FOLLOW-UP W/O CM).   Mild emphysema   Aortic Atherosclerosis (ICD10-I70.0) and Emphysema (ICD10-J43.9).

## 2024-03-01 ENCOUNTER — Telehealth (HOSPITAL_COMMUNITY): Admitting: Registered Nurse

## 2024-03-01 ENCOUNTER — Other Ambulatory Visit: Payer: Self-pay

## 2024-03-01 ENCOUNTER — Encounter (HOSPITAL_COMMUNITY): Payer: Self-pay | Admitting: Registered Nurse

## 2024-03-01 DIAGNOSIS — R911 Solitary pulmonary nodule: Secondary | ICD-10-CM

## 2024-03-01 DIAGNOSIS — F411 Generalized anxiety disorder: Secondary | ICD-10-CM | POA: Diagnosis not present

## 2024-03-01 DIAGNOSIS — F3130 Bipolar disorder, current episode depressed, mild or moderate severity, unspecified: Secondary | ICD-10-CM | POA: Diagnosis not present

## 2024-03-01 DIAGNOSIS — F17210 Nicotine dependence, cigarettes, uncomplicated: Secondary | ICD-10-CM

## 2024-03-01 DIAGNOSIS — Z122 Encounter for screening for malignant neoplasm of respiratory organs: Secondary | ICD-10-CM

## 2024-03-01 DIAGNOSIS — Z87891 Personal history of nicotine dependence: Secondary | ICD-10-CM

## 2024-03-01 MED ORDER — BUSPIRONE HCL 10 MG PO TABS: 10.0000 mg | ORAL_TABLET | Freq: Three times a day (TID) | ORAL | 1 refills | Status: DC

## 2024-03-01 MED ORDER — HYDROXYZINE HCL 10 MG PO TABS: ORAL_TABLET | ORAL | 1 refills | Status: DC

## 2024-03-01 MED ORDER — CARIPRAZINE HCL 4.5 MG PO CAPS: 4.5000 mg | ORAL_CAPSULE | Freq: Every day | ORAL | 1 refills | Status: DC

## 2024-03-01 NOTE — Progress Notes (Signed)
 [facility] MD/PA/NP OP Progress Note  03/01/2024 3:06 PM Nichole [name]  MRN:  [id number]  Virtual Visit via Video Note  I connected with Nichole [name] on 03/01/24 at  2:30 PM EDT by a video enabled telemedicine application and verified that I am speaking with the correct person using two identifiers.  Location: Patient: Home Provider: Home office   I discussed the limitations of evaluation and management by telemedicine and the availability of in person appointments. The patient expressed understanding and agreed to proceed.  I discussed the assessment and treatment plan with the patient. The patient was provided an opportunity to ask questions and all were answered. The patient agreed with the plan and demonstrated an understanding of the instructions.   The patient was advised to call back or seek an in-person evaluation if the symptoms worsen or if the condition fails to improve as anticipated.  I provided 40 minutes of non-face-to-face time during this encounter.   Luisa Ruder, NP   Chief Complaint:  Chief Complaint  Patient presents with   Follow-up    Medication management   HPI: Nichole [name] 53 y.o. female presents today for medication management follow up.  She is seen via virtual video visit by this provider, and chart reviewed on 03/01/24.  Her psychiatric history is significant for Bipolar 1 disorder, PTSD, manic depression, general anxiety, and panic attack.  Her mental health is currently managed with BuSpar  7.5 mg 3 times daily, Vraylar  3 mg daily, and Vistaril  10-20 mg 3 times daily as needed.  She reports current medications were working effectively and managing her mental health without any adverse reaction but with the recent stressors she feels that she is at a standstill and some worsening of her depression and anxiety.  She reports her husband had another heart attack the fourth of this month (02/04/2024) and now they are saying that he needs to have a heart  transplant.  Things have been pretty rough.  She reports sleep has been better with medication adjustments.  Reports she is eating without any difficulty.  She reports she has had some passive suicidal thoughts with no intent or plan.  Reports the threats are like wanting to go to sleep and not wake up or just wanting everything to be over.  I would never try to kill myself.  At this time she denies active/passive suicidal ideation, self-harm/homicidal ideation, psychosis, paranoia, and abnormal movement.  Screenings completed during today's visit C-SSRS, AIMS, Nutrition, and Pain, see scores below.  Recommendation: Increase Vraylar  4.5 mg daily, increase BuSpar  10 mg times daily, and continue Vistaril  10-20 mg 3 times daily as needed.  She voices understanding/ agreement with information/recommendations being given to her today.    Visit Diagnosis:    ICD-10-CM   1. Bipolar disorder, most recent episode depressed (HCC)  F31.30 Cariprazine  HCl (VRAYLAR ) 4.5 MG CAPS    2. Generalized anxiety disorder  F41.1 Cariprazine  HCl (VRAYLAR ) 4.5 MG CAPS    busPIRone  (BUSPAR ) 10 MG tablet    hydrOXYzine  (ATARAX ) 10 MG tablet     Past Psychiatric History: Bipolar 1 disorder, PTSD, manic depression, general anxiety, and panic attack  Previous Psychotropic Medications: Yes , but unable to give the names of medications other than Xanax  and Klonopin  Past Medical History:  Past Medical History:  Diagnosis Date   Arthritis    Asthma    Bipolar 1 disorder (HCC)    COPD (chronic obstructive pulmonary disease) (HCC)    COPD (chronic  obstructive pulmonary disease) (HCC)    Heart attack (HCC) 2012   stress induced   Manic depression (HCC)    Panic attacks    Sciatica     Past Surgical History:  Procedure Laterality Date   BIOPSY  02/10/2023   Procedure: BIOPSY;  Surgeon: Cinderella Deatrice FALCON, MD;  [facility];  Service: Endoscopy;;   CESAREAN SECTION     COLONOSCOPY WITH PROPOFOL  N/A  02/10/2023   Procedure: COLONOSCOPY WITH PROPOFOL ;  Surgeon: Cinderella Deatrice FALCON, MD;  Location: [facility];  Service: Endoscopy;  Laterality: N/A;  7:30am;asa 3   cyst removed from wrist     ESOPHAGEAL DILATION  02/10/2023   Procedure: ESOPHAGEAL DILATION;  Surgeon: Cinderella Deatrice FALCON, MD;  [facility]: [facility];  Service: Endoscopy;;   ESOPHAGOGASTRODUODENOSCOPY (EGD) WITH PROPOFOL  N/A 02/10/2023   Procedure: ESOPHAGOGASTRODUODENOSCOPY (EGD) WITH PROPOFOL ;  Surgeon: Cinderella Deatrice FALCON, MD;  [facility];  Service: Endoscopy;  Laterality: N/A;  7:30am;asa 3   POLYPECTOMY  02/10/2023   Procedure: POLYPECTOMY;  Surgeon: Cinderella Deatrice FALCON, MD;  [facility];  Service: Endoscopy;;   TONSILLECTOMY     TUBAL LIGATION      Family Psychiatric History: Reports a significant history of mental illness in family but she is unaware of who has what.    Family History:  Family History  Problem Relation Age of Onset   Heart disease Mother    Mental illness Mother    Hypertension Mother    Alcohol abuse Father    Lung disease Father    Heart disease Father    Lung cancer Father    Cancer Brother    Colon cancer Brother    Skin cancer Brother    Lung cancer Brother    Schizophrenia Maternal Aunt    Breast cancer Maternal Grandmother    Thyroid cancer Maternal Grandmother    Depression Son    Anxiety disorder Son     Social History:  Social History   Socioeconomic History   Marital status: Married    Spouse name: Not on file   Number of children: Not on file   Years of education: Not on file   Highest education level: Not on file  Occupational History   Not on file  Tobacco Use   Smoking status: Every Day    Current packs/day: 2.00    Average packs/day: 2.0 packs/day for 44.3 years (88.5 ttl pk-yrs)    Types: Cigarettes    Start date: 11/28/1979    Passive exposure: Current   Smokeless tobacco: Never  Substance and Sexual Activity   Alcohol use: Not  Currently    Comment: occ   Drug use: No   Sexual activity: Yes    Partners: Male    Birth control/protection: None  Other Topics Concern   Not on file  Social History Narrative   Not on file   Social Drivers of Health   Financial Resource Strain: Not on file  Food Insecurity: Not on file  Transportation Needs: Not on file  Physical Activity: Not on file  Stress: Not on file  Social Connections: Not on file    Allergies:  Allergies  Allergen Reactions   Bee Venom Anaphylaxis and Hives   Alka-Seltzer [Aspirin  Effervescent]    Percocet [Oxycodone-Acetaminophen ]     headache    Metabolic Disorder Labs: Lab Results  Component Value Date   HGBA1C 6.0 (H) 02/02/2024   No results found for: PROLACTIN Lab  Results  Component Value Date   CHOL 190 02/02/2024   TRIG 210 (H) 02/02/2024   HDL 50 02/02/2024   CHOLHDL 3.8 02/02/2024   LDLCALC 104 (H) 02/02/2024   LDLCALC 103 (H) 10/05/2023   Lab Results  Component Value Date   TSH 2.140 02/02/2024   TSH 1.790 10/05/2023    Current Medications: Current Outpatient Medications  Medication Sig Dispense Refill   baclofen  (LIORESAL ) 10 MG tablet TAKE 1 TABLET BY MOUTH THREE TIMES DAILY 30 tablet 2   BREZTRI  AEROSPHERE 160-9-4.8 MCG/ACT AERO inhaler Inhale 2 puffs by mouth twice daily 11 g 0   busPIRone  (BUSPAR ) 10 MG tablet Take 1 tablet (10 mg total) by mouth 3 (three) times daily. 60 tablet 1   Cariprazine  HCl (VRAYLAR ) 4.5 MG CAPS Take 1 capsule (4.5 mg total) by mouth daily. 30 capsule 1   diphenhydrAMINE  (BENADRYL ) 25 mg capsule Take 25 mg by mouth as needed for itching.     EPINEPHrine  (EPIPEN  2-PAK) 0.3 mg/0.3 mL IJ SOAJ injection Inject 0.3 mg into the muscle as needed for anaphylaxis. 1 each 0   hydrOXYzine  (ATARAX ) 10 MG tablet Tale 10-20 mg (1-2 tablets) three times daily as needed for anxiety/sleep 60 tablet 1   NURTEC 75 MG TBDP DISSOLVE 1 TABLET BY MOUTH AS NEEDED 16 tablet 0   olmesartan (BENICAR) 20 MG  tablet Take 1 tablet (20 mg total) by mouth daily. 30 tablet 1   pantoprazole  (PROTONIX ) 40 MG tablet Take 1 tablet (40 mg total) by mouth daily. 30 tablet 2   rosuvastatin  (CRESTOR ) 20 MG tablet Take 1 tablet (20 mg total) by mouth daily. 90 tablet 3   VENTOLIN  HFA 108 (90 Base) MCG/ACT inhaler Inhale 1-2 puffs into the lungs every 6 (six) hours as needed for wheezing or shortness of breath. 18 g 2   Vitamin D , Ergocalciferol , (DRISDOL ) 1.25 MG (50000 UNIT) CAPS capsule Take 1 capsule (50,000 Units total) by mouth every 7 (seven) days. 20 capsule 1   No current facility-administered medications for this visit.    Musculoskeletal: Strength & Muscle Tone: within normal limits Gait & Station: normal Patient leans: N/A  Psychiatric Specialty Exam: Review of Systems  Constitutional:        No complaints voiced at this time  Psychiatric/Behavioral:  Positive for dysphoric mood and sleep disturbance (Improvement with medication). Negative for hallucinations, self-injury and suicidal ideas. Agitation: Irritability.The patient is nervous/anxious.   All other systems reviewed and are negative.   There were no vitals taken for this visit.There is no height or weight on file to calculate BMI.  General Appearance: Casual  Eye Contact:  Good  Speech:  Clear and Coherent and Normal Rate  Volume:  Normal  Mood:  Anxious and Depressed  Affect:  Appropriate and Congruent  Thought Process:  Coherent, Goal Directed, and Descriptions of Associations: Intact  Orientation:  Full (Time, Place, and Person)  Thought Content: Logical   Suicidal Thoughts:  No, denies active and passive suicidal thoughts at this time reports they have been some passive thoughts like going to sleep and not waking up wishing everything was over.  Homicidal Thoughts:  No  Memory:  Immediate;   Good Recent;   Good Remote;   Good  Judgement:  Intact  Insight:  Present  Psychomotor Activity:  Normal  Concentration:   Concentration: Good and Attention Span: Good  Recall:  Good  Fund of Knowledge: Good  Language: Good  Akathisia:  No  Handed:  Right  AIMS (if indicated): done  Assets:  Communication Skills Desire for Improvement Financial Resources/Insurance Housing Leisure Time Resilience Social Support Transportation  ADL's:  Intact  Cognition: WNL  Sleep:  Good, has improved with medication   Screenings: AIMS    Flowsheet Row Video Visit from 03/01/2024 in [facility] [place] Office Visit from 10/24/2023 in [facility] at [place]  AIMS Total Score 0 0   GAD-7    Flowsheet Row Counselor from 02/13/2024 in [facility] [place] Office Visit from 02/02/2024 in [facility] Primary Care Counselor from 01/17/2024 in [facility] at [place] Office Visit from 10/24/2023 in [facility] at [place] Office Visit from 10/03/2023 in [facility] Primary Care  Total GAD-7 Score 19 13 18 15 16    PHQ2-9    Flowsheet Row Counselor from 02/13/2024 in [facility] at [place] Office Visit from 02/02/2024 in [facility] Primary Care Counselor from 01/17/2024 in [facility] at [place] Office Visit from 10/24/2023 in [facility] [place] Office Visit from 10/03/2023 in [facility] Primary Care  PHQ-2 Total Score 4 3 4 4 5   PHQ-9 Total Score 12 11 15 15 16    Flowsheet Row Video Visit from 03/01/2024 in [facility] at [place] Counselor from 01/17/2024 in [facility] at [place] Office Visit from 10/24/2023 in [facility] at [place]  C-SSRS RISK CATEGORY Moderate Risk Moderate Risk Moderate Risk   Assessment and Plan:  Assessment: Summary: Today Nichole [name] reports current medication regimen was effectively managing her mental health without any adverse reactions until recent stressor of her husband's heart attack and now recommending him for heart transplant.  Reports she is eating without difficulty.  She also states that her sleep has improved after last medication adjustment.  She reported some passive suicidal thoughts of wanting to sleep and not waking up or just wishing things were over.  Reports no intent or plan and states she would never attempt to kill herself.  Today she denies suicidal/self-harm/homicidal ideation, psychosis, paranoia, and abnormal movement During visit she is dressed appropriate for age and weather.  She is seated comfortably in interview of camera with no noted distress.  She is alert/oriented x 4, calm/cooperative and mood is congruent with affect.  She spoke in a clear tone at moderate volume, and normal pace, with good eye contact.  Her thought process is coherent, relevant, and there is no indication that she is currently responding to internal/external stimuli or experiencing delusional thought content.    1. Generalized anxiety disorder - Cariprazine  HCl (VRAYLAR ) 4.5 MG CAPS; Take 1 capsule (4.5 mg total) by mouth daily.  Dispense: 30 capsule; Refill: 1 - busPIRone  (BUSPAR ) 10 MG tablet; Take 1 tablet (10 mg total) by mouth 3 (three) times daily.  Dispense: 60 tablet; Refill: 1 - hydrOXYzine  (ATARAX ) 10 MG tablet; Tale 10-20 mg (1-2 tablets) three times daily as needed for anxiety/sleep  Dispense: 60 tablet; Refill: 1  2. Bipolar disorder, most recent episode depressed (HCC) (Primary) - Cariprazine  HCl (VRAYLAR ) 4.5 MG CAPS; Take 1 capsule (4.5 mg total) by mouth daily.  Dispense: 30 capsule; Refill: 1   Plan: Medications: Meds ordered this encounter  Medications   Cariprazine  HCl (VRAYLAR ) 4.5 MG CAPS    Sig: Take 1 capsule (4.5 mg total) by mouth  daily.    Dispense:   30 capsule    Refill:  1    Supervising Provider:   CURRY, SYED T [[id number]]   busPIRone  (BUSPAR ) 10 MG tablet    Sig: Take 1 tablet (10 mg total) by mouth 3 (three) times daily.    Dispense:  60 tablet    Refill:  1    Supervising Provider:   CURRY, SYED T [[id number]]   hydrOXYzine  (ATARAX ) 10 MG tablet    Sig: Tale 10-20 mg (1-2 tablets) three times daily as needed for anxiety/sleep    Dispense:  60 tablet    Refill:  1    Supervising Provider:   CURRY PATERSON T [[id number]]   Labs:  Not indicated at this time  Other:  Counseling/therapy: Continue services with Winton Rubinstein, LCSW.   Nichole [name] is instructed to continue established safety plan Safety Plan Nichole [name] will reach out to [name]), call [phone], mobile crisis or present to the nearest emergency room should she experiences any suicidal/homicidal ideation, auditory/visual/hallucinations, or detrimental worsening of her mental health.  Nichole [name] will follow up with Luisa Ruder, NP for medication management and Winton Rubinstein, LCSW for counseling/therapy.      The suicide prevention education provided includes the following: Suicide risk factors Suicide prevention and interventions National Suicide Hotline telephone number [facility] assessment telephone number [facility] Emergency Assistance 911 [facility] and/or Residential Mobile Crisis Unit telephone number Request made to [place] to share with husband Remove weapons (e.g., guns, rifles, knives), all items previously/currently identified as safety concern.   Remove drugs/medications (over the counter, prescriptions, illicit drugs), all items previously/currently identified as a safety concern. She is encouraged/advised to reduce cigarette and to consider quitting Nichole [name] participated in the development of this treatment plan and verbalized her understanding/agreement with plan as listed.  Follow Up: Return in 1 months for medication  management Call in the interim for any side-effects, decompensation, questions, or problems  Collaboration of Care: Collaboration of Care: Medication Management AEB medication assessment, adjustment, and refills  Patient/Guardian was advised Release of Information must be obtained prior to any record release in order to collaborate their care with an outside provider. Patient/Guardian was advised if they have not already done so to contact the registration department to sign all necessary forms in order for us  to release information regarding their care.   Consent: Patient/Guardian gives verbal consent for treatment and assignment of benefits for services provided during this visit. Patient/Guardian expressed understanding and agreed to proceed.    Babetta Paterson, NP 03/01/2024, 3:06 PM

## 2024-03-01 NOTE — Patient Instructions (Signed)

## 2024-03-01 NOTE — Telephone Encounter (Signed)
 See provider note from 02/29/2024

## 2024-03-01 NOTE — Telephone Encounter (Signed)
 Spoke with patient and reviewed recent Lung CT results. She will complete a 6 month follow up scan scheduled for 08/28/2023 at [facility]. Results and plan have been sent to her PCP. Order placed. Reminder set for follow up.

## 2024-03-22 ENCOUNTER — Ambulatory Visit (INDEPENDENT_AMBULATORY_CARE_PROVIDER_SITE_OTHER): Admitting: Psychiatry

## 2024-03-22 DIAGNOSIS — F411 Generalized anxiety disorder: Secondary | ICD-10-CM | POA: Diagnosis not present
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
## 2024-04-04 ENCOUNTER — Other Ambulatory Visit: Payer: Self-pay | Admitting: Family Medicine

## 2024-04-04 DIAGNOSIS — I10 Essential (primary) hypertension: Secondary | ICD-10-CM

## 2024-04-05 ENCOUNTER — Ambulatory Visit (INDEPENDENT_AMBULATORY_CARE_PROVIDER_SITE_OTHER): Admitting: Psychiatry

## 2024-04-05 DIAGNOSIS — F411 Generalized anxiety disorder: Secondary | ICD-10-CM
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
## 2024-04-17 ENCOUNTER — Ambulatory Visit: Admitting: Orthopedic Surgery

## 2024-04-19 ENCOUNTER — Ambulatory Visit (HOSPITAL_COMMUNITY): Admitting: Psychiatry

## 2024-04-20 ENCOUNTER — Other Ambulatory Visit: Payer: Self-pay

## 2024-04-20 DIAGNOSIS — G43009 Migraine without aura, not intractable, without status migrainosus: Secondary | ICD-10-CM

## 2024-04-25 ENCOUNTER — Encounter: Payer: Self-pay | Admitting: Orthopedic Surgery

## 2024-04-25 ENCOUNTER — Ambulatory Visit: Admitting: Orthopedic Surgery

## 2024-04-25 VITALS — BP 148/95 | HR 112 | Ht 66.0 in | Wt 188.0 lb

## 2024-04-25 DIAGNOSIS — M47812 Spondylosis without myelopathy or radiculopathy, cervical region: Secondary | ICD-10-CM | POA: Diagnosis not present

## 2024-04-25 DIAGNOSIS — M542 Cervicalgia: Secondary | ICD-10-CM

## 2024-04-25 NOTE — Progress Notes (Signed)
 New Patient Visit  Summary: Nichole [name] is a 53 y.o. female with the following: There are no diagnoses linked to this encounter.  Assessment and Plan Assessment & Plan Cervical spondylosis with chronic neck pain Chronic neck pain unresponsive to conservative management, arthritic changes on radiographs, no neurological deficits. - Ordered cervical spine MRI to evaluate underlying pathology. - Advised follow-up if no MRI scheduling within weeks. - Explained MRI may guide options like injections or spine specialist referral. - Reassured MRI findings do not always lead to surgery.     Follow-up: Return for After MRI.  Subjective:  Chief Complaint  Patient presents with   Neck Pain    Pain and knots for over a yr. Getting worse and pain gets so bad it causes migraines.      Discussed the use of AI scribe software for clinical note transcription with the patient, who gave verbal consent to proceed.  History of Present Illness Nichole [name] is a 53 year old female with cervical spondylosis who presents with worsening chronic neck pain.  Neck pain has progressively worsened over the past year, is severe, localized to the neck and upper shoulders, and is associated with crepitus on head movement without significant radiation to the arms. Neck pain often triggers or worsens her migraines.  Cervical spine radiographs in February showed degenerative changes. Muscle relaxants have not improved her neck pain, though she continues them for sciatica. Physical therapy was stopped before completion because it increased her pain.  Per the therapist, exercises were worsening the pain.  Goody powder was ineffective, and she has not tried acetaminophen  or NSAIDs for this problem.  She denies balance disturbances. She notes occasional mild bilateral hand tremor without changes in strength or sensation. She is right hand dominant.    Review of Systems: No fevers or chills No numbness or  tingling No chest pain No shortness of breath No bowel or bladder dysfunction No GI distress No headaches   Medical History:  Past Medical History:  Diagnosis Date   Arthritis    Asthma    Bipolar 1 disorder (HCC)    COPD (chronic obstructive pulmonary disease) (HCC)    COPD (chronic obstructive pulmonary disease) (HCC)    Heart attack (HCC) 2012   stress induced   Manic depression (HCC)    Panic attacks    Sciatica     Past Surgical History:  Procedure Laterality Date   BIOPSY  02/10/2023   Procedure: BIOPSY;  Surgeon: Cinderella Deatrice FALCON, MD;  [facility]: [facility];  Service: Endoscopy;;   CESAREAN SECTION     COLONOSCOPY WITH PROPOFOL  N/A 02/10/2023   Procedure: COLONOSCOPY WITH PROPOFOL ;  Surgeon: Cinderella Deatrice FALCON, MD;  Location: [facility];  Service: Endoscopy;  Laterality: N/A;  7:30am;asa 3   cyst removed from wrist     ESOPHAGEAL DILATION  02/10/2023   Procedure: ESOPHAGEAL DILATION;  Surgeon: Cinderella Deatrice FALCON, MD;  Location: [facility];  Service: Endoscopy;;   ESOPHAGOGASTRODUODENOSCOPY (EGD) WITH PROPOFOL  N/A 02/10/2023   Procedure: ESOPHAGOGASTRODUODENOSCOPY (EGD) WITH PROPOFOL ;  Surgeon: Cinderella Deatrice FALCON, MD;  Location: [facility];  Service: Endoscopy;  Laterality: N/A;  7:30am;asa 3   POLYPECTOMY  02/10/2023   Procedure: POLYPECTOMY;  Surgeon: Cinderella Deatrice FALCON, MD;  Location: [facility];  Service: Endoscopy;;   TONSILLECTOMY     TUBAL LIGATION      Family History  Problem Relation Age of Onset   Heart disease Mother    Mental  illness Mother    Hypertension Mother    Alcohol abuse Father    Lung disease Father    Heart disease Father    Lung cancer Father    Cancer Brother    Colon cancer Brother    Skin cancer Brother    Lung cancer Brother    Schizophrenia Maternal Aunt    Breast cancer Maternal Grandmother    Thyroid cancer Maternal Grandmother    Depression Son    Anxiety disorder Son    Social  History[1]  Allergies[2]  Active Medications[3]  Objective: BP (!) 148/95   Pulse (!) 112   Ht 5' 6 (1.676 m)   Wt 188 lb (85.3 kg)   BMI 30.34 kg/m   Physical Exam:    General: Alert and oriented. and No acute distress. Gait: Normal gait.  Physical Exam Tenderness within the midline of the upper neck area.  Restricted rotation bilaterally.  Slightly restricted flexion and extension.  She has full range of motion bilateral shoulders.  Good strength in bilateral upper extremities.  Negative Hoffmann.  Excellent grip strength.  Sensation intact throughout bilateral upper extremities.    IMAGING: I personally reviewed images previously obtained in clinic   X-rays of the cervical spine were previously obtained.  These are available in clinic today.  No acute injuries.  No anterolisthesis.  Diffuse degenerative changes, prominent at C2-3 and C3-4.     New Medications:  No orders of the defined types were placed in this encounter.     Portions of this note were completed via Scientist, clinical (histocompatibility and immunogenetics).  Oneil DELENA Horde, MD  04/25/2024 9:35 AM      [1]  Social History Tobacco Use   Smoking status: Every Day    Current packs/day: 2.00    Average packs/day: 2.0 packs/day for 44.4 years (88.8 ttl pk-yrs)    Types: Cigarettes    Start date: 11/28/1979    Passive exposure: Current   Smokeless tobacco: Never  Substance Use Topics   Alcohol use: Not Currently    Comment: occ   Drug use: No  [2]  Allergies Allergen Reactions   Bee Venom Anaphylaxis and Hives   Alka-Seltzer [Aspirin  Effervescent]    Percocet [Oxycodone-Acetaminophen ]     headache  [3]  Current Meds  Medication Sig   baclofen  (LIORESAL ) 10 MG tablet TAKE 1 TABLET BY MOUTH THREE TIMES DAILY   BREZTRI  AEROSPHERE 160-9-4.8 MCG/ACT AERO inhaler Inhale 2 puffs by mouth twice daily   busPIRone  (BUSPAR ) 10 MG tablet Take 1 tablet (10 mg total) by mouth 3 (three) times daily.   Cariprazine  HCl  (VRAYLAR ) 4.5 MG CAPS Take 1 capsule (4.5 mg total) by mouth daily.   diphenhydrAMINE  (BENADRYL ) 25 mg capsule Take 25 mg by mouth as needed for itching.   EPINEPHrine  (EPIPEN  2-PAK) 0.3 mg/0.3 mL IJ SOAJ injection Inject 0.3 mg into the muscle as needed for anaphylaxis.   hydrOXYzine  (ATARAX ) 10 MG tablet Tale 10-20 mg (1-2 tablets) three times daily as needed for anxiety/sleep   olmesartan  (BENICAR ) 20 MG tablet Take 1 tablet by mouth once daily   pantoprazole  (PROTONIX ) 40 MG tablet Take 1 tablet (40 mg total) by mouth daily.   Rimegepant Sulfate (NURTEC) 75 MG TBDP DISSOLVE 1 TABLET BY MOUTH AS NEEDED   rosuvastatin  (CRESTOR ) 20 MG tablet Take 1 tablet (20 mg total) by mouth daily.   VENTOLIN  HFA 108 (90 Base) MCG/ACT inhaler Inhale 1-2 puffs into the lungs every 6 (six) hours as needed  for wheezing or shortness of breath.   Vitamin D , Ergocalciferol , (DRISDOL ) 1.25 MG (50000 UNIT) CAPS capsule Take 1 capsule by mouth once a week

## 2024-04-25 NOTE — Addendum Note (Signed)
 Addended by: VICENTA EMMIE HERO on: 04/25/2024 09:44 AM   Modules accepted: Orders

## 2024-05-08 ENCOUNTER — Ambulatory Visit (HOSPITAL_COMMUNITY): Admitting: Psychiatry

## 2024-05-08 DIAGNOSIS — F411 Generalized anxiety disorder: Secondary | ICD-10-CM
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
## 2024-05-16 ENCOUNTER — Other Ambulatory Visit (HOSPITAL_COMMUNITY): Payer: Self-pay | Admitting: Registered Nurse

## 2024-05-16 ENCOUNTER — Ambulatory Visit (HOSPITAL_COMMUNITY)
Admission: RE | Admit: 2024-05-16 | Discharge: 2024-05-16 | Disposition: A | Discharge status: Home / Self Care | Source: Ambulatory Visit | Attending: Orthopedic Surgery | Admitting: Orthopedic Surgery

## 2024-05-16 ENCOUNTER — Telehealth (HOSPITAL_COMMUNITY): Payer: Self-pay

## 2024-05-16 DIAGNOSIS — M542 Cervicalgia: Secondary | ICD-10-CM | POA: Insufficient documentation

## 2024-05-16 DIAGNOSIS — F3130 Bipolar disorder, current episode depressed, mild or moderate severity, unspecified: Secondary | ICD-10-CM

## 2024-05-16 DIAGNOSIS — F411 Generalized anxiety disorder: Secondary | ICD-10-CM

## 2024-05-16 MED ORDER — VRAYLAR 4.5 MG PO CAPS: 4.5000 mg | ORAL_CAPSULE | Freq: Every day | ORAL | 0 refills | Status: DC

## 2024-05-16 NOTE — Telephone Encounter (Signed)
 [facility] in [place] faxed over a refill request for pt's vraylar  4.5 mg cap. Pt scheduled 05/28/24. Please advise.

## 2024-05-28 ENCOUNTER — Other Ambulatory Visit: Payer: Self-pay | Admitting: Family Medicine

## 2024-05-28 ENCOUNTER — Ambulatory Visit (HOSPITAL_COMMUNITY): Admitting: Registered Nurse

## 2024-05-28 DIAGNOSIS — I10 Essential (primary) hypertension: Secondary | ICD-10-CM

## 2024-05-28 DIAGNOSIS — E559 Vitamin D deficiency, unspecified: Secondary | ICD-10-CM

## 2024-06-04 ENCOUNTER — Encounter (HOSPITAL_COMMUNITY): Payer: Self-pay | Admitting: Registered Nurse

## 2024-06-04 ENCOUNTER — Telehealth (HOSPITAL_COMMUNITY): Admitting: Registered Nurse

## 2024-06-04 DIAGNOSIS — F411 Generalized anxiety disorder: Secondary | ICD-10-CM

## 2024-06-04 DIAGNOSIS — F3130 Bipolar disorder, current episode depressed, mild or moderate severity, unspecified: Secondary | ICD-10-CM

## 2024-06-04 MED ORDER — HYDROXYZINE HCL 10 MG PO TABS: ORAL_TABLET | ORAL | 1 refills | Status: AC

## 2024-06-04 MED ORDER — BUSPIRONE HCL 10 MG PO TABS: 10.0000 mg | ORAL_TABLET | Freq: Three times a day (TID) | ORAL | 1 refills | Status: AC

## 2024-06-04 MED ORDER — VRAYLAR 4.5 MG PO CAPS: 4.5000 mg | ORAL_CAPSULE | Freq: Every day | ORAL | 1 refills | Status: AC

## 2024-06-28 ENCOUNTER — Ambulatory Visit: Admitting: Family Medicine

## 2024-07-03 ENCOUNTER — Ambulatory Visit (HOSPITAL_COMMUNITY): Admitting: Psychiatry

## 2024-08-27 ENCOUNTER — Ambulatory Visit (HOSPITAL_COMMUNITY)

## 2024-11-26 ENCOUNTER — Ambulatory Visit (HOSPITAL_COMMUNITY): Admitting: Registered Nurse

## 2025-02-25 ENCOUNTER — Ambulatory Visit: Admitting: Physician Assistant
